# Patient Record
Sex: Male | Born: 1970 | Race: White | Hispanic: No | Smoking: Current every day smoker
Health system: Southern US, Community
[De-identification: ages and names within clinical notes are randomized; demographics above are authoritative.]

## PROBLEM LIST (undated history)

## (undated) DIAGNOSIS — I10 Essential (primary) hypertension: Secondary | ICD-10-CM

## (undated) DIAGNOSIS — I639 Cerebral infarction, unspecified: Secondary | ICD-10-CM

## (undated) HISTORY — PX: TONSILLECTOMY: SUR1361

## (undated) HISTORY — PX: HAND SURGERY: SHX662

---

## 2021-01-01 ENCOUNTER — Other Ambulatory Visit: Payer: Self-pay

## 2021-01-01 ENCOUNTER — Inpatient Hospital Stay (HOSPITAL_COMMUNITY)
Admission: EM | Admit: 2021-01-01 | Discharge: 2021-01-05 | DRG: 305 | Disposition: A | Discharge status: Home / Self Care | Payer: No Typology Code available for payment source | Attending: Internal Medicine | Admitting: Internal Medicine

## 2021-01-01 ENCOUNTER — Emergency Department (HOSPITAL_COMMUNITY): Payer: No Typology Code available for payment source

## 2021-01-01 ENCOUNTER — Encounter (HOSPITAL_COMMUNITY): Payer: Self-pay | Admitting: Internal Medicine

## 2021-01-01 DIAGNOSIS — Z79899 Other long term (current) drug therapy: Secondary | ICD-10-CM | POA: Diagnosis not present

## 2021-01-01 DIAGNOSIS — Z7289 Other problems related to lifestyle: Secondary | ICD-10-CM | POA: Diagnosis not present

## 2021-01-01 DIAGNOSIS — Z720 Tobacco use: Secondary | ICD-10-CM

## 2021-01-01 DIAGNOSIS — I169 Hypertensive crisis, unspecified: Secondary | ICD-10-CM | POA: Diagnosis not present

## 2021-01-01 DIAGNOSIS — F1990 Other psychoactive substance use, unspecified, uncomplicated: Secondary | ICD-10-CM

## 2021-01-01 DIAGNOSIS — T50916A Underdosing of multiple unspecified drugs, medicaments and biological substances, initial encounter: Secondary | ICD-10-CM | POA: Diagnosis present

## 2021-01-01 DIAGNOSIS — I159 Secondary hypertension, unspecified: Secondary | ICD-10-CM

## 2021-01-01 DIAGNOSIS — I4581 Long QT syndrome: Secondary | ICD-10-CM | POA: Diagnosis present

## 2021-01-01 DIAGNOSIS — F1090 Alcohol use, unspecified, uncomplicated: Secondary | ICD-10-CM

## 2021-01-01 DIAGNOSIS — I160 Hypertensive urgency: Secondary | ICD-10-CM | POA: Diagnosis not present

## 2021-01-01 DIAGNOSIS — I1A0 Resistant hypertension: Secondary | ICD-10-CM | POA: Insufficient documentation

## 2021-01-01 DIAGNOSIS — Z87448 Personal history of other diseases of urinary system: Secondary | ICD-10-CM

## 2021-01-01 DIAGNOSIS — F17210 Nicotine dependence, cigarettes, uncomplicated: Secondary | ICD-10-CM | POA: Diagnosis present

## 2021-01-01 DIAGNOSIS — Z833 Family history of diabetes mellitus: Secondary | ICD-10-CM

## 2021-01-01 DIAGNOSIS — Z20822 Contact with and (suspected) exposure to covid-19: Secondary | ICD-10-CM | POA: Diagnosis present

## 2021-01-01 DIAGNOSIS — I10 Essential (primary) hypertension: Secondary | ICD-10-CM | POA: Diagnosis present

## 2021-01-01 DIAGNOSIS — H532 Diplopia: Secondary | ICD-10-CM | POA: Diagnosis present

## 2021-01-01 DIAGNOSIS — I161 Hypertensive emergency: Principal | ICD-10-CM | POA: Diagnosis present

## 2021-01-01 DIAGNOSIS — R42 Dizziness and giddiness: Secondary | ICD-10-CM | POA: Diagnosis present

## 2021-01-01 DIAGNOSIS — Z91138 Patient's unintentional underdosing of medication regimen for other reason: Secondary | ICD-10-CM

## 2021-01-01 DIAGNOSIS — J449 Chronic obstructive pulmonary disease, unspecified: Secondary | ICD-10-CM | POA: Diagnosis present

## 2021-01-01 DIAGNOSIS — Z789 Other specified health status: Secondary | ICD-10-CM

## 2021-01-01 DIAGNOSIS — R202 Paresthesia of skin: Secondary | ICD-10-CM | POA: Diagnosis present

## 2021-01-01 DIAGNOSIS — Z8249 Family history of ischemic heart disease and other diseases of the circulatory system: Secondary | ICD-10-CM

## 2021-01-01 DIAGNOSIS — R112 Nausea with vomiting, unspecified: Secondary | ICD-10-CM | POA: Diagnosis present

## 2021-01-01 HISTORY — DX: Essential (primary) hypertension: I10

## 2021-01-01 LAB — COMPREHENSIVE METABOLIC PANEL
ALT: 34 U/L (ref 0–44)
AST: 27 U/L (ref 15–41)
Albumin: 4.2 g/dL (ref 3.5–5.0)
Alkaline Phosphatase: 80 U/L (ref 38–126)
Anion gap: 9 (ref 5–15)
BUN: 9 mg/dL (ref 6–20)
CO2: 24 mmol/L (ref 22–32)
Calcium: 9.4 mg/dL (ref 8.9–10.3)
Chloride: 103 mmol/L (ref 98–111)
Creatinine, Ser: 0.8 mg/dL (ref 0.61–1.24)
GFR, Estimated: >60 mL/min (ref >60)
Glucose, Bld: 117 mg/dL — ABNORMAL HIGH (ref 70–99)
Potassium: 4.2 mmol/L (ref 3.5–5.1)
Sodium: 136 mmol/L (ref 135–145)
Total Bilirubin: 0.7 mg/dL (ref 0.3–1.2)
Total Protein: 8.2 g/dL — ABNORMAL HIGH (ref 6.5–8.1)

## 2021-01-01 LAB — TSH: TSH: 0.825 u[IU]/mL (ref 0.350–4.500)

## 2021-01-01 LAB — RESP PANEL BY RT-PCR (FLU A&B, COVID) ARPGX2
Influenza A by PCR: NEGATIVE
Influenza B by PCR: NEGATIVE
SARS Coronavirus 2 by RT PCR: NEGATIVE

## 2021-01-01 LAB — CBC WITH DIFFERENTIAL/PLATELET
Abs Immature Granulocytes: 0.05 10*3/uL (ref 0.00–0.07)
Basophils Absolute: 0 10*3/uL (ref 0.0–0.1)
Basophils Relative: 0 %
Eosinophils Absolute: 0 10*3/uL (ref 0.0–0.5)
Eosinophils Relative: 0 %
HCT: 47.5 % (ref 39.0–52.0)
Hemoglobin: 16 g/dL (ref 13.0–17.0)
Immature Granulocytes: 1 %
Lymphocytes Relative: 17 %
Lymphs Abs: 1.7 10*3/uL (ref 0.7–4.0)
MCH: 30 pg (ref 26.0–34.0)
MCHC: 33.7 g/dL (ref 30.0–36.0)
MCV: 89 fL (ref 80.0–100.0)
Monocytes Absolute: 0.7 10*3/uL (ref 0.1–1.0)
Monocytes Relative: 7 %
Neutro Abs: 7.6 10*3/uL (ref 1.7–7.7)
Neutrophils Relative %: 75 %
Platelets: 279 10*3/uL (ref 150–400)
RBC: 5.34 MIL/uL (ref 4.22–5.81)
RDW: 13.6 % (ref 11.5–15.5)
WBC: 10.1 10*3/uL (ref 4.0–10.5)
nRBC: 0 % (ref 0.0–0.2)

## 2021-01-01 LAB — LIPID PANEL
Cholesterol: 197 mg/dL (ref 0–200)
HDL: 45 mg/dL (ref >40)
LDL Cholesterol: 113 mg/dL — ABNORMAL HIGH (ref 0–99)
Total CHOL/HDL Ratio: 4.4 RATIO
Triglycerides: 197 mg/dL — ABNORMAL HIGH (ref <150)
VLDL: 39 mg/dL (ref 0–40)

## 2021-01-01 LAB — ETHANOL: Alcohol, Ethyl (B): <10 mg/dL (ref <10)

## 2021-01-01 LAB — FOLATE: Folate: 11.6 ng/mL (ref >5.9)

## 2021-01-01 LAB — VITAMIN B12: Vitamin B-12: 335 pg/mL (ref 180–914)

## 2021-01-01 LAB — TROPONIN I (HIGH SENSITIVITY)
Troponin I (High Sensitivity): 11 ng/L (ref <18)
Troponin I (High Sensitivity): 10 ng/L (ref <18)

## 2021-01-01 LAB — HIV ANTIBODY (ROUTINE TESTING W REFLEX): HIV Screen 4th Generation wRfx: NONREACTIVE

## 2021-01-01 MED ORDER — LISINOPRIL 20 MG PO TABS
20.0000 mg | ORAL_TABLET | Freq: Every day | ORAL | Status: DC
  Administered 2021-01-02: 20 mg via ORAL
  Filled 2021-01-01: qty 1

## 2021-01-01 MED ORDER — HYDRALAZINE HCL 25 MG PO TABS: 25.0000 mg | ORAL_TABLET | ORAL | Status: DC | PRN

## 2021-01-01 MED ORDER — THIAMINE HCL 100 MG PO TABS
100.0000 mg | ORAL_TABLET | Freq: Every day | ORAL | Status: DC
  Administered 2021-01-01 – 2021-01-05 (×5): 100 mg via ORAL
  Filled 2021-01-01 (×5): qty 1

## 2021-01-01 MED ORDER — LABETALOL HCL 5 MG/ML IV SOLN: 10.0000 mg | INTRAVENOUS | Status: DC | PRN

## 2021-01-01 MED ORDER — CLONIDINE HCL 0.1 MG PO TABS
0.1000 mg | ORAL_TABLET | Freq: Once | ORAL | Status: AC
  Administered 2021-01-01: 0.1 mg via ORAL
  Filled 2021-01-01: qty 1

## 2021-01-01 MED ORDER — DIAZEPAM 5 MG/ML IJ SOLN
2.5000 mg | Freq: Once | INTRAMUSCULAR | Status: AC
  Administered 2021-01-01: 2.5 mg via INTRAVENOUS
  Filled 2021-01-01: qty 2

## 2021-01-01 MED ORDER — NICOTINE 21 MG/24HR TD PT24
21.0000 mg | MEDICATED_PATCH | Freq: Every day | TRANSDERMAL | Status: DC
  Administered 2021-01-01 – 2021-01-04 (×4): 21 mg via TRANSDERMAL
  Filled 2021-01-01 (×5): qty 1

## 2021-01-01 MED ORDER — ENOXAPARIN SODIUM 40 MG/0.4ML IJ SOSY
40.0000 mg | PREFILLED_SYRINGE | Freq: Every day | INTRAMUSCULAR | Status: DC
  Administered 2021-01-02 – 2021-01-05 (×4): 40 mg via SUBCUTANEOUS
  Filled 2021-01-01 (×4): qty 0.4

## 2021-01-01 MED ORDER — LORAZEPAM 1 MG PO TABS: 1.0000 mg | ORAL_TABLET | ORAL | Status: AC | PRN

## 2021-01-01 MED ORDER — HYDRALAZINE HCL 25 MG PO TABS
25.0000 mg | ORAL_TABLET | ORAL | Status: DC | PRN
  Administered 2021-01-02: 25 mg via ORAL

## 2021-01-01 MED ORDER — LABETALOL HCL 5 MG/ML IV SOLN
10.0000 mg | INTRAVENOUS | Status: DC | PRN
  Administered 2021-01-02: 10 mg via INTRAVENOUS
  Filled 2021-01-01: qty 4

## 2021-01-01 MED ORDER — LORAZEPAM 2 MG/ML IJ SOLN
1.0000 mg | INTRAMUSCULAR | Status: AC | PRN
Start: 2021-01-01 — End: 2021-01-04

## 2021-01-01 MED ORDER — THIAMINE HCL 100 MG/ML IJ SOLN: 100.0000 mg | Freq: Every day | INTRAMUSCULAR | Status: DC

## 2021-01-01 MED ORDER — ADULT MULTIVITAMIN W/MINERALS CH
1.0000 | ORAL_TABLET | Freq: Every day | ORAL | Status: DC
  Administered 2021-01-01 – 2021-01-05 (×5): 1 via ORAL
  Filled 2021-01-01 (×5): qty 1

## 2021-01-01 MED ORDER — ONDANSETRON HCL 4 MG/2ML IJ SOLN
4.0000 mg | Freq: Once | INTRAMUSCULAR | Status: AC
  Administered 2021-01-01: 4 mg via INTRAVENOUS
  Filled 2021-01-01: qty 2

## 2021-01-01 MED ORDER — FOLIC ACID 1 MG PO TABS
1.0000 mg | ORAL_TABLET | Freq: Every day | ORAL | Status: DC
  Administered 2021-01-01 – 2021-01-05 (×5): 1 mg via ORAL
  Filled 2021-01-01 (×5): qty 1

## 2021-01-01 MED ORDER — MECLIZINE HCL 25 MG PO TABS
25.0000 mg | ORAL_TABLET | Freq: Once | ORAL | Status: AC
  Administered 2021-01-01: 25 mg via ORAL
  Filled 2021-01-01: qty 1

## 2021-01-01 MED ORDER — LORAZEPAM 2 MG/ML IJ SOLN
1.0000 mg | Freq: Once | INTRAMUSCULAR | Status: AC
  Administered 2021-01-01: 1 mg via INTRAVENOUS
  Filled 2021-01-01: qty 1

## 2021-01-01 MED ORDER — SODIUM CHLORIDE 0.9% FLUSH
3.0000 mL | Freq: Two times a day (BID) | INTRAVENOUS | Status: DC
  Administered 2021-01-01 – 2021-01-05 (×8): 3 mL via INTRAVENOUS

## 2021-01-01 NOTE — Assessment & Plan Note (Addendum)
-   stroke ruled out; [facility] and MRI brain reassuring; symptoms considered 2/2 uncontrolled hypertension at this time.  Also does not appear to be intoxicated nor withdrawing - BPPV ruled out as well now; seen by vestibular PT on 5/2 - still describing vertigo on 5/2 and therefore case discussed with neurology; workup was considered thorough, they recommended outpatient neuro eval if symptoms still ongoing - B12 deficiency related? - on 5/3, he stated that his symptoms were 80% improved which was very reassuring; plan was to tentatively treat for possible vestibular neuritis with steroid course but since he's improving finally on his own, will hold off on steroid for now and monitor further (he declined a benzo b/c he didn't want to be "fuzzy headed" or slow)

## 2021-01-01 NOTE — ED Triage Notes (Addendum)
Pt came from home via EMS. N/V since last night, continued this AM. New severe dizziness today. Pt states "I was supposed to go to work today so I only drank a 6 pack last night"  254/124 systolic; pt states PMH of HTN but hasnt taken medicine for it in years CBG: 124 95% on RA; PMH of COPD and smoking 20 in Right hand  Per EMS; stroke screen is negative

## 2021-01-01 NOTE — Hospital Course (Signed)
[name] is a 50 year old male with PMH uncontrolled hypertension, COPD, ongoing tobacco use, ongoing alcohol use, and occasional illicit drug use.  He does not routinely follow with a provider outpatient and takes no medications at home. He presented to the ER with complaints of the room spinning that has not improved over the past 2 days.  He states that last night his symptoms became constant and would not go away and he has been staggering since. He states that he went to [employer] 2 days ago and felt dizzy initially at that time then went home and drank a sixpack because he felt wobbly.  Symptoms did not improve. He also has been complaining of double vision and pressure behind his right eye.  This morning he had nausea and multiple episodes of vomiting/dry heaving. He denies any associated chest pain, shortness of breath, headaches.  He endorses tingling sensations in his hands and fingers intermittently which have been going on a few months.  Denies any sensory changes in his legs nor any focal weakness anywhere.  He endorses smoking approximately 1.5 PPD the past 2 years but prior to that has smoked approximately 1 PPD for 40 years. Alcohol intake is on average of 6-pack Budweiser nightly but up to 12-pack at times. He occasionally endorses marijuana and meth use; last use was meth ~ 1 month ago.   On arrival in the ER his blood pressure was 231/145 and remained consistently elevated in this range. Due to his complaints of difficulty with balance he underwent stroke work-up as well. CT head was unremarkable and showed no acute abnormalities nor evidence of bleeding. MRI brain also unremarkable.  No cerebellar findings nor acute abnormalities to suggest stroke elsewhere.  No mention of cerebral atrophy either.  Does mention mild chronic small vessel ischemic changes. CXR was obtained and also unremarkable.  Notable labs include: Trop 11>>10 Ethanol <10 Na 136, K 4.2, Cl 103, CO2 24, Glucose  117, Creat 0.8, BUN 9 Liver enzymes normal. CBC also normal and unremarkable.  He was treated with clonidine, Valium, Ativan, meclizine, Zofran while in the ER.  Follow-up blood pressure after treatment was 178/111.  He is admitted for further blood pressure control and secondary work-up.

## 2021-01-01 NOTE — Progress Notes (Signed)
Report received from [facility] RN/ED. Pt arrived to room 1418 via stretcher and transferred seft to bed. Tele connected and box and pt info called to monitors. Call bell education initiated for falls/safety. Pt handbook given to pt.

## 2021-01-01 NOTE — ED Provider Notes (Signed)
[facility]-EMERGENCY DEPT Provider Note   CSN: [id number] Arrival date & time: 01/01/21  1046     History Chief Complaint  Patient presents with  . Emesis  . Hypertension    Alex CorneaWilliam Dewolfe is [name] 50 y.o. male with remote history of hypertension, daily EtOH use who presents for evaluation of dizziness and emesis.  Patient states 3 days ago he felt like he was staggering and had some dizziness.  He thought this was due to [facility]  Patient states he continues to have intermittent dizziness.  Post to work today so he only drink [name] sixpack last night which he states he normally drinks.  He denies any prior history of DTs or withdrawal seizures.  States he has remote history of hypertension however states his blood pressure was "normal" the last time he went and do not need any medication and has not been seen by PCP since.  He states his dizziness is currently severe.  Improves when he closes his eyes.  He has had 4-5 episodes of NBNB emesis today.  Currently smokes 1 pack of cigarettes daily.  History of COPD.  States he feels generally weak however no unilateral weakness.  Denies any blurred vision, paresthesias.  No chest pain, shortness of breath.  No fever, chills, abdominal pain, diarrhea or dysuria.  Denies additional aggravating or alleviating factors.  History obtained from patient and past medical records.  No interpreter used  HPI     No past medical history on file.  There are no problems to display for this patient.   History reviewed    No family history on file.     Home Medications Prior to Admission medications   Medication Sig Start Date End Date Taking? Authorizing Provider  acetaminophen (TYLENOL) 500 MG tablet Take 1,000 mg by mouth every 6 (six) hours as needed for mild pain or headache.   Yes [provider]    Allergies    Patient has no known allergies.  Review of Systems   Review of Systems   Constitutional: Negative.   HENT: Negative.   Respiratory: Negative.   Gastrointestinal: Positive for nausea and vomiting. Negative for abdominal distention, abdominal pain, anal bleeding, blood in stool, constipation, diarrhea and rectal pain.  Genitourinary: Negative.   Musculoskeletal: Negative.   Skin: Negative.   Neurological: Positive for dizziness, weakness, light-headedness and headaches. Negative for tremors, seizures, syncope, facial asymmetry, speech difficulty and numbness.  All other systems reviewed and are negative.   Physical Exam Updated Vital Signs BP (!) 189/113   Pulse 81   Temp (!) 97.4 F (36.3 C) (Oral)   Resp 19   Ht 5\' 11"  (1.803 m)   Wt 96.6 kg   SpO2 94%   BMI 29.71 kg/m   Physical Exam Physical Exam  Constitutional: Pt is oriented to person, place, and time. Pt appears well-developed and well-nourished. No distress.  HENT:  Head: Normocephalic and atraumatic.  Mouth/Throat: Oropharynx is clear and moist.  Eyes: Conjunctivae and EOM are normal. Pupils are equal, round, and reactive to light. No scleral icterus.  No horizontal, vertical or rotational nystagmus  Neck: Normal range of motion. Neck supple.  Full active and passive ROM without pain No midline or paraspinal tenderness No nuchal rigidity or meningeal signs  Cardiovascular: Normal rate, regular rhythm and intact distal pulses.   Pulmonary/Chest: Effort normal and breath sounds normal. No respiratory distress. Pt has no wheezes. No rales.  Abdominal: Soft.  Bowel sounds are normal. There is no tenderness. There is no rebound and no guarding.  Musculoskeletal: Normal range of motion.  Lymphadenopathy:    No cervical adenopathy.  Neurological: Pt. is alert and oriented to person, place, and time. He has normal reflexes. No cranial nerve deficit.  Exhibits normal muscle tone. Coordination normal.  Mental Status:  Alert, oriented, thought content appropriate. Speech fluent without evidence  of aphasia. Able to follow 2 step commands without difficulty.  Cranial Nerves:  II:  Peripheral visual fields grossly normal, pupils equal, round, reactive to light III,IV, VI: ptosis not present, extra-ocular motions intact bilaterally  V,VII: smile symmetric, facial light touch sensation equal VIII: hearing grossly normal bilaterally  IX,X: midline uvula rise  XI: bilateral shoulder shrug equal and strong XII: midline tongue extension  Motor:  5/5 in upper and lower extremities bilaterally including strong and equal grip strength and dorsiflexion/plantar flexion Sensory: Pinprick and light touch normal in all extremities.  Deep Tendon Reflexes: 2+ and symmetric  Cerebellar: normal finger-to-nose with bilateral upper extremities Gait: normal gait and balance CV: distal pulses palpable throughout   Skin: Skin is warm and dry. No rash noted. Pt is not diaphoretic.  Psychiatric: Pt has [name] normal mood and affect. Behavior is normal. Judgment and thought content normal.  Nursing note and vitals reviewed. ED Results / Procedures / Treatments   Labs (all labs ordered are listed, but only abnormal results are displayed) Labs Reviewed  COMPREHENSIVE METABOLIC PANEL - Abnormal; Notable for the following components:      Result Value   Glucose, Bld 117 (*)    Total Protein 8.2 (*)    All other components within normal limits  RESP PANEL BY RT-PCR (FLU [name]&B, COVID) ARPGX2  CBC WITH DIFFERENTIAL/PLATELET  ETHANOL  TROPONIN I (HIGH SENSITIVITY)  TROPONIN I (HIGH SENSITIVITY)    EKG EKG Interpretation  Date/Time:  Saturday January 01 2021 11:16:09 EDT Ventricular Rate:  72 PR Interval:  138 QRS Duration: 104 QT Interval:  480 QTC Calculation: 525 R Axis:   53 Text Interpretation: Normal sinus rhythm Septal infarct , age undetermined T wave abnormality, consider lateral ischemia Prolonged QT Abnormal ECG Confirmed by Kristine Royal [phone]) on 01/01/2021 11:44:23 AM   Radiology CT Head  Wo Contrast  Result Date: 01/01/2021 CLINICAL DATA:  Dizziness, nausea, vomiting since last night EXAM: CT HEAD WITHOUT CONTRAST TECHNIQUE: Contiguous axial images were obtained from the base of the skull through the vertex without intravenous contrast. COMPARISON:  None. FINDINGS: Brain: No evidence of acute infarction, hemorrhage, hydrocephalus, extra-axial collection or mass lesion/mass effect. Vascular: No hyperdense vessel or unexpected calcification. Skull: No osseous abnormality. Sinuses/Orbits: Small right maxillary sinus mucous retention cyst. Visualized mastoid sinuses are clear. Visualized orbits demonstrate no focal abnormality. Other: None IMPRESSION: No acute intracranial pathology. Electronically Signed   By: Elige Ko   On: 01/01/2021 12:28   MR BRAIN WO CONTRAST  Result Date: 01/01/2021 CLINICAL DATA:  Nonspecific dizziness. EXAM: MRI HEAD WITHOUT CONTRAST TECHNIQUE: Multiplanar, multiecho pulse sequences of the brain and surrounding structures were obtained without intravenous contrast. COMPARISON:  Head CT same day FINDINGS: Brain: Diffusion imaging does not show any acute or subacute infarction. No focal abnormality affects the brainstem. No focal cerebellar finding. Cerebral hemispheres show scattered punctate foci of T2 and FLAIR signal within the white matter consistent with minimal small vessel change. No cortical or large vessel territory infarction. No intra-axial mass lesion, hemorrhage, hydrocephalus or extra-axial collection. Vascular: Normal Skull and upper  cervical spine: Normal appearance of the bones. 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 Sinuses/Orbits: Clear/normal Other: No fluid in the middle ears or mastoids. IMPRESSION: No abnormality seen to explain dizziness. No acute finding. Mild chronic small-vessel ischemic change of the hemispheric white matter. Electronically Signed   By: Paulina Fusi M.D.   On: 01/01/2021 16:27    DG Chest Portable 1 View  Result Date: 01/01/2021 CLINICAL DATA:  Shortness of breath.  Nausea vomiting. EXAM: PORTABLE CHEST 1 VIEW COMPARISON:  None. FINDINGS: The heart size and mediastinal contours are within normal limits. Both lungs are clear. The visualized skeletal structures are unremarkable. IMPRESSION: No active disease. Electronically Signed   By: Gerome Sam III M.D   On: 01/01/2021 14:14    Procedures .Critical Care Performed by: Linwood Dibbles, PA-C Authorized by: Linwood Dibbles, PA-C   Critical care provider statement:    Critical care time (minutes):  45   Critical care was necessary to treat or prevent imminent or life-threatening deterioration of the following conditions:  Circulatory failure and CNS failure or compromise   Critical care was time spent personally by me on the following activities:  Discussions with consultants, evaluation of patient's response to treatment, examination of patient, ordering and performing treatments and interventions, ordering and review of laboratory studies, ordering and review of radiographic studies, pulse oximetry, re-evaluation of patient's condition, obtaining history from patient or surrogate and review of old charts     Medications Ordered in ED Medications  diazepam (VALIUM) injection 2.5 mg (has no administration in time range)  cloNIDine (CATAPRES) tablet 0.1 mg (has no administration in time range)  ondansetron (ZOFRAN) injection 4 mg (4 mg Intravenous Given 01/01/21 1127)  LORazepam (ATIVAN) injection 1 mg (1 mg Intravenous Given 01/01/21 1247)  meclizine (ANTIVERT) tablet 25 mg (25 mg Oral Given 01/01/21 1545)   ED Course  I have reviewed the triage vital signs and the nursing notes.  Pertinent labs & imaging results that were available during my care of the patient were reviewed by me and considered in my medical decision making (see chart for details).  50 year old presents for evaluation of dizziness and  emesis.  He is significantly hypertensive on arrival.  Does have chronic EtOH use, drinks [name] sixpack daily.  Last drink yesterday.  He does not appear to be in active withdrawal at this time.  His heart and lungs are clear.  His abdomen soft, nontender.  He does admit to "abnormal walking" over the last few days.  He has [name] nonfocal neuro exam here.  Patient is not [name] code stroke, no evidence of LVO.  We will plan on labs, imaging and reassess, hold on antihypertensive until imaging returns  Labs and imaging personally reviewed and interpreted:  CBC without leukocytosis Metabolic panel glucose 117, no additional electrolyte, renal or liver abnormality Ethanol less than 10 Troponin 11 CT head without any significant findings DG chest without acute findings MR brain wo acute findings EKG without significant finding  Patient reassessed.  Nausea improved however still feels significantly dizzy. BP improved with Ativan. Does have prolonged QT interval. Will need to hold on additional Zofran, Phenergan.  MR pending.  Had some nausea without any emesis.  Was given meclizine which did help with his dizziness.   Reassessed. I attempted to ambulate patient and patient unable to ambulate, barely able to stand on side of bed without grabbing onto things due to dizziness, began dry  heaving.  Blood pressure started trending back up again.  We will treat his dizziness, blood pressure.  Suspect patient's dizziness from his hypertension.  He does not appear to be withdrawing from alcohol at this time. MR reassuring  CONSULT with Dr. Frederick Peers with [facility] who agrees to evaluate patient for admission.  The patient appears reasonably stabilized for admission considering the current resources, flow, and capabilities available in the ED at this time, and I doubt any other [employer] requiring further screening and/or treatment in the ED prior to admission.  Patient discussed with attending, Dr. Rodena Medin who agrees above treatment,  plan disposition      MDM Rules/Calculators/[name]&P                           Final Clinical Impression(s) / ED Diagnoses Final diagnoses:  Dizziness  Alcohol use  Hypertensive crisis    Rx / DC Orders ED Discharge Orders    None       Alex Kedzierski A, PA-C 01/01/21 1808    Wynetta Fines, MD 01/03/21 [phone]

## 2021-01-01 NOTE — Assessment & Plan Note (Signed)
-    Average 1.5 PPD (aprox 40 pack year history) - nicotine patch ordered

## 2021-01-01 NOTE — Assessment & Plan Note (Addendum)
-   Last drink 8 PM 12/31/2020, [employer]. -Average 6-pack beer nightly but up to 12 pack at times -Monitor on CIWA - Check folate (11), B12 (335) levels  - Continue thiamine, folate, multivitamin -B12 shot x1 today again then oral daily replacement

## 2021-01-01 NOTE — Plan of Care (Signed)
POC initiated 

## 2021-01-01 NOTE — H&P (Signed)
History and Physical    Alex [name]  KGY:[id number]  DOB: 1971-03-29  DOA: 01/01/2021  PCP: Pcp, No Patient coming from: home  Chief Complaint: home  HPI:  Alex [name] is a 50 year old male with PMH uncontrolled hypertension, COPD, ongoing tobacco use, ongoing alcohol use, and occasional illicit drug use.  He does not routinely follow with a provider outpatient and takes no medications at home. He presented to the ER with complaints of the room spinning that has not improved over the past 2 days.  He states that last night his symptoms became constant and would not go away and he has been staggering since. He states that he went to [employer] 2 days ago and felt dizzy initially at that time then went home and drank a sixpack because he felt wobbly.  Symptoms did not improve. He also has been complaining of double vision and pressure behind his right eye.  This morning he had nausea and multiple episodes of vomiting/dry heaving. He denies any associated chest pain, shortness of breath, headaches.  He endorses tingling sensations in his hands and fingers intermittently which have been going on a few months.  Denies any sensory changes in his legs nor any focal weakness anywhere.  He endorses smoking approximately 1.5 PPD the past 2 years but prior to that has smoked approximately 1 PPD for 40 years. Alcohol intake is on average of 6-pack Budweiser nightly but up to 12-pack at times. He occasionally endorses marijuana and meth use; last use was meth ~ 1 month ago.   On arrival in the ER his blood pressure was 231/145 and remained consistently elevated in this range. Due to his complaints of difficulty with balance he underwent stroke work-up as well. CT head was unremarkable and showed no acute abnormalities nor evidence of bleeding. MRI brain also unremarkable.  No cerebellar findings nor acute abnormalities to suggest stroke elsewhere.  No mention of cerebral atrophy either.  Does mention mild  chronic small vessel ischemic changes. CXR was obtained and also unremarkable.  Notable labs include: Trop 11>>10 Ethanol <10 Na 136, K 4.2, Cl 103, CO2 24, Glucose 117, Creat 0.8, BUN 9 Liver enzymes normal. CBC also normal and unremarkable.  He was treated with clonidine, Valium, Ativan, meclizine, Zofran while in the ER.  Follow-up blood pressure after treatment was 178/111.  He is admitted for further blood pressure control and secondary work-up.   I have personally briefly reviewed patient's old medical records in [facility] and discussed patient with the ER provider when appropriate/indicated.  Assessment/Plan: * Hypertensive urgency - BP on presentation 231/145 which was repeated and remained within that range.  Goal BP lowering approximately 25-30% by tonight (target SBP ~160-170) then will further lower tomorrow - use labetalol or hydralazine (parameters ordered). If further agents needed, would then initiate clonidine.  If refractory may require nicardipine drip; appears stable for now so will admit to progressive but if BP uncontrollable to withdrawal difficult to treat then low threshold to transfer to SDU - see HTN for further workup - check TSH  HTN (hypertension) - Patient has known history but states he has not been on medications for quite some time - EKG shows borderline LVH.  Will obtain echo to further evaluate given longstanding uncontrolled hypertension -Regimen pending his blood pressure response.  Will likely start with lisinopril - check lipid, TSH, A1c  Vertigo - stroke ruled out; [facility] and MRI brain reassuring; symptoms considered 2/2 uncontrolled hypertension at this time.  Also  does not appear to be intoxicated nor withdrawing  Substance use - Patient endorses occasional marijuana and meth use.  Last use was meth approximately 1 month ago - Patient has been counseled about cessation bedside  Alcohol use - Last drink 8 PM 12/31/2020,  Budweiser. -Average 6-pack beer nightly but up to 12 pack at times -Monitor on CIWA - Check folate, B12 levels  - Continue thiamine, folate, multivitamin  Tobacco use -  Average 1.5 PPD (aprox 40 pack year history) - nicotine patch ordered     Code Status:    Code Status: Full Code  DVT Prophylaxis:   enoxaparin (LOVENOX) injection 40 mg Start: 01/02/21 1000   Anticipated disposition is to: Home  History: Past Medical History:  Diagnosis Date  . HTN (hypertension)     History reviewed. No pertinent surgical history.   reports that he has been smoking cigarettes. He has a 40.00 pack-year smoking history. He does not have any smokeless tobacco history on file. He reports current alcohol use of about 6.0 - 12.0 standard drinks of alcohol per week. He reports current drug use. Drugs: Methamphetamines and Marijuana.  No Known Allergies  Family History  Problem Relation Age of Onset  . Diabetes Mother   . Heart disease Mother   . Heart disease Father   . Hypertension Father    Home Medications: Prior to Admission medications   Medication Sig Start Date End Date Taking? Authorizing Provider  acetaminophen (TYLENOL) 500 MG tablet Take 1,000 mg by mouth every 6 (six) hours as needed for mild pain or headache.   Yes [provider]    Review of Systems:  Pertinent items noted in HPI and remainder of comprehensive ROS otherwise negative.  Physical Exam: Vitals:   01/01/21 1643 01/01/21 1645 01/01/21 1701 01/01/21 1707  BP: (!) 199/110 (!) 189/113 (!) 178/111 (!) 178/111  Pulse:  81  77  Resp:  19    Temp:      TempSrc:      SpO2:  94%    Weight:      Height:       General appearance: alert, cooperative and no distress Head: Normocephalic, without obvious abnormality, atraumatic Eyes: EOMI, PERRL Lungs: clear to auscultation bilaterally Heart: regular rate and rhythm and S1, S2 normal Abdomen: normal findings: bowel sounds normal and soft,  non-tender Extremities: no edema Skin: mobility and turgor normal Neurologic: No focal weakness.  Some paresthesia appreciated in right hand (radial distribution) and patient also complained of paresthesia in left leg.  No dysmetria or dysdiadochokinesia.  Gait deferred.  Labs on Admission:  I have personally reviewed following labs and imaging studies Results for orders placed or performed during the hospital encounter of 01/01/21 (from the past 24 hour(s))  CBC with Differential     Status: None   Collection Time: 01/01/21 11:31 AM  Result Value Ref Range   WBC 10.1 4.0 - 10.5 K/uL   RBC 5.34 4.22 - 5.81 MIL/uL   Hemoglobin 16.0 13.0 - 17.0 g/dL   HCT 97.6 73.4 - 19.3 %   MCV 89.0 80.0 - 100.0 fL   MCH 30.0 26.0 - 34.0 pg   MCHC 33.7 30.0 - 36.0 g/dL   RDW 79.0 24.0 - 97.3 %   Platelets 279 150 - 400 K/uL   nRBC 0.0 0.0 - 0.2 %   Neutrophils Relative % 75 %   Neutro Abs 7.6 1.7 - 7.7 K/uL   Lymphocytes Relative 17 %   Lymphs  Abs 1.7 0.7 - 4.0 K/uL   Monocytes Relative 7 %   Monocytes Absolute 0.7 0.1 - 1.0 K/uL   Eosinophils Relative 0 %   Eosinophils Absolute 0.0 0.0 - 0.5 K/uL   Basophils Relative 0 %   Basophils Absolute 0.0 0.0 - 0.1 K/uL   Immature Granulocytes 1 %   Abs Immature Granulocytes 0.05 0.00 - 0.07 K/uL  Comprehensive metabolic panel     Status: Abnormal   Collection Time: 01/01/21 11:31 AM  Result Value Ref Range   Sodium 136 135 - 145 mmol/L   Potassium 4.2 3.5 - 5.1 mmol/L   Chloride 103 98 - 111 mmol/L   CO2 24 22 - 32 mmol/L   Glucose, Bld 117 (H) 70 - 99 mg/dL   BUN 9 6 - 20 mg/dL   Creatinine, Ser 0.86 0.61 - 1.24 mg/dL   Calcium 9.4 8.9 - 57.8 mg/dL   Total Protein 8.2 (H) 6.5 - 8.1 g/dL   Albumin 4.2 3.5 - 5.0 g/dL   AST 27 15 - 41 U/L   ALT 34 0 - 44 U/L   Alkaline Phosphatase 80 38 - 126 U/L   Total Bilirubin 0.7 0.3 - 1.2 mg/dL   GFR, Estimated >46 >96 mL/min   Anion gap 9 5 - 15  Troponin I (High Sensitivity)     Status: None    Collection Time: 01/01/21 11:31 AM  Result Value Ref Range   Troponin I (High Sensitivity) 11 <18 ng/L  Ethanol     Status: None   Collection Time: 01/01/21 11:31 AM  Result Value Ref Range   Alcohol, Ethyl (B) <10 <10 mg/dL  Troponin I (High Sensitivity)     Status: None   Collection Time: 01/01/21 12:54 PM  Result Value Ref Range   Troponin I (High Sensitivity) 10 <18 ng/L  Resp Panel by RT-PCR (Flu A&B, Covid) Nasopharyngeal Swab     Status: None   Collection Time: 01/01/21  5:06 PM   Specimen: Nasopharyngeal Swab; Nasopharyngeal(NP) swabs in vial transport medium  Result Value Ref Range   SARS Coronavirus 2 by RT PCR NEGATIVE NEGATIVE   Influenza A by PCR NEGATIVE NEGATIVE   Influenza B by PCR NEGATIVE NEGATIVE     Radiological Exams on Admission: CT Head Wo Contrast  Result Date: 01/01/2021 CLINICAL DATA:  Dizziness, nausea, vomiting since last night EXAM: CT HEAD WITHOUT CONTRAST TECHNIQUE: Contiguous axial images were obtained from the base of the skull through the vertex without intravenous contrast. COMPARISON:  None. FINDINGS: Brain: No evidence of acute infarction, hemorrhage, hydrocephalus, extra-axial collection or mass lesion/mass effect. Vascular: No hyperdense vessel or unexpected calcification. Skull: No osseous abnormality. Sinuses/Orbits: Small right maxillary sinus mucous retention cyst. Visualized mastoid sinuses are clear. Visualized orbits demonstrate no focal abnormality. Other: None IMPRESSION: No acute intracranial pathology. Electronically Signed   By: Elige Ko   On: 01/01/2021 12:28   MR BRAIN WO CONTRAST  Result Date: 01/01/2021 CLINICAL DATA:  Nonspecific dizziness. EXAM: MRI HEAD WITHOUT CONTRAST TECHNIQUE: Multiplanar, multiecho pulse sequences of the brain and surrounding structures were obtained without intravenous contrast. COMPARISON:  Head CT same day FINDINGS: Brain: Diffusion imaging does not show any acute or subacute infarction. No focal  abnormality affects the brainstem. No focal cerebellar finding. Cerebral hemispheres show scattered punctate foci of T2 and FLAIR signal within the white matter consistent with minimal small vessel change. No cortical or large vessel territory infarction. No intra-axial mass lesion, hemorrhage, hydrocephalus or extra-axial collection.  Vascular: Normal Skull and upper cervical spine: Normal appearance of the bones. 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 Sinuses/Orbits: Clear/normal Other: No fluid in the middle ears or mastoids. IMPRESSION: No abnormality seen to explain dizziness. No acute finding. Mild chronic small-vessel ischemic change of the hemispheric white matter. Electronically Signed   By: Paulina FusiMark  Shogry M.D.   On: 01/01/2021 16:27   DG Chest Portable 1 View  Result Date: 01/01/2021 CLINICAL DATA:  Shortness of breath.  Nausea vomiting. EXAM: PORTABLE CHEST 1 VIEW COMPARISON:  None. FINDINGS: The heart size and mediastinal contours are within normal limits. Both lungs are clear. The visualized skeletal structures are unremarkable. IMPRESSION: No active disease. Electronically Signed   By: Gerome Samavid  Williams III M.D   On: 01/01/2021 14:14   MR BRAIN WO CONTRAST  Final Result    DG Chest Portable 1 View  Final Result    CT Head Wo Contrast  Final Result      Consults called:  n/a   EKG: Independently reviewed. NSR, QTc 525. Essentially LVH by Sokolow-Lyon criteria. T wave inversion aVL, V3   Lewie Chamberavid Blakley Michna, MD [facility] 01/01/2021, 6:06 PM

## 2021-01-01 NOTE — ED Notes (Signed)
Pt to MRI

## 2021-01-01 NOTE — Assessment & Plan Note (Addendum)
-   BP on presentation 231/145 which was repeated and remained within that range.  - BP has lowered at good rate - will target further deescalation to normal parameters - BP remains refractory some but trend is coming down - see HTN - check TSH (normal)

## 2021-01-01 NOTE — Assessment & Plan Note (Addendum)
-   Patient has known history but states he has not been on medications for quite some time - EKG shows borderline LVH.  Echo shows mild LVH. EF 60-65%, Gr II DD - continuing to adjust his BP regimen; still not controlled - continue lisinopril 40 mg, amlodipine 10 mg, hctz to 50 mg today - still not at goal; starting hydralazine 25 mg TID on 5/3; monitor further overnight; goal is discharge home Wednesday - would encourage outpt sleep study thru the [place] also

## 2021-01-01 NOTE — Assessment & Plan Note (Signed)
-   Patient endorses occasional marijuana and meth use.  Last use was meth approximately 1 month ago - Patient has been counseled about cessation bedside

## 2021-01-02 ENCOUNTER — Inpatient Hospital Stay (HOSPITAL_COMMUNITY): Payer: No Typology Code available for payment source

## 2021-01-02 DIAGNOSIS — I10 Essential (primary) hypertension: Secondary | ICD-10-CM

## 2021-01-02 LAB — ECHOCARDIOGRAM COMPLETE
Area-P 1/2: 2.73 cm2
Height: 71 in
S' Lateral: 3.6 cm
Weight: 3408 oz

## 2021-01-02 LAB — CBC WITH DIFFERENTIAL/PLATELET
Abs Immature Granulocytes: 0.03 10*3/uL (ref 0.00–0.07)
Basophils Absolute: 0.1 10*3/uL (ref 0.0–0.1)
Basophils Relative: 1 %
Eosinophils Absolute: 0.1 10*3/uL (ref 0.0–0.5)
Eosinophils Relative: 1 %
HCT: 43.3 % (ref 39.0–52.0)
Hemoglobin: 14.7 g/dL (ref 13.0–17.0)
Immature Granulocytes: 0 %
Lymphocytes Relative: 34 %
Lymphs Abs: 3.1 10*3/uL (ref 0.7–4.0)
MCH: 30.2 pg (ref 26.0–34.0)
MCHC: 33.9 g/dL (ref 30.0–36.0)
MCV: 89.1 fL (ref 80.0–100.0)
Monocytes Absolute: 0.6 10*3/uL (ref 0.1–1.0)
Monocytes Relative: 6 %
Neutro Abs: 5.2 10*3/uL (ref 1.7–7.7)
Neutrophils Relative %: 58 %
Platelets: 260 10*3/uL (ref 150–400)
RBC: 4.86 MIL/uL (ref 4.22–5.81)
RDW: 13.7 % (ref 11.5–15.5)
WBC: 9.1 10*3/uL (ref 4.0–10.5)
nRBC: 0 % (ref 0.0–0.2)

## 2021-01-02 LAB — BASIC METABOLIC PANEL
Anion gap: 8 (ref 5–15)
BUN: 13 mg/dL (ref 6–20)
CO2: 25 mmol/L (ref 22–32)
Calcium: 9.3 mg/dL (ref 8.9–10.3)
Chloride: 103 mmol/L (ref 98–111)
Creatinine, Ser: 0.89 mg/dL (ref 0.61–1.24)
GFR, Estimated: >60 mL/min (ref >60)
Glucose, Bld: 116 mg/dL — ABNORMAL HIGH (ref 70–99)
Potassium: 3.9 mmol/L (ref 3.5–5.1)
Sodium: 136 mmol/L (ref 135–145)

## 2021-01-02 LAB — MAGNESIUM: Magnesium: 2.3 mg/dL (ref 1.7–2.4)

## 2021-01-02 MED ORDER — HYDRALAZINE HCL 25 MG PO TABS
25.0000 mg | ORAL_TABLET | ORAL | Status: DC | PRN
  Administered 2021-01-02 – 2021-01-03 (×5): 25 mg via ORAL
  Filled 2021-01-02 (×6): qty 1

## 2021-01-02 MED ORDER — VITAMIN B-12 1000 MCG PO TABS
1000.0000 ug | ORAL_TABLET | Freq: Every day | ORAL | Status: DC
  Administered 2021-01-03: 1000 ug via ORAL
  Filled 2021-01-02: qty 1

## 2021-01-02 MED ORDER — CYANOCOBALAMIN 1000 MCG/ML IJ SOLN
1000.0000 ug | Freq: Once | INTRAMUSCULAR | Status: AC
  Administered 2021-01-02: 1000 ug via INTRAMUSCULAR
  Filled 2021-01-02: qty 1

## 2021-01-02 MED ORDER — AMLODIPINE BESYLATE 10 MG PO TABS
10.0000 mg | ORAL_TABLET | Freq: Once | ORAL | Status: AC
  Administered 2021-01-02: 10 mg via ORAL
  Filled 2021-01-02: qty 1

## 2021-01-02 MED ORDER — LISINOPRIL 20 MG PO TABS
20.0000 mg | ORAL_TABLET | Freq: Once | ORAL | Status: AC
  Administered 2021-01-02: 20 mg via ORAL
  Filled 2021-01-02: qty 1

## 2021-01-02 MED ORDER — LABETALOL HCL 5 MG/ML IV SOLN
10.0000 mg | INTRAVENOUS | Status: DC | PRN
  Administered 2021-01-02 – 2021-01-04 (×7): 10 mg via INTRAVENOUS
  Filled 2021-01-02 (×7): qty 4

## 2021-01-02 MED ORDER — HYDROCHLOROTHIAZIDE 25 MG PO TABS
25.0000 mg | ORAL_TABLET | Freq: Every day | ORAL | Status: DC
  Administered 2021-01-02: 25 mg via ORAL
  Filled 2021-01-02: qty 1

## 2021-01-02 MED ORDER — LISINOPRIL 20 MG PO TABS
40.0000 mg | ORAL_TABLET | Freq: Every day | ORAL | Status: DC
  Administered 2021-01-03 – 2021-01-05 (×3): 40 mg via ORAL
  Filled 2021-01-02 (×3): qty 2

## 2021-01-02 NOTE — Progress Notes (Signed)
Dr Toniann Fail notified of Pt's BP, medication given and follow up BP. Clarified giving apresoline 25mg  PO in addition.

## 2021-01-02 NOTE — Progress Notes (Signed)
  Echocardiogram 2D Echocardiogram has been performed.  Alex [name] 01/02/2021, 10:37 AM

## 2021-01-02 NOTE — Progress Notes (Signed)
Progress Note    Alex [name]   CWC:[id number]  DOB: Apr 29, 1971  DOA: 01/01/2021     1  PCP: Pcp, No  CC: dizzy  Hospital Course: Alex [name] is a 50 year old male with PMH uncontrolled hypertension, COPD, ongoing tobacco use, ongoing alcohol use, and occasional illicit drug use.  He does not routinely follow with a provider outpatient and takes no medications at home. He presented to the ER with complaints of the room spinning that has not improved over the past 2 days.  He states that last night his symptoms became constant and would not go away and he has been staggering since. He states that he went to [employer] 2 days ago and felt dizzy initially at that time then went home and drank a sixpack because he felt wobbly.  Symptoms did not improve. He also has been complaining of double vision and pressure behind his right eye.  This morning he had nausea and multiple episodes of vomiting/dry heaving. He denies any associated chest pain, shortness of breath, headaches.  He endorses tingling sensations in his hands and fingers intermittently which have been going on a few months.  Denies any sensory changes in his legs nor any focal weakness anywhere.  He endorses smoking approximately 1.5 PPD the past 2 years but prior to that has smoked approximately 1 PPD for 40 years. Alcohol intake is on average of 6-pack Budweiser nightly but up to 12-pack at times. He occasionally endorses marijuana and meth use; last use was meth ~ 1 month ago.   On arrival in the ER his blood pressure was 231/145 and remained consistently elevated in this range. Due to his complaints of difficulty with balance he underwent stroke work-up as well. CT head was unremarkable and showed no acute abnormalities nor evidence of bleeding. MRI brain also unremarkable.  No cerebellar findings nor acute abnormalities to suggest stroke elsewhere.  No mention of cerebral atrophy either.  Does mention mild chronic small vessel  ischemic changes. CXR was obtained and also unremarkable.  Notable labs include: Trop 11>>10 Ethanol <10 Na 136, K 4.2, Cl 103, CO2 24, Glucose 117, Creat 0.8, BUN 9 Liver enzymes normal. CBC also normal and unremarkable.  He was treated with clonidine, Valium, Ativan, meclizine, Zofran while in the ER.  Follow-up blood pressure after treatment was 178/111.  He is admitted for further blood pressure control and secondary work-up.   Interval History:  No events overnight.  Still dizzy this morning while laying in bed.  No other changes in symptoms or no new symptoms.  ROS: Constitutional: negative for chills and fevers, Respiratory: negative for cough, Cardiovascular: negative for chest pain and Gastrointestinal: negative for abdominal pain  Assessment & Plan: * Hypertensive urgency - BP on presentation 231/145 which was repeated and remained within that range.  - BP has lowered at good rate - will target further deescalation today to near normal range - continue lisinopril - hydralazine and labetalol PRN (parameters adjusted) - see HTN for further workup - check TSH (normal)  HTN (hypertension) - Patient has known history but states he has not been on medications for quite some time - EKG shows borderline LVH.  Will obtain echo to further evaluate given longstanding uncontrolled hypertension -Regimen pending his blood pressure response.  Will likely start with lisinopril  Vertigo - stroke ruled out; Alex [name] and MRI brain reassuring; symptoms considered 2/2 uncontrolled hypertension at this time.  Also does not appear to be intoxicated nor withdrawing -Other considered etiology  may be BPPV or similar; will consult PT as well  Substance use - Patient endorses occasional marijuana and meth use.  Last use was meth approximately 1 month ago - Patient has been counseled about cessation bedside  Alcohol use - Last drink 8 PM 12/31/2020, Budweiser. -Average 6-pack beer nightly but up  to 12 pack at times -Monitor on CIWA - Check folate (11), B12 (335) levels  - Continue thiamine, folate, multivitamin -B12 shot x1 today then oral daily replacement  Tobacco use -  Average 1.5 PPD (aprox 40 pack year history) - nicotine patch ordered    Old records reviewed in assessment of this patient  Antimicrobials: n/a  DVT prophylaxis: enoxaparin (LOVENOX) injection 40 mg Start: 01/02/21 1000   Code Status:   Code Status: Full Code Family Communication:   Disposition Plan: Status is: Inpatient  Remains inpatient appropriate because:IV treatments appropriate due to intensity of illness or inability to take PO and Inpatient level of care appropriate due to severity of illness   Dispo: The patient is from: Home              Anticipated d/c is to: Home              Patient currently is not medically stable to d/c.   Difficult to place patient No  Risk of unplanned readmission score: Unplanned Admission- Pilot do not use: 10.6   Objective: Blood pressure (!) 172/105, pulse 84, temperature 98.6 F (37 C), temperature source Oral, resp. rate 18, height 5\' 11"  (1.803 m), weight 96.6 kg, SpO2 96 %.  Examination: General appearance: alert, cooperative and no distress Head: Normocephalic, without obvious abnormality, atraumatic Eyes: EOMI, PERRL Lungs: clear to auscultation bilaterally Heart: regular rate and rhythm and S1, S2 normal Abdomen: normal findings: bowel sounds normal and soft, non-tender Extremities: no edema Skin: mobility and turgor normal Neurologic: No focal weakness.  Some paresthesia appreciated in right hand (radial distribution) and patient also complained of paresthesia in left leg.  No dysmetria or dysdiadochokinesia.  Gait deferred.  Consultants:   PT vestibular eval  Procedures:     Data Reviewed: I have personally reviewed following labs and imaging studies Results for orders placed or performed during the hospital encounter of 01/01/21  (from the past 24 hour(s))  CBC with Differential     Status: None   Collection Time: 01/01/21 11:31 AM  Result Value Ref Range   WBC 10.1 4.0 - 10.5 K/uL   RBC 5.34 4.22 - 5.81 MIL/uL   Hemoglobin 16.0 13.0 - 17.0 g/dL   HCT 01/03/21 56.9 - 79.4 %   MCV 89.0 80.0 - 100.0 fL   MCH 30.0 26.0 - 34.0 pg   MCHC 33.7 30.0 - 36.0 g/dL   RDW 80.1 65.5 - 37.4 %   Platelets 279 150 - 400 K/uL   nRBC 0.0 0.0 - 0.2 %   Neutrophils Relative % 75 %   Neutro Abs 7.6 1.7 - 7.7 K/uL   Lymphocytes Relative 17 %   Lymphs Abs 1.7 0.7 - 4.0 K/uL   Monocytes Relative 7 %   Monocytes Absolute 0.7 0.1 - 1.0 K/uL   Eosinophils Relative 0 %   Eosinophils Absolute 0.0 0.0 - 0.5 K/uL   Basophils Relative 0 %   Basophils Absolute 0.0 0.0 - 0.1 K/uL   Immature Granulocytes 1 %   Abs Immature Granulocytes 0.05 0.00 - 0.07 K/uL  Comprehensive metabolic panel     Status: Abnormal   Collection Time:  01/01/21 11:31 AM  Result Value Ref Range   Sodium 136 135 - 145 mmol/L   Potassium 4.2 3.5 - 5.1 mmol/L   Chloride 103 98 - 111 mmol/L   CO2 24 22 - 32 mmol/L   Glucose, Bld 117 (H) 70 - 99 mg/dL   BUN 9 6 - 20 mg/dL   Creatinine, Ser 9.50 0.61 - 1.24 mg/dL   Calcium 9.4 8.9 - 93.2 mg/dL   Total Protein 8.2 (H) 6.5 - 8.1 g/dL   Albumin 4.2 3.5 - 5.0 g/dL   AST 27 15 - 41 U/L   ALT 34 0 - 44 U/L   Alkaline Phosphatase 80 38 - 126 U/L   Total Bilirubin 0.7 0.3 - 1.2 mg/dL   GFR, Estimated >67 >12 mL/min   Anion gap 9 5 - 15  Troponin I (High Sensitivity)     Status: None   Collection Time: 01/01/21 11:31 AM  Result Value Ref Range   Troponin I (High Sensitivity) 11 <18 ng/L  Ethanol     Status: None   Collection Time: 01/01/21 11:31 AM  Result Value Ref Range   Alcohol, Ethyl (B) <10 <10 mg/dL  Troponin I (High Sensitivity)     Status: None   Collection Time: 01/01/21 12:54 PM  Result Value Ref Range   Troponin I (High Sensitivity) 10 <18 ng/L  Resp Panel by RT-PCR (Flu A&B, Covid) Nasopharyngeal Swab      Status: None   Collection Time: 01/01/21  5:06 PM   Specimen: Nasopharyngeal Swab; Nasopharyngeal(NP) swabs in vial transport medium  Result Value Ref Range   SARS Coronavirus 2 by RT PCR NEGATIVE NEGATIVE   Influenza A by PCR NEGATIVE NEGATIVE   Influenza B by PCR NEGATIVE NEGATIVE  HIV Antibody (routine testing w rflx)     Status: None   Collection Time: 01/01/21  5:47 PM  Result Value Ref Range   HIV Screen 4th Generation wRfx Non Reactive Non Reactive  Vitamin B12     Status: None   Collection Time: 01/01/21  5:47 PM  Result Value Ref Range   Vitamin B-12 335 180 - 914 pg/mL  Folate     Status: None   Collection Time: 01/01/21  5:47 PM  Result Value Ref Range   Folate 11.6 >5.9 ng/mL  TSH     Status: None   Collection Time: 01/01/21  5:47 PM  Result Value Ref Range   TSH 0.825 0.350 - 4.500 uIU/mL  Lipid panel     Status: Abnormal   Collection Time: 01/01/21  6:25 PM  Result Value Ref Range   Cholesterol 197 0 - 200 mg/dL   Triglycerides 458 (H) <150 mg/dL   HDL 45 >09 mg/dL   Total CHOL/HDL Ratio 4.4 RATIO   VLDL 39 0 - 40 mg/dL   LDL Cholesterol 983 (H) 0 - 99 mg/dL  Basic metabolic panel     Status: Abnormal   Collection Time: 01/02/21  5:36 AM  Result Value Ref Range   Sodium 136 135 - 145 mmol/L   Potassium 3.9 3.5 - 5.1 mmol/L   Chloride 103 98 - 111 mmol/L   CO2 25 22 - 32 mmol/L   Glucose, Bld 116 (H) 70 - 99 mg/dL   BUN 13 6 - 20 mg/dL   Creatinine, Ser 3.82 0.61 - 1.24 mg/dL   Calcium 9.3 8.9 - 50.5 mg/dL   GFR, Estimated >39 >76 mL/min   Anion gap 8 5 - 15  CBC  with Differential/Platelet     Status: None   Collection Time: 01/02/21  5:36 AM  Result Value Ref Range   WBC 9.1 4.0 - 10.5 K/uL   RBC 4.86 4.22 - 5.81 MIL/uL   Hemoglobin 14.7 13.0 - 17.0 g/dL   HCT 16.1 09.6 - 04.5 %   MCV 89.1 80.0 - 100.0 fL   MCH 30.2 26.0 - 34.0 pg   MCHC 33.9 30.0 - 36.0 g/dL   RDW 40.9 81.1 - 91.4 %   Platelets 260 150 - 400 K/uL   nRBC 0.0 0.0 - 0.2 %    Neutrophils Relative % 58 %   Neutro Abs 5.2 1.7 - 7.7 K/uL   Lymphocytes Relative 34 %   Lymphs Abs 3.1 0.7 - 4.0 K/uL   Monocytes Relative 6 %   Monocytes Absolute 0.6 0.1 - 1.0 K/uL   Eosinophils Relative 1 %   Eosinophils Absolute 0.1 0.0 - 0.5 K/uL   Basophils Relative 1 %   Basophils Absolute 0.1 0.0 - 0.1 K/uL   Immature Granulocytes 0 %   Abs Immature Granulocytes 0.03 0.00 - 0.07 K/uL  Magnesium     Status: None   Collection Time: 01/02/21  5:36 AM  Result Value Ref Range   Magnesium 2.3 1.7 - 2.4 mg/dL    Recent Results (from the past 240 hour(s))  Resp Panel by RT-PCR (Flu A&B, Covid) Nasopharyngeal Swab     Status: None   Collection Time: 01/01/21  5:06 PM   Specimen: Nasopharyngeal Swab; Nasopharyngeal(NP) swabs in vial transport medium  Result Value Ref Range Status   SARS Coronavirus 2 by RT PCR NEGATIVE NEGATIVE Final    Comment: (NOTE) SARS-CoV-2 target nucleic acids are NOT DETECTED.  The SARS-CoV-2 RNA is generally detectable in upper respiratory specimens during the acute phase of infection. The lowest concentration of SARS-CoV-2 viral copies this assay can detect is 138 copies/mL. A negative result does not preclude SARS-Cov-2 infection and should not be used as the sole basis for treatment or other patient management decisions. A negative result may occur with  improper specimen collection/handling, submission of specimen other than nasopharyngeal swab, presence of viral mutation(s) within the areas targeted by this assay, and inadequate number of viral copies(<138 copies/mL). A negative result must be combined with clinical observations, patient history, and epidemiological information. The expected result is Negative.  Fact Sheet for Patients:  BloggerCourse.com  Fact Sheet for Healthcare Providers:  SeriousBroker.it  This test is no t yet approved or cleared by the [place] FDA and  has been  authorized for detection and/or diagnosis of SARS-CoV-2 by FDA under an Emergency Use Authorization (EUA). This EUA will remain  in effect (meaning this test can be used) for the duration of the COVID-19 declaration under Section 564(b)(1) of the Act, 21 U.S.C.section 360bbb-3(b)(1), unless the authorization is terminated  or revoked sooner.       Influenza A by PCR NEGATIVE NEGATIVE Final   Influenza B by PCR NEGATIVE NEGATIVE Final    Comment: (NOTE) The Xpert Xpress SARS-CoV-2/FLU/RSV plus assay is intended as an aid in the diagnosis of influenza from Nasopharyngeal swab specimens and should not be used as a sole basis for treatment. Nasal washings and aspirates are unacceptable for Xpert Xpress SARS-CoV-2/FLU/RSV testing.  Fact Sheet for Patients: BloggerCourse.com  Fact Sheet for Healthcare Providers: SeriousBroker.it  This test is not yet approved or cleared by the [place] FDA and has been authorized for detection and/or diagnosis of SARS-CoV-2  by FDA under an Emergency Use Authorization (EUA). This EUA will remain in effect (meaning this test can be used) for the duration of the COVID-19 declaration under Section 564(b)(1) of the Act, 21 U.S.C. section 360bbb-3(b)(1), unless the authorization is terminated or revoked.  Performed at [facility], [address] [place], [place] [address]      Radiology Studies: CT Head Wo Contrast  Result Date: 01/01/2021 CLINICAL DATA:  Dizziness, nausea, vomiting since last night EXAM: CT HEAD WITHOUT CONTRAST TECHNIQUE: Contiguous axial images were obtained from the base of the skull through the vertex without intravenous contrast. COMPARISON:  None. FINDINGS: Brain: No evidence of acute infarction, hemorrhage, hydrocephalus, extra-axial collection or mass lesion/mass effect. Vascular: No hyperdense vessel or unexpected calcification. Skull: No osseous  abnormality. Sinuses/Orbits: Small right maxillary sinus mucous retention cyst. Visualized mastoid sinuses are clear. Visualized orbits demonstrate no focal abnormality. Other: None IMPRESSION: No acute intracranial pathology. Electronically Signed   By: Elige KoHetal  Patel   On: 01/01/2021 12:28   MR BRAIN WO CONTRAST  Result Date: 01/01/2021 CLINICAL DATA:  Nonspecific dizziness. EXAM: MRI HEAD WITHOUT CONTRAST TECHNIQUE: Multiplanar, multiecho pulse sequences of the brain and surrounding structures were obtained without intravenous contrast. COMPARISON:  Head CT same day FINDINGS: Brain: Diffusion imaging does not show any acute or subacute infarction. No focal abnormality affects the brainstem. No focal cerebellar finding. Cerebral hemispheres show scattered punctate foci of T2 and FLAIR signal within the white matter consistent with minimal small vessel change. No cortical or large vessel territory infarction. No intra-axial mass lesion, hemorrhage, hydrocephalus or extra-axial collection. Vascular: Normal Skull and upper cervical spine: Normal appearance of the bones. 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 Sinuses/Orbits: Clear/normal Other: No fluid in the middle ears or mastoids. IMPRESSION: No abnormality seen to explain dizziness. No acute finding. Mild chronic small-vessel ischemic change of the hemispheric white matter. Electronically Signed   By: Paulina FusiMark  Shogry M.D.   On: 01/01/2021 16:27   DG Chest Portable 1 View  Result Date: 01/01/2021 CLINICAL DATA:  Shortness of breath.  Nausea vomiting. EXAM: PORTABLE CHEST 1 VIEW COMPARISON:  None. FINDINGS: The heart size and mediastinal contours are within normal limits. Both lungs are clear. The visualized skeletal structures are unremarkable. IMPRESSION: No active disease. Electronically Signed   By: Gerome Samavid  Williams III M.D   On: 01/01/2021 14:14   MR BRAIN WO CONTRAST  Final Result    DG Chest Portable 1 View   Final Result    CT Head Wo Contrast  Final Result      Scheduled Meds: . enoxaparin (LOVENOX) injection  40 mg Subcutaneous Daily  . folic acid  1 mg Oral Daily  . lisinopril  20 mg Oral Once  . [START ON 01/03/2021] lisinopril  40 mg Oral Daily  . multivitamin with minerals  1 tablet Oral Daily  . nicotine  21 mg Transdermal Daily  . sodium chloride flush  3 mL Intravenous Q12H  . thiamine  100 mg Oral Daily   Or  . thiamine  100 mg Intravenous Daily  . [START ON 01/03/2021] vitamin B-12  1,000 mcg Oral Daily   PRN Meds: hydrALAZINE, labetalol, LORazepam **OR** LORazepam Continuous Infusions:   LOS: 1 day  Time spent: Greater than 50% of the 35 minute visit was spent in counseling/coordination of care for the patient as laid out in the A&P.   Lewie Chamberavid Ivey Nembhard, MD [facility] 01/02/2021, 10:12 AM

## 2021-01-03 LAB — HEMOGLOBIN A1C
Hgb A1c MFr Bld: 5.8 % — ABNORMAL HIGH (ref 4.8–5.6)
Mean Plasma Glucose: 119.76 mg/dL

## 2021-01-03 LAB — CBC WITH DIFFERENTIAL/PLATELET
Abs Immature Granulocytes: 0.03 10*3/uL (ref 0.00–0.07)
Basophils Absolute: 0.1 10*3/uL (ref 0.0–0.1)
Basophils Relative: 1 %
Eosinophils Absolute: 0.1 10*3/uL (ref 0.0–0.5)
Eosinophils Relative: 1 %
HCT: 46.6 % (ref 39.0–52.0)
Hemoglobin: 15.6 g/dL (ref 13.0–17.0)
Immature Granulocytes: 0 %
Lymphocytes Relative: 33 %
Lymphs Abs: 3 10*3/uL (ref 0.7–4.0)
MCH: 30.6 pg (ref 26.0–34.0)
MCHC: 33.5 g/dL (ref 30.0–36.0)
MCV: 91.4 fL (ref 80.0–100.0)
Monocytes Absolute: 0.7 10*3/uL (ref 0.1–1.0)
Monocytes Relative: 8 %
Neutro Abs: 5.3 10*3/uL (ref 1.7–7.7)
Neutrophils Relative %: 57 %
Platelets: 251 10*3/uL (ref 150–400)
RBC: 5.1 MIL/uL (ref 4.22–5.81)
RDW: 13.8 % (ref 11.5–15.5)
WBC: 9.3 10*3/uL (ref 4.0–10.5)
nRBC: 0 % (ref 0.0–0.2)

## 2021-01-03 LAB — BASIC METABOLIC PANEL
Anion gap: 10 (ref 5–15)
BUN: 13 mg/dL (ref 6–20)
CO2: 28 mmol/L (ref 22–32)
Calcium: 9.4 mg/dL (ref 8.9–10.3)
Chloride: 104 mmol/L (ref 98–111)
Creatinine, Ser: 0.86 mg/dL (ref 0.61–1.24)
GFR, Estimated: >60 mL/min (ref >60)
Glucose, Bld: 103 mg/dL — ABNORMAL HIGH (ref 70–99)
Potassium: 4 mmol/L (ref 3.5–5.1)
Sodium: 142 mmol/L (ref 135–145)

## 2021-01-03 LAB — MAGNESIUM: Magnesium: 2.2 mg/dL (ref 1.7–2.4)

## 2021-01-03 MED ORDER — AMLODIPINE BESYLATE 10 MG PO TABS
10.0000 mg | ORAL_TABLET | Freq: Every day | ORAL | Status: DC
  Administered 2021-01-03 – 2021-01-05 (×3): 10 mg via ORAL
  Filled 2021-01-03 (×3): qty 1

## 2021-01-03 MED ORDER — HYDROCHLOROTHIAZIDE 25 MG PO TABS
50.0000 mg | ORAL_TABLET | Freq: Every day | ORAL | Status: DC
  Administered 2021-01-03 – 2021-01-05 (×3): 50 mg via ORAL
  Filled 2021-01-03 (×3): qty 2

## 2021-01-03 NOTE — Progress Notes (Signed)
Progress Note    Alex [name]   ZPH:[id number]  DOB: [birth year]  DOA: 01/01/2021     2  PCP: Pcp, No  CC: dizzy  Hospital Course: Alex [name] is a 50 year old male with PMH uncontrolled hypertension, COPD, ongoing tobacco use, ongoing alcohol use, and occasional illicit drug use.  He does not routinely follow with a provider outpatient and takes no medications at home. He presented to the ER with complaints of the room spinning that has not improved over the past 2 days.  He states that last night his symptoms became constant and would not go away and he has been staggering since. He states that he went to [employer] 2 days ago and felt dizzy initially at that time then went home and drank a sixpack because he felt wobbly.  Symptoms did not improve. He also has been complaining of double vision and pressure behind his right eye.  This morning he had nausea and multiple episodes of vomiting/dry heaving. He denies any associated chest pain, shortness of breath, headaches.  He endorses tingling sensations in his hands and fingers intermittently which have been going on a few months.  Denies any sensory changes in his legs nor any focal weakness anywhere.  He endorses smoking approximately 1.5 PPD the past 2 years but prior to that has smoked approximately 1 PPD for 40 years. Alcohol intake is on average of 6-pack Budweiser nightly but up to 12-pack at times. He occasionally endorses marijuana and meth use; last use was meth ~ 1 month ago.   On arrival in the ER his blood pressure was 231/145 and remained consistently elevated in this range. Due to his complaints of difficulty with balance he underwent stroke work-up as well. CT head was unremarkable and showed no acute abnormalities nor evidence of bleeding. MRI brain also unremarkable.  No cerebellar findings nor acute abnormalities to suggest stroke elsewhere.  No mention of cerebral atrophy either.  Does mention mild chronic small vessel  ischemic changes. CXR was obtained and also unremarkable.  Notable labs include: Trop 11>>10 Ethanol <10 Na 136, K 4.2, Cl 103, CO2 24, Glucose 117, Creat 0.8, BUN 9 Liver enzymes normal. CBC also normal and unremarkable.  He was treated with clonidine, Valium, Ativan, meclizine, Zofran while in the ER.  Follow-up blood pressure after treatment was 178/111.  He is admitted for further blood pressure control and secondary work-up.   Interval History:  No events overnight.  Still dizzy/room spinning. BP coming down but still well above goal. He otherwise really doesn't describe much else in terms of symptoms of any sort.  Mood Pleasant and cooperative.  No withdrawal signs either.  ROS: Constitutional: negative for chills and fevers, Respiratory: negative for cough, Cardiovascular: negative for chest pain and Gastrointestinal: negative for abdominal pain  Assessment & Plan: * Hypertensive urgency - BP on presentation 231/145 which was repeated and remained within that range.  - BP has lowered at good rate - will target further deescalation to normal parameters - BP remains refractory some but trend is coming down - see HTN - check TSH (normal)  HTN (hypertension) - Patient has known history but states he has not been on medications for quite some time - EKG shows borderline LVH.  Echo shows mild LVH. EF 60-65%, Gr II DD - continuing to adjust his BP regimen; still not controlled - continue lisinopril 40 mg, amlodipine 10 mg, and increase hctz to 50 mg today; next options would be hydralazine vs coreg (he  prefers not a BB if going to make him feel rundown)  Vertigo - stroke ruled out; [facility] and MRI brain reassuring; symptoms considered 2/2 uncontrolled hypertension at this time.  Also does not appear to be intoxicated nor withdrawing - BPPV ruled out as well now; seen by vestibular PT on 5/2 - still describing vertigo; not improving despite BP coming down - B12 deficiency related? -  will ask for neurology input due to persistent symptom  Substance use - Patient endorses occasional marijuana and meth use.  Last use was meth approximately 1 month ago - Patient has been counseled about cessation bedside  Alcohol use - Last drink 8 PM 12/31/2020, Budweiser. -Average 6-pack beer nightly but up to 12 pack at times -Monitor on CIWA - Check folate (11), B12 (335) levels  - Continue thiamine, folate, multivitamin -B12 shot x1 today then oral daily replacement  Tobacco use -  Average 1.5 PPD (aprox 40 pack year history) - nicotine patch ordered    Old records reviewed in assessment of this patient  Antimicrobials: n/a  DVT prophylaxis: enoxaparin (LOVENOX) injection 40 mg Start: 01/02/21 1000   Code Status:   Code Status: Full Code Family Communication:   Disposition Plan: Status is: Inpatient  Remains inpatient appropriate because:IV treatments appropriate due to intensity of illness or inability to take PO and Inpatient level of care appropriate due to severity of illness   Dispo: The patient is from: Home              Anticipated d/c is to: Home              Patient currently is not medically stable to d/c.   Difficult to place patient No  Risk of unplanned readmission score: Unplanned Admission- Pilot do not use: 11.26   Objective: Blood pressure (!) 151/93, pulse 73, temperature 97.7 F (36.5 C), temperature source Oral, resp. rate 18, height  (1.803 m), weight 96.6 kg, SpO2 93 %.  Examination: General appearance: alert, cooperative and no distress Head: Normocephalic, without obvious abnormality, atraumatic Eyes: EOMI, PERRL Lungs: clear to auscultation bilaterally Heart: regular rate and rhythm and S1, S2 normal Abdomen: normal findings: bowel sounds normal and soft, non-tender Extremities: no edema Skin: mobility and turgor normal Neurologic: No focal weakness.  Some paresthesia appreciated in right hand (radial distribution) and patient  also complained of paresthesia in left leg.  No dysmetria or dysdiadochokinesia.  Gait deferred.  Consultants:   PT vestibular eval  Procedures:     Data Reviewed: I have personally reviewed following labs and imaging studies Results for orders placed or performed during the hospital encounter of 01/01/21 (from the past 24 hour(s))  Hemoglobin A1c     Status: Abnormal   Collection Time: 01/03/21  5:06 AM  Result Value Ref Range   Hgb A1c MFr Bld 5.8 (H) 4.8 - 5.6 %   Mean Plasma Glucose 119.76 mg/dL  Basic metabolic panel     Status: Abnormal   Collection Time: 01/03/21  5:06 AM  Result Value Ref Range   Sodium 142 135 - 145 mmol/L   Potassium 4.0 3.5 - 5.1 mmol/L   Chloride 104 98 - 111 mmol/L   CO2 28 22 - 32 mmol/L   Glucose, Bld 103 (H) 70 - 99 mg/dL   BUN 13 6 - 20 mg/dL   Creatinine, Ser 7.82 0.61 - 1.24 mg/dL   Calcium 9.4 8.9 - 95.6 mg/dL   GFR, Estimated >21 >30 mL/min   Anion  gap 10 5 - 15  CBC with Differential/Platelet     Status: None   Collection Time: 01/03/21  5:06 AM  Result Value Ref Range   WBC 9.3 4.0 - 10.5 K/uL   RBC 5.10 4.22 - 5.81 MIL/uL   Hemoglobin 15.6 13.0 - 17.0 g/dL   HCT 16.146.6 09.639.0 - 04.552.0 %   MCV 91.4 80.0 - 100.0 fL   MCH 30.6 26.0 - 34.0 pg   MCHC 33.5 30.0 - 36.0 g/dL   RDW 40.913.8 81.111.5 - 91.415.5 %   Platelets 251 150 - 400 K/uL   nRBC 0.0 0.0 - 0.2 %   Neutrophils Relative % 57 %   Neutro Abs 5.3 1.7 - 7.7 K/uL   Lymphocytes Relative 33 %   Lymphs Abs 3.0 0.7 - 4.0 K/uL   Monocytes Relative 8 %   Monocytes Absolute 0.7 0.1 - 1.0 K/uL   Eosinophils Relative 1 %   Eosinophils Absolute 0.1 0.0 - 0.5 K/uL   Basophils Relative 1 %   Basophils Absolute 0.1 0.0 - 0.1 K/uL   Immature Granulocytes 0 %   Abs Immature Granulocytes 0.03 0.00 - 0.07 K/uL   Reactive, Benign Lymphocytes PRESENT   Magnesium     Status: None   Collection Time: 01/03/21  5:06 AM  Result Value Ref Range   Magnesium 2.2 1.7 - 2.4 mg/dL    Recent Results (from the  past 240 hour(s))  Resp Panel by RT-PCR (Flu A&B, Covid) Nasopharyngeal Swab     Status: None   Collection Time: 01/01/21  5:06 PM   Specimen: Nasopharyngeal Swab; Nasopharyngeal(NP) swabs in vial transport medium  Result Value Ref Range Status   SARS Coronavirus 2 by RT PCR NEGATIVE NEGATIVE Final    Comment: (NOTE) SARS-CoV-2 target nucleic acids are NOT DETECTED.  The SARS-CoV-2 RNA is generally detectable in upper respiratory specimens during the acute phase of infection. The lowest concentration of SARS-CoV-2 viral copies this assay can detect is 138 copies/mL. A negative result does not preclude SARS-Cov-2 infection and should not be used as the sole basis for treatment or other patient management decisions. A negative result may occur with  improper specimen collection/handling, submission of specimen other than nasopharyngeal swab, presence of viral mutation(s) within the areas targeted by this assay, and inadequate number of viral copies(<138 copies/mL). A negative result must be combined with clinical observations, patient history, and epidemiological information. The expected result is Negative.  Fact Sheet for Patients:  BloggerCourse.comhttps://www.fda.gov/media/152166/download  Fact Sheet for Healthcare Providers:  SeriousBroker.ithttps://www.fda.gov/media/152162/download  This test is no t yet approved or cleared by the [place] FDA and  has been authorized for detection and/or diagnosis of SARS-CoV-2 by FDA under an Emergency Use Authorization (EUA). This EUA will remain  in effect (meaning this test can be used) for the duration of the COVID-19 declaration under Section 564(b)(1) of the Act, 21 U.S.C.section 360bbb-3(b)(1), unless the authorization is terminated  or revoked sooner.       Influenza A by PCR NEGATIVE NEGATIVE Final   Influenza B by PCR NEGATIVE NEGATIVE Final    Comment: (NOTE) The Xpert Xpress SARS-CoV-2/FLU/RSV plus assay is intended as an aid in the diagnosis of  influenza from Nasopharyngeal swab specimens and should not be used as a sole basis for treatment. Nasal washings and aspirates are unacceptable for Xpert Xpress SARS-CoV-2/FLU/RSV testing.  Fact Sheet for Patients: BloggerCourse.comhttps://www.fda.gov/media/152166/download  Fact Sheet for Healthcare Providers: SeriousBroker.ithttps://www.fda.gov/media/152162/download  This test is not yet approved or cleared by  the [employer] and has been authorized for detection and/or diagnosis of SARS-CoV-2 by FDA under an Emergency Use Authorization (EUA). This EUA will remain in effect (meaning this test can be used) for the duration of the COVID-19 declaration under Section 564(b)(1) of the Act, 21 U.S.C. section 360bbb-3(b)(1), unless the authorization is terminated or revoked.  Performed at [facility], [address] [place], [place] [address]      Radiology Studies: MR BRAIN WO CONTRAST  Result Date: 01/01/2021 CLINICAL DATA:  Nonspecific dizziness. EXAM: MRI HEAD WITHOUT CONTRAST TECHNIQUE: Multiplanar, multiecho pulse sequences of the brain and surrounding structures were obtained without intravenous contrast. COMPARISON:  Head CT same day FINDINGS: Brain: Diffusion imaging does not show any acute or subacute infarction. No focal abnormality affects the brainstem. No focal cerebellar finding. Cerebral hemispheres show scattered punctate foci of T2 and FLAIR signal within the white matter consistent with minimal small vessel change. No cortical or large vessel territory infarction. No intra-axial mass lesion, hemorrhage, hydrocephalus or extra-axial collection. Vascular: Normal Skull and upper cervical spine: Normal appearance of the bones. 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 Sinuses/Orbits: Clear/normal Other: No fluid in the middle ears or mastoids. IMPRESSION: No abnormality seen to explain dizziness. No acute finding. Mild chronic small-vessel  ischemic change of the hemispheric white matter. Electronically Signed   By: Paulina Fusi M.D.   On: 01/01/2021 16:27   ECHOCARDIOGRAM COMPLETE  Result Date: 01/02/2021    ECHOCARDIOGRAM REPORT   Patient Name:   Alex [name] Date of Exam: 01/02/2021 Medical Rec #:  [id number]      Height:       71.0 in Accession #:    [id number]     Weight:       213.0 lb Date of Birth:  [birth year]      BSA:          2.166 m Patient Age:    50 years       BP:           138/85 mmHg Patient Gender: M              HR:           76 bpm. Exam Location:  Inpatient Procedure: 2D Echo, Cardiac Doppler and Color Doppler Indications:    I10 Hypertension  History:        Patient has prior history of Echocardiogram examinations, most                 recent 10/14/2019. CHF and Cardiomyopathy, Previous Myocardial                 Infarction, COPD, Signs/Symptoms:Dyspnea; Risk Factors:Former                 Smoker, Dyslipidemia and Hypertension. CKD. GERD. Abdominal                 Aortic Aneurysm.  Sonographer:    Elmarie Shiley Dance Referring Phys: [id number] IMPRESSIONS  1. Left ventricular ejection fraction, by estimation, is 60 to 65%. The left ventricle has normal function. The left ventricle has no regional wall motion abnormalities. There is mild concentric left ventricular hypertrophy. Left ventricular diastolic parameters are consistent with Grade II diastolic dysfunction (pseudonormalization).  2. Right ventricular systolic function is normal. The right ventricular size is normal.  3. Left atrial size was mildly dilated.  4. A small pericardial effusion is present. The  pericardial effusion is circumferential.  5. The mitral valve is normal in structure. No evidence of mitral valve regurgitation. No evidence of mitral stenosis.  6. The aortic valve is normal in structure. Aortic valve regurgitation is not visualized. No aortic stenosis is present.  7. The inferior vena cava is normal in size with greater than 50% respiratory  variability, suggesting right atrial pressure of 3 mmHg. FINDINGS  Left Ventricle: Left ventricular ejection fraction, by estimation, is 60 to 65%. The left ventricle has normal function. The left ventricle has no regional wall motion abnormalities. The left ventricular internal cavity size was normal in size. There is  mild concentric left ventricular hypertrophy. Left ventricular diastolic parameters are consistent with Grade II diastolic dysfunction (pseudonormalization). Normal left ventricular filling pressure. Right Ventricle: The right ventricular size is normal. No increase in right ventricular wall thickness. Right ventricular systolic function is normal. Left Atrium: Left atrial size was mildly dilated. Right Atrium: Right atrial size was normal in size. Pericardium: A small pericardial effusion is present. The pericardial effusion is circumferential. Mitral Valve: The mitral valve is normal in structure. Mild mitral annular calcification. No evidence of mitral valve regurgitation. No evidence of mitral valve stenosis. Tricuspid Valve: The tricuspid valve is normal in structure. Tricuspid valve regurgitation is not demonstrated. No evidence of tricuspid stenosis. Aortic Valve: The aortic valve is normal in structure. Aortic valve regurgitation is not visualized. No aortic stenosis is present. Pulmonic Valve: The pulmonic valve was normal in structure. Pulmonic valve regurgitation is not visualized. No evidence of pulmonic stenosis. Aorta: The aortic root is normal in size and structure. Venous: The inferior vena cava is normal in size with greater than 50% respiratory variability, suggesting right atrial pressure of 3 mmHg. IAS/Shunts: No atrial level shunt detected by color flow Doppler.  LEFT VENTRICLE PLAX 2D LVIDd:         4.60 cm  Diastology LVIDs:         3.60 cm  LV e' medial:    6.20 cm/s LV PW:         1.20 cm  LV E/e' medial:  13.6 LV IVS:        1.20 cm  LV e' lateral:   8.16 cm/s LVOT diam:      2.10 cm  LV E/e' lateral: 10.3 LV SV:         91 LV SV Index:   42 LVOT Area:     3.46 cm  RIGHT VENTRICLE             IVC RV Basal diam:  2.90 cm     IVC diam: 1.90 cm RV S prime:     13.30 cm/s TAPSE (M-mode): 2.3 cm LEFT ATRIUM              Index       RIGHT ATRIUM           Index LA diam:        3.80 cm  1.75 cm/m  RA Area:     19.00 cm LA Vol (A2C):   106.0 ml 48.94 ml/m RA Volume:   52.40 ml  24.19 ml/m LA Vol (A4C):   54.0 ml  24.93 ml/m LA Biplane Vol: 79.8 ml  36.85 ml/m  AORTIC VALVE LVOT Vmax:   134.00 cm/s LVOT Vmean:  86.900 cm/s LVOT VTI:    0.262 m  AORTA Ao Root diam: 3.50 cm Ao Asc diam:  3.60 cm MITRAL VALVE MV Area (PHT): 2.73  cm    SHUNTS MV Decel Time: 278 msec    Systemic VTI:  0.26 m MV E velocity: 84.40 cm/s  Systemic Diam: 2.10 cm MV A velocity: 84.40 cm/s MV E/A ratio:  1.00 Armanda Magic MD Electronically signed by Armanda Magic MD Signature Date/Time: 01/02/2021/11:25:47 AM    Final    MR BRAIN WO CONTRAST  Final Result    DG Chest Portable 1 View  Final Result    CT Head Wo Contrast  Final Result      Scheduled Meds: . amLODipine  10 mg Oral Daily  . enoxaparin (LOVENOX) injection  40 mg Subcutaneous Daily  . folic acid  1 mg Oral Daily  . hydrochlorothiazide  50 mg Oral Daily  . lisinopril  40 mg Oral Daily  . multivitamin with minerals  1 tablet Oral Daily  . nicotine  21 mg Transdermal Daily  . sodium chloride flush  3 mL Intravenous Q12H  . thiamine  100 mg Oral Daily   Or  . thiamine  100 mg Intravenous Daily  . vitamin B-12  1,000 mcg Oral Daily   PRN Meds: hydrALAZINE, labetalol, LORazepam **OR** LORazepam Continuous Infusions:   LOS: 2 days  Time spent: Greater than 50% of the 35 minute visit was spent in counseling/coordination of care for the patient as laid out in the A&P.   Lewie Chamber, MD [facility] 01/03/2021, 3:16 PM

## 2021-01-03 NOTE — Evaluation (Signed)
Physical Therapy Evaluation Patient Details Name: Alex [name] MRN: [id number] DOB: 04-Dec-1970 Today's Date: 01/03/2021   History of Present Illness  50 year old male admitted for hypertensive urgency.  PMH uncontrolled hypertension, COPD, ongoing tobacco use, ongoing alcohol use, and occasional illicit drug use.  Clinical Impression  Pt admitted with above diagnosis. Pt currently with functional limitations due to the deficits listed below (see PT Problem List). Pt will benefit from skilled PT to increase their independence and safety with mobility to allow discharge to the venue listed below.   Pt reports mostly constant "dizziness" however typically describes as blurred vision or diplopia with testing and mobility.  Pt ambulated 40 feet with RW and requiring min assist at this time.  BP: 189/122 (138) upon return to room after ambulating.  Anticipate symptoms to improve with medical treatment.  Will continue to assist with safely mobilizing in acute care.    01/03/21 1236  Vestibular Assessment  General Observation Pt reports dizziness started on Thursday after drinking at [facility].  Pt went home and drank more alcohol.  Pt reports dizziness became constant on Saturday.  Symptom Behavior  Subjective history of current problem pt reports room moving, double vision, blurred vision, always constant however improved with staying still  Type of Dizziness  Blurred vision;Diplopia;"Funny feeling in head";"World moves"  Frequency of Dizziness started with only movement, now mostly constant  Symptom Nature Motion provoked;Constant  Aggravating Factors Activity in general;Moving eyes  Relieving Factors Closing eyes;Rest  Progression of Symptoms Better  Oculomotor Exam  Oculomotor Alignment Normal  Spontaneous Absent  Gaze-induced  Right beating nystagmus with R gaze  Head shaking Horizontal  (unable to focus, no nystagmus observed)  Smooth Pursuits Intact  Saccades Intact (although reports  nausea with right)  Vestibulo-Ocular Reflex  VOR Cancellation Unable to maintain gaze  Positional Testing  Dix-Hallpike Dix-Hallpike Right;Dix-Hallpike Left  Horizontal Canal Testing Horizontal Canal Right;Horizontal Canal Left  Dix-Hallpike Right  Dix-Hallpike Right Symptoms No nystagmus  Dix-Hallpike Left  Dix-Hallpike Left Symptoms No nystagmus  Horizontal Canal Right  Horizontal Canal Right Symptoms Normal  Horizontal Canal Left  Horizontal Canal Left Symptoms  (brief questionable nystagmus, reports blurred vision, feels worse then Rt side)  Cognition  Cognition Orientation Level Oriented x 4         Follow Up Recommendations No PT follow up    Equipment Recommendations  Rolling walker with 5" wheels    Recommendations for Other Services       Precautions / Restrictions Precautions Precautions: Fall      Mobility  Bed Mobility Overal bed mobility: Modified Independent                  Transfers Overall transfer level: Needs assistance Equipment used: Rolling walker (2 wheeled) Transfers: Sit to/from Stand Sit to Stand: Min assist         General transfer comment: slight assist to rise and steady  Ambulation/Gait Ambulation/Gait assistance: Min assist Gait Distance (Feet): 40 Feet Assistive device: Rolling walker (2 wheeled) Gait Pattern/deviations: Step-through pattern;Decreased stride length Gait velocity: decreased   General Gait Details: short step length, feels he drifts to left, assist to stabilize, cues for use of RW, mostly reports blurred vision  Stairs            Wheelchair Mobility    Modified Rankin (Stroke Patients Only)       Balance Overall balance assessment: Needs assistance         Standing balance support: Bilateral upper extremity supported  Standing balance-Leahy Scale: Poor Standing balance comment: reliant on UE support due to dizziness                             Pertinent Vitals/Pain  Pain Assessment: No/denies pain    Home Living Family/patient expects to be discharged to:: Private residence Living Arrangements: Non-relatives/Friends ("roommate")   Type of Home: Apartment Home Access: Level entry     Home Layout: One level Home Equipment: None      Prior Function Level of Independence: Independent               Hand Dominance        Extremity/Trunk Assessment   Upper Extremity Assessment Upper Extremity Assessment: Overall WFL for tasks assessed    Lower Extremity Assessment Lower Extremity Assessment: Overall WFL for tasks assessed    Cervical / Trunk Assessment Cervical / Trunk Assessment: Normal  Communication   Communication: No difficulties  Cognition Arousal/Alertness: Awake/alert Behavior During Therapy: WFL for tasks assessed/performed Overall Cognitive Status: Within Functional Limits for tasks assessed                                 General Comments: very pleasant and cooperative      General Comments      Exercises     Assessment/Plan    PT Assessment Patient needs continued PT services  PT Problem List Decreased mobility;Decreased activity tolerance;Decreased balance;Decreased knowledge of use of DME       PT Treatment Interventions Gait training;DME instruction;Therapeutic exercise;Balance training;Functional mobility training;Therapeutic activities;Patient/family education;Neuromuscular re-education    PT Goals (Current goals can be found in the Care Plan section)  Acute Rehab PT Goals PT Goal Formulation: With patient Time For Goal Achievement: 01/17/21 Potential to Achieve Goals: Good    Frequency Min 3X/week   Barriers to discharge        Co-evaluation               AM-PAC PT "6 Clicks" Mobility  Outcome Measure Help needed turning from your back to your side while in a flat bed without using bedrails?: A Little Help needed moving from lying on your back to sitting on the side  of a flat bed without using bedrails?: A Little Help needed moving to and from a bed to a chair (including a wheelchair)?: A Little Help needed standing up from a chair using your arms (e.g., wheelchair or bedside chair)?: A Little Help needed to walk in hospital room?: A Little Help needed climbing 3-5 steps with a railing? : A Lot 6 Click Score: 17    End of Session Equipment Utilized During Treatment: Gait belt Activity Tolerance: Patient tolerated treatment well Patient left: in chair;with call bell/phone within reach (agreeable to use call bell for assist, no falls) Nurse Communication: Mobility status PT Visit Diagnosis: Other abnormalities of gait and mobility (R26.89)    Time: 4163-8453 PT Time Calculation (min) (ACUTE ONLY): 29 min   Charges:   PT Evaluation $PT Eval Moderate Complexity: 1 Mod PT Treatments $Gait Training: 8-22 mins       Thomasene Mohair PT, DPT Acute Rehabilitation Services Pager: [phone] Office: [phone]  Maida Sale E 01/03/2021, 12:43 PM

## 2021-01-04 DIAGNOSIS — I10 Essential (primary) hypertension: Secondary | ICD-10-CM

## 2021-01-04 LAB — CBC WITH DIFFERENTIAL/PLATELET
Abs Immature Granulocytes: 0.05 10*3/uL (ref 0.00–0.07)
Basophils Absolute: 0.1 10*3/uL (ref 0.0–0.1)
Basophils Relative: 1 %
Eosinophils Absolute: 0.1 10*3/uL (ref 0.0–0.5)
Eosinophils Relative: 1 %
HCT: 47 % (ref 39.0–52.0)
Hemoglobin: 15.6 g/dL (ref 13.0–17.0)
Immature Granulocytes: 1 %
Lymphocytes Relative: 30 %
Lymphs Abs: 3.1 10*3/uL (ref 0.7–4.0)
MCH: 30.2 pg (ref 26.0–34.0)
MCHC: 33.2 g/dL (ref 30.0–36.0)
MCV: 90.9 fL (ref 80.0–100.0)
Monocytes Absolute: 1 10*3/uL (ref 0.1–1.0)
Monocytes Relative: 10 %
Neutro Abs: 5.9 10*3/uL (ref 1.7–7.7)
Neutrophils Relative %: 57 %
Platelets: 252 10*3/uL (ref 150–400)
RBC: 5.17 MIL/uL (ref 4.22–5.81)
RDW: 13.6 % (ref 11.5–15.5)
WBC: 10.2 10*3/uL (ref 4.0–10.5)
nRBC: 0 % (ref 0.0–0.2)

## 2021-01-04 LAB — BASIC METABOLIC PANEL
Anion gap: 10 (ref 5–15)
BUN: 17 mg/dL (ref 6–20)
CO2: 28 mmol/L (ref 22–32)
Calcium: 9.7 mg/dL (ref 8.9–10.3)
Chloride: 99 mmol/L (ref 98–111)
Creatinine, Ser: 1.09 mg/dL (ref 0.61–1.24)
GFR, Estimated: >60 mL/min (ref >60)
Glucose, Bld: 105 mg/dL — ABNORMAL HIGH (ref 70–99)
Potassium: 4.2 mmol/L (ref 3.5–5.1)
Sodium: 137 mmol/L (ref 135–145)

## 2021-01-04 LAB — MAGNESIUM: Magnesium: 2 mg/dL (ref 1.7–2.4)

## 2021-01-04 MED ORDER — VITAMIN B-12 1000 MCG PO TABS
1000.0000 ug | ORAL_TABLET | Freq: Every day | ORAL | Status: DC
  Administered 2021-01-05: 1000 ug via ORAL
  Filled 2021-01-04: qty 1

## 2021-01-04 MED ORDER — CYANOCOBALAMIN 1000 MCG/ML IJ SOLN
1000.0000 ug | Freq: Once | INTRAMUSCULAR | Status: AC
  Administered 2021-01-04: 1000 ug via INTRAMUSCULAR
  Filled 2021-01-04: qty 1

## 2021-01-04 MED ORDER — HYDRALAZINE HCL 25 MG PO TABS
25.0000 mg | ORAL_TABLET | Freq: Three times a day (TID) | ORAL | Status: DC
  Administered 2021-01-04 – 2021-01-05 (×4): 25 mg via ORAL
  Filled 2021-01-04 (×4): qty 1

## 2021-01-04 NOTE — Progress Notes (Signed)
Physical Therapy Treatment Patient Details Name: Alex [name] MRN: [id number] DOB: 09-01-1971 Today's Date: 01/04/2021    History of Present Illness 50 year old male admitted for hypertensive urgency.  PMH uncontrolled hypertension, COPD, ongoing tobacco use, ongoing alcohol use, and occasional illicit drug use.    PT Comments    Pt ambulated in hallway without assistive device and reports about 2/10 dizziness/blurred vision today and much improved since yesterday.  Pt very unsteady with occasional LOB however no physical assist required.  Pt declines use of RW however would be agreeable to use cane upon d/c for improving stability.   BP after ambulating: 174/105 (123) BP again after a couple minutes: 164/96 (112) and then 156/102 (120) RN aware of BPs after ambulating. Pt encouraged to ambulate again later this evening with nursing (for safety) as pt anticipates d/c home tomorrow.    Follow Up Recommendations  No PT follow up     Equipment Recommendations  Cane    Recommendations for Other Services       Precautions / Restrictions Precautions Precautions: Fall    Mobility  Bed Mobility Overal bed mobility: Modified Independent                  Transfers Overall transfer level: Modified independent                  Ambulation/Gait Ambulation/Gait assistance: Supervision Gait Distance (Feet): 400 Feet Assistive device: None Gait Pattern/deviations: Step-through pattern;Decreased stride length     General Gait Details: uncoordinated gait at times, mildly unsteady however no physical assist required, turning head or body increases unsteadiness, pt declines use of RW however would be agreeable to cane for a little more support upon d/c   Stairs             Wheelchair Mobility    Modified Rankin (Stroke Patients Only)       Balance Overall balance assessment: Needs assistance         Standing balance support: No upper extremity  supported Standing balance-Leahy Scale: Fair Standing balance comment: static fair                            Cognition Arousal/Alertness: Awake/alert Behavior During Therapy: WFL for tasks assessed/performed Overall Cognitive Status: Within Functional Limits for tasks assessed                                 General Comments: very pleasant and cooperative      Exercises      General Comments        Pertinent Vitals/Pain Pain Assessment: No/denies pain    Home Living                      Prior Function            PT Goals (current goals can now be found in the care plan section) Progress towards PT goals: Progressing toward goals    Frequency    Min 3X/week      PT Plan Current plan remains appropriate    Co-evaluation              AM-PAC PT "6 Clicks" Mobility   Outcome Measure  Help needed turning from your back to your side while in a flat bed without using bedrails?: A Little Help needed moving from lying on your back to  sitting on the side of a flat bed without using bedrails?: A Little Help needed moving to and from a bed to a chair (including a wheelchair)?: A Little Help needed standing up from a chair using your arms (e.g., wheelchair or bedside chair)?: A Little Help needed to walk in hospital room?: A Little Help needed climbing 3-5 steps with a railing? : A Little 6 Click Score: 18    End of Session Equipment Utilized During Treatment: Gait belt Activity Tolerance: Patient tolerated treatment well Patient left: in bed;with call bell/phone within reach Nurse Communication: Mobility status PT Visit Diagnosis: Other abnormalities of gait and mobility (R26.89)     Time: 4628-6381 PT Time Calculation (min) (ACUTE ONLY): 14 min  Charges:  $Gait Training: 8-22 mins                    [name], DPT Acute Rehabilitation Services Pager: [phone] Office: [phone]  Maida Sale E 01/04/2021,  3:34 PM

## 2021-01-04 NOTE — Progress Notes (Signed)
Progress Note    Alex CorneaWilliam [name]   ZOX:[id number]  DOB: Apr 23, 1971  DOA: 01/01/2021     3  PCP: Pcp, No  CC: dizzy  Hospital Course: [name] is a 50 year old male with PMH uncontrolled hypertension, COPD, ongoing tobacco use, ongoing alcohol use, and occasional illicit drug use.  He does not routinely follow with a provider outpatient and takes no medications at home. He presented to the ER with complaints of the room spinning that has not improved over the past 2 days.  He states that last night his symptoms became constant and would not go away and he has been staggering since. He states that he went to [employer] 2 days ago and felt dizzy initially at that time then went home and drank a sixpack because he felt wobbly.  Symptoms did not improve. He also has been complaining of double vision and pressure behind his right eye.  This morning he had nausea and multiple episodes of vomiting/dry heaving. He denies any associated chest pain, shortness of breath, headaches.  He endorses tingling sensations in his hands and fingers intermittently which have been going on a few months.  Denies any sensory changes in his legs nor any focal weakness anywhere.  He endorses smoking approximately 1.5 PPD the past 2 years but prior to that has smoked approximately 1 PPD for 40 years. Alcohol intake is on average of 6-pack Budweiser nightly but up to 12-pack at times. He occasionally endorses marijuana and meth use; last use was meth ~ 1 month ago.   On arrival in the ER his blood pressure was 231/145 and remained consistently elevated in this range. Due to his complaints of difficulty with balance he underwent stroke work-up as well. CT head was unremarkable and showed no acute abnormalities nor evidence of bleeding. MRI brain also unremarkable.  No cerebellar findings nor acute abnormalities to suggest stroke elsewhere.  No mention of cerebral atrophy either.  Does mention mild chronic small vessel  ischemic changes. CXR was obtained and also unremarkable.  Notable labs include: Trop 11>>10 Ethanol <10 Na 136, K 4.2, Cl 103, CO2 24, Glucose 117, Creat 0.8, BUN 9 Liver enzymes normal. CBC also normal and unremarkable.  He was treated with clonidine, Valium, Ativan, meclizine, Zofran while in the ER.  Follow-up blood pressure after treatment was 178/111.  He is admitted for further blood pressure control and secondary work-up.   Interval History:  No events overnight.  He does say that his vertigo is much improved today, up to 80% improved. We discussed possible treatment with steroid and/or benzo but he was okay just monitoring further for now (see discussion further below). BP still not at goal so going to add hydralazine today which he is okay with also.   ROS: Constitutional: negative for chills and fevers, Respiratory: negative for cough, Cardiovascular: negative for chest pain and Gastrointestinal: negative for abdominal pain  Assessment & Plan: * Hypertensive urgency - BP on presentation 231/145 which was repeated and remained within that range.  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  Echo shows mild LVH. EF 60-65%, Gr II DD - continuing to adjust his BP regimen; still not controlled - continue lisinopril 40 mg,  amlodipine 10 mg, hctz to 50 mg today - still not at goal; starting hydralazine 25 mg TID on 5/3; monitor further overnight; goal is discharge home Wednesday - would encourage outpt sleep study thru the [place] also   Vertigo - stroke ruled out; [facility] and MRI brain reassuring; symptoms considered 2/2 uncontrolled hypertension at this time.  Also does not appear to be intoxicated nor withdrawing -  BPPV ruled out as well now; seen by vestibular PT on 5/2 - still describing vertigo on 5/2 and therefore case discussed with neurology; workup was considered thorough, they recommended outpatient neuro eval if symptoms still ongoing - B12 deficiency related? - on 5/3, he stated that his symptoms were 80% improved which was very reassuring; plan was to tentatively treat for possible vestibular neuritis with steroid course but since he's improving finally on his own, will hold off on steroid for now and monitor further (he declined a benzo b/c he didn't want to be "fuzzy headed" or slow)  Substance use - Patient endorses occasional marijuana and meth use.  Last use was meth approximately 1 month ago - Patient has been counseled about cessation bedside  Alcohol use - Last drink 8 PM 12/31/2020, Budweiser. -Average 6-pack beer nightly but up to 12 pack at times -Monitor on CIWA - Check folate (11), B12 (335) levels  - Continue thiamine, folate, multivitamin -B12 shot x1 today again then oral daily replacement  Tobacco use -  Average 1.5 PPD (aprox 40 pack year history) - nicotine patch ordered    Old records reviewed in assessment of this patient  Antimicrobials: n/a  DVT prophylaxis: enoxaparin (LOVENOX) injection 40 mg Start: 01/02/21 1000   Code Status:   Code Status: Full Code Family Communication:   Disposition Plan: Status is: Inpatient  Remains inpatient appropriate because:IV treatments appropriate due to intensity of illness or inability to take PO and Inpatient level of care appropriate due to severity of illness   Dispo: The patient is from: Home              Anticipated d/c is to: Home              Patient currently is not medically stable to d/c.   Difficult to place patient No  Risk of unplanned readmission score: Unplanned Admission- Pilot do not use: 11.44   Objective: Blood pressure (!) 132/97, pulse 90, temperature 98.4 F (36.9 C), temperature source  Oral, resp. rate 20, height 5\' 11"  (1.803 m), weight 96.6 kg, SpO2 96 %.  Examination: General appearance: alert, cooperative and no distress Head: Normocephalic, without obvious abnormality, atraumatic Eyes: EOMI, PERRL Lungs: clear to auscultation bilaterally Heart: regular rate and rhythm and S1, S2 normal Abdomen: normal findings: bowel sounds normal and soft, non-tender Extremities: no edema Skin: mobility and turgor normal Neurologic: No focal weakness.  No dysmetria. No resting or elicited nystagmus.   Consultants:   PT vestibular eval, 5/2  Procedures:     Data Reviewed: I have personally reviewed following labs and imaging studies Results for orders placed or performed during the hospital encounter of 01/01/21 (from the past 24 hour(s))  Basic metabolic panel     Status: Abnormal   Collection Time: 01/04/21  4:35 AM  Result Value Ref Range   Sodium 137 135 - 145 mmol/L   Potassium 4.2 3.5 - 5.1 mmol/L   Chloride 99 98 - 111 mmol/L   CO2 28 22 - 32 mmol/L   Glucose, Bld 105 (H) 70 - 99  mg/dL   BUN 17 6 - 20 mg/dL   Creatinine, Ser 7.49 0.61 - 1.24 mg/dL   Calcium 9.7 8.9 - 44.9 mg/dL   GFR, Estimated >67 >59 mL/min   Anion gap 10 5 - 15  CBC with Differential/Platelet     Status: None   Collection Time: 01/04/21  4:35 AM  Result Value Ref Range   WBC 10.2 4.0 - 10.5 K/uL   RBC 5.17 4.22 - 5.81 MIL/uL   Hemoglobin 15.6 13.0 - 17.0 g/dL   HCT 16.3 84.6 - 65.9 %   MCV 90.9 80.0 - 100.0 fL   MCH 30.2 26.0 - 34.0 pg   MCHC 33.2 30.0 - 36.0 g/dL   RDW 93.5 70.1 - 77.9 %   Platelets 252 150 - 400 K/uL   nRBC 0.0 0.0 - 0.2 %   Neutrophils Relative % 57 %   Neutro Abs 5.9 1.7 - 7.7 K/uL   Lymphocytes Relative 30 %   Lymphs Abs 3.1 0.7 - 4.0 K/uL   Monocytes Relative 10 %   Monocytes Absolute 1.0 0.1 - 1.0 K/uL   Eosinophils Relative 1 %   Eosinophils Absolute 0.1 0.0 - 0.5 K/uL   Basophils Relative 1 %   Basophils Absolute 0.1 0.0 - 0.1 K/uL   Immature  Granulocytes 1 %   Abs Immature Granulocytes 0.05 0.00 - 0.07 K/uL  Magnesium     Status: None   Collection Time: 01/04/21  4:35 AM  Result Value Ref Range   Magnesium 2.0 1.7 - 2.4 mg/dL    Recent Results (from the past 240 hour(s))  Resp Panel by RT-PCR (Flu A&B, Covid) Nasopharyngeal Swab     Status: None   Collection Time: 01/01/21  5:06 PM   Specimen: Nasopharyngeal Swab; Nasopharyngeal(NP) swabs in vial transport medium  Result Value Ref Range Status   SARS Coronavirus 2 by RT PCR NEGATIVE NEGATIVE Final    Comment: (NOTE) SARS-CoV-2 target nucleic acids are NOT DETECTED.  The SARS-CoV-2 RNA is generally detectable in upper respiratory specimens during the acute phase of infection. The lowest concentration of SARS-CoV-2 viral copies this assay can detect is 138 copies/mL. A negative result does not preclude SARS-Cov-2 infection and should not be used as the sole basis for treatment or other patient management decisions. A negative result may occur with  improper specimen collection/handling, submission of specimen other than nasopharyngeal swab, presence of viral mutation(s) within the areas targeted by this assay, and inadequate number of viral copies(<138 copies/mL). A negative result must be combined with clinical observations, patient history, and epidemiological information. The expected result is Negative.  Fact Sheet for Patients:  BloggerCourse.com  Fact Sheet for Healthcare Providers:  SeriousBroker.it  This test is no t yet approved or cleared by the [place] FDA and  has been authorized for detection and/or diagnosis of SARS-CoV-2 by FDA under an Emergency Use Authorization (EUA). This EUA will remain  in effect (meaning this test can be used) for the duration of the COVID-19 declaration under Section 564(b)(1) of the Act, 21 U.S.C.section 360bbb-3(b)(1), unless the authorization is terminated  or revoked  sooner.       Influenza A by PCR NEGATIVE NEGATIVE Final   Influenza B by PCR NEGATIVE NEGATIVE Final    Comment: (NOTE) The Xpert Xpress SARS-CoV-2/FLU/RSV plus assay is intended as an aid in the diagnosis of influenza from Nasopharyngeal swab specimens and should not be used as a sole basis for treatment. Nasal washings and aspirates are  unacceptable for Xpert Xpress SARS-CoV-2/FLU/RSV testing.  Fact Sheet for Patients: BloggerCourse.com  Fact Sheet for Healthcare Providers: SeriousBroker.it  This test is not yet approved or cleared by the [place] FDA and has been authorized for detection and/or diagnosis of SARS-CoV-2 by FDA under an Emergency Use Authorization (EUA). This EUA will remain in effect (meaning this test can be used) for the duration of the COVID-19 declaration under Section 564(b)(1) of the Act, 21 U.S.C. section 360bbb-3(b)(1), unless the authorization is terminated or revoked.  Performed at [facility], [address], [place], [place] [address]      Radiology Studies: No results found. MR BRAIN WO CONTRAST  Final Result    DG Chest Portable 1 View  Final Result    CT Head Wo Contrast  Final Result      Scheduled Meds: . amLODipine  10 mg Oral Daily  . enoxaparin (LOVENOX) injection  40 mg Subcutaneous Daily  . folic acid  1 mg Oral Daily  . hydrALAZINE  25 mg Oral TID  . hydrochlorothiazide  50 mg Oral Daily  . lisinopril  40 mg Oral Daily  . multivitamin with minerals  1 tablet Oral Daily  . nicotine  21 mg Transdermal Daily  . sodium chloride flush  3 mL Intravenous Q12H  . thiamine  100 mg Oral Daily   Or  . thiamine  100 mg Intravenous Daily  . [START ON 01/05/2021] vitamin B-12  1,000 mcg Oral Daily   PRN Meds: hydrALAZINE, labetalol, LORazepam **OR** LORazepam Continuous Infusions:   LOS: 3 days  Time spent: Greater than 50% of the 35 minute visit was  spent in counseling/coordination of care for the patient as laid out in the A&P.   Lewie Chamber, MD [facility] 01/04/2021, 1:46 PM

## 2021-01-05 LAB — CBC WITH DIFFERENTIAL/PLATELET
Abs Immature Granulocytes: 0.06 10*3/uL (ref 0.00–0.07)
Basophils Absolute: 0.1 10*3/uL (ref 0.0–0.1)
Basophils Relative: 1 %
Eosinophils Absolute: 0.2 10*3/uL (ref 0.0–0.5)
Eosinophils Relative: 2 %
HCT: 49.2 % (ref 39.0–52.0)
Hemoglobin: 16.5 g/dL (ref 13.0–17.0)
Immature Granulocytes: 1 %
Lymphocytes Relative: 34 %
Lymphs Abs: 3 10*3/uL (ref 0.7–4.0)
MCH: 30.4 pg (ref 26.0–34.0)
MCHC: 33.5 g/dL (ref 30.0–36.0)
MCV: 90.6 fL (ref 80.0–100.0)
Monocytes Absolute: 1 10*3/uL (ref 0.1–1.0)
Monocytes Relative: 11 %
Neutro Abs: 4.6 10*3/uL (ref 1.7–7.7)
Neutrophils Relative %: 51 %
Platelets: 229 10*3/uL (ref 150–400)
RBC: 5.43 MIL/uL (ref 4.22–5.81)
RDW: 13.6 % (ref 11.5–15.5)
WBC: 8.9 10*3/uL (ref 4.0–10.5)
nRBC: 0 % (ref 0.0–0.2)

## 2021-01-05 LAB — BASIC METABOLIC PANEL
Anion gap: 9 (ref 5–15)
BUN: 21 mg/dL — ABNORMAL HIGH (ref 6–20)
CO2: 26 mmol/L (ref 22–32)
Calcium: 9.6 mg/dL (ref 8.9–10.3)
Chloride: 100 mmol/L (ref 98–111)
Creatinine, Ser: 1.09 mg/dL (ref 0.61–1.24)
GFR, Estimated: >60 mL/min (ref >60)
Glucose, Bld: 99 mg/dL (ref 70–99)
Potassium: 4.4 mmol/L (ref 3.5–5.1)
Sodium: 135 mmol/L (ref 135–145)

## 2021-01-05 LAB — MAGNESIUM: Magnesium: 2.1 mg/dL (ref 1.7–2.4)

## 2021-01-05 MED ORDER — LISINOPRIL 20 MG PO TABS: 40.0000 mg | ORAL_TABLET | Freq: Every day | ORAL | 0 refills | Status: DC

## 2021-01-05 MED ORDER — HYDRALAZINE HCL 25 MG PO TABS: 25.0000 mg | ORAL_TABLET | Freq: Three times a day (TID) | ORAL | 0 refills | Status: DC

## 2021-01-05 MED ORDER — ADULT MULTIVITAMIN W/MINERALS CH: 1.0000 | ORAL_TABLET | Freq: Every day | ORAL | 0 refills | Status: AC

## 2021-01-05 MED ORDER — HYDROCHLOROTHIAZIDE 25 MG PO TABS
50.0000 mg | ORAL_TABLET | Freq: Every day | ORAL | 0 refills | Status: DC
Start: 2021-01-05 — End: 2021-11-22

## 2021-01-05 MED ORDER — HYDROCHLOROTHIAZIDE 25 MG PO TABS: 50.0000 mg | ORAL_TABLET | Freq: Every day | ORAL | 0 refills | Status: DC

## 2021-01-05 MED ORDER — NICOTINE 21 MG/24HR TD PT24: 21.0000 mg | MEDICATED_PATCH | Freq: Every day | TRANSDERMAL | 0 refills | Status: DC

## 2021-01-05 MED ORDER — ADULT MULTIVITAMIN W/MINERALS CH: 1.0000 | ORAL_TABLET | Freq: Every day | ORAL | 0 refills | Status: DC

## 2021-01-05 MED ORDER — AMLODIPINE BESYLATE 10 MG PO TABS: 10.0000 mg | ORAL_TABLET | Freq: Every day | ORAL | 0 refills | Status: DC

## 2021-01-05 NOTE — Discharge Summary (Signed)
Physician Discharge Summary  Osbaldo Mark GQQ:[id number] DOB: 07-05-1971 DOA: 01/01/2021  PCP: Pcp, No  Admit date: 01/01/2021 Discharge date: 01/05/2021  Admitted From: Home Disposition: Home   Recommendations for Outpatient Follow-up:  1. Follow up with PCP in 1-2 weeks 2. Please obtain BMP/CBC in one week 3. Please keep track of home blood pressure readings to report back to your primary physician  Home Health: None Equipment/Devices: None  Discharge Condition: Stable CODE STATUS: Full Diet recommendation: Low-salt low-fat diet  Brief/Interim Summary: Mr. Muilenburg is a 50 year old male with PMH uncontrolled hypertension, COPD, ongoing tobacco use, ongoing alcohol use, and occasional illicit drug use.  He does not routinely follow with a provider outpatient and takes no medications at home. He presented to the ER with complaints of the room spinning that has not improved over the past 2 days.  He states that last night his symptoms became constant and would not go away and he has been staggering since. He states that he went to [employer] 2 days ago and felt dizzy initially at that time then went home and drank a sixpack because he felt wobbly.  Symptoms did not improve. He also has been complaining of double vision and pressure behind his right eye.  This morning he had nausea and multiple episodes of vomiting/dry heaving. He denies any associated chest pain, shortness of breath, headaches.  He endorses tingling sensations in his hands and fingers intermittently which have been going on a few months.  Denies any sensory changes in his legs nor any focal weakness anywhere.  Patient admitted as above with acute hypertensive urgency likely in the setting of medication noncompliance versus poor follow-up.  He states he had not been taking his medications as he ran out of refills, no longer has a primary care physician at the [facility] for unclear reasons.  Regardless patient was admitted with complaints of  vertigo likely in the setting of hypertensive emergency/urgency and blood pressure was well controlled after reinitiation of previous home medications as well as new additional medications.  As below patient's blood pressure is now well controlled.  In light of patient's vertigo he was evaluated for possible stroke given CT and MRI of the head and brain respectively were unremarkable and resolved with blood pressure control no further work-up was indicated.  Patient counseled at length about need for illicit substance, alcohol and tobacco cessation given his known history and poorly controlled blood pressure.  Patient otherwise stable and agreeable for discharge home with close follow-up with his new PCP at the [place].   Discharge Diagnoses:  Principal Problem:   Hypertensive urgency Active Problems:   Tobacco use   Alcohol use   Substance use   Vertigo   Resistant hypertension    Discharge Instructions  Discharge Instructions    Call MD for:  persistant dizziness or light-headedness   Complete by: As directed    Diet - low sodium heart healthy   Complete by: As directed    Increase activity slowly   Complete by: As directed      Allergies as of 01/05/2021   No Known Allergies     Medication List    STOP taking these medications   acetaminophen 500 MG tablet Commonly known as: TYLENOL     TAKE these medications   amLODipine 10 MG tablet Commonly known as: NORVASC Take 1 tablet (10 mg total) by mouth daily.   hydrALAZINE 25 MG tablet Commonly known as: APRESOLINE Take 1 tablet (25 mg total)  by mouth 3 (three) times daily.   hydrochlorothiazide 25 MG tablet Commonly known as: HYDRODIURIL Take 2 tablets (50 mg total) by mouth daily.   lisinopril 20 MG tablet Commonly known as: ZESTRIL Take 2 tablets (40 mg total) by mouth daily.   multivitamin with minerals Tabs tablet Take 1 tablet by mouth daily.   nicotine 21 mg/24hr patch Commonly known as: NICODERM CQ - dosed in  mg/24 hours Place 1 patch (21 mg total) onto the skin daily.       No Known Allergies  Consultations: None  Procedures/Studies: CT Head Wo Contrast  Result Date: 01/01/2021 CLINICAL DATA:  Dizziness, nausea, vomiting since last night EXAM: CT HEAD WITHOUT CONTRAST TECHNIQUE: Contiguous axial images were obtained from the base of the skull through the vertex without intravenous contrast. COMPARISON:  None. FINDINGS: Brain: No evidence of acute infarction, hemorrhage, hydrocephalus, extra-axial collection or mass lesion/mass effect. Vascular: No hyperdense vessel or unexpected calcification. Skull: No osseous abnormality. Sinuses/Orbits: Small right maxillary sinus mucous retention cyst. Visualized mastoid sinuses are clear. Visualized orbits demonstrate no focal abnormality. Other: None IMPRESSION: No acute intracranial pathology. Electronically Signed   By: Elige KoHetal  Patel   On: 01/01/2021 12:28   MR BRAIN WO CONTRAST  Result Date: 01/01/2021 CLINICAL DATA:  Nonspecific dizziness. EXAM: MRI HEAD WITHOUT CONTRAST TECHNIQUE: Multiplanar, multiecho pulse sequences of the brain and surrounding structures were obtained without intravenous contrast. COMPARISON:  Head CT same day FINDINGS: Brain: Diffusion imaging does not show any acute or subacute infarction. No focal abnormality affects the brainstem. No focal cerebellar finding. Cerebral hemispheres show scattered punctate foci of T2 and FLAIR signal within the white matter consistent with minimal small vessel change. No cortical or large vessel territory infarction. No intra-axial mass lesion, hemorrhage, hydrocephalus or extra-axial collection. Vascular: Normal Skull and upper cervical spine: Normal appearance of the bones. 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 Sinuses/Orbits: Clear/normal Other: No fluid in the middle ears or mastoids. IMPRESSION: No abnormality seen to explain dizziness. No acute finding.  Mild chronic small-vessel ischemic change of the hemispheric white matter. Electronically Signed   By: Paulina FusiMark  Shogry M.D.   On: 01/01/2021 16:27   DG Chest Portable 1 View  Result Date: 01/01/2021 CLINICAL DATA:  Shortness of breath.  Nausea vomiting. EXAM: PORTABLE CHEST 1 VIEW COMPARISON:  None. FINDINGS: The heart size and mediastinal contours are within normal limits. Both lungs are clear. The visualized skeletal structures are unremarkable. IMPRESSION: No active disease. Electronically Signed   By: Gerome Samavid  Williams III M.D   On: 01/01/2021 14:14   ECHOCARDIOGRAM COMPLETE  Result Date: 01/02/2021    ECHOCARDIOGRAM REPORT   Patient Name:   Garfield CorneaWILLIAM Janusz Date of Exam: 01/02/2021 Medical Rec #:  [id number]      Height:       71.0 in Accession #:    [id number]     Weight:       213.0 lb Date of Birth:  08-14-1971      BSA:          2.166 m Patient Age:    50 years       BP:           138/85 mmHg Patient Gender: M              HR:           76 bpm. Exam Location:  Inpatient Procedure: 2D Echo, Cardiac Doppler and Color Doppler Indications:  I10 Hypertension  History:        Patient has prior history of Echocardiogram examinations, most                 recent 10/14/2019. CHF and Cardiomyopathy, Previous Myocardial                 Infarction, COPD, Signs/Symptoms:Dyspnea; Risk Factors:Former                 Smoker, Dyslipidemia and Hypertension. CKD. GERD. Abdominal                 Aortic Aneurysm.  Sonographer:    Elmarie Shiley Dance Referring Phys: [id number] DAVID GIRGUIS IMPRESSIONS  1. Left ventricular ejection fraction, by estimation, is 60 to 65%. The left ventricle has normal function. The left ventricle has no regional wall motion abnormalities. There is mild concentric left ventricular hypertrophy. Left ventricular diastolic parameters are consistent with Grade II diastolic dysfunction (pseudonormalization).  2. Right ventricular systolic function is normal. The right ventricular size is normal.  3. Left atrial size  was mildly dilated.  4. A small pericardial effusion is present. The pericardial effusion is circumferential.  5. The mitral valve is normal in structure. No evidence of mitral valve regurgitation. No evidence of mitral stenosis.  6. The aortic valve is normal in structure. Aortic valve regurgitation is not visualized. No aortic stenosis is present.  7. The inferior vena cava is normal in size with greater than 50% respiratory variability, suggesting right atrial pressure of 3 mmHg. FINDINGS  Left Ventricle: Left ventricular ejection fraction, by estimation, is 60 to 65%. The left ventricle has normal function. The left ventricle has no regional wall motion abnormalities. The left ventricular internal cavity size was normal in size. There is  mild concentric left ventricular hypertrophy. Left ventricular diastolic parameters are consistent with Grade II diastolic dysfunction (pseudonormalization). Normal left ventricular filling pressure. Right Ventricle: The right ventricular size is normal. No increase in right ventricular wall thickness. Right ventricular systolic function is normal. Left Atrium: Left atrial size was mildly dilated. Right Atrium: Right atrial size was normal in size. Pericardium: A small pericardial effusion is present. The pericardial effusion is circumferential. Mitral Valve: The mitral valve is normal in structure. Mild mitral annular calcification. No evidence of mitral valve regurgitation. No evidence of mitral valve stenosis. Tricuspid Valve: The tricuspid valve is normal in structure. Tricuspid valve regurgitation is not demonstrated. No evidence of tricuspid stenosis. Aortic Valve: The aortic valve is normal in structure. Aortic valve regurgitation is not visualized. No aortic stenosis is present. Pulmonic Valve: The pulmonic valve was normal in structure. Pulmonic valve regurgitation is not visualized. No evidence of pulmonic stenosis. Aorta: The aortic root is normal in size and  structure. Venous: The inferior vena cava is normal in size with greater than 50% respiratory variability, suggesting right atrial pressure of 3 mmHg. IAS/Shunts: No atrial level shunt detected by color flow Doppler.  LEFT VENTRICLE PLAX 2D LVIDd:         4.60 cm  Diastology LVIDs:         3.60 cm  LV e' medial:    6.20 cm/s LV PW:         1.20 cm  LV E/e' medial:  13.6 LV IVS:        1.20 cm  LV e' lateral:   8.16 cm/s LVOT diam:     2.10 cm  LV E/e' lateral: 10.3 LV SV:  91 LV SV Index:   42 LVOT Area:     3.46 cm  RIGHT VENTRICLE             IVC RV Basal diam:  2.90 cm     IVC diam: 1.90 cm RV S prime:     13.30 cm/s TAPSE (M-mode): 2.3 cm LEFT ATRIUM              Index       RIGHT ATRIUM           Index LA diam:        3.80 cm  1.75 cm/m  RA Area:     19.00 cm LA Vol (A2C):   106.0 ml 48.94 ml/m RA Volume:   52.40 ml  24.19 ml/m LA Vol (A4C):   54.0 ml  24.93 ml/m LA Biplane Vol: 79.8 ml  36.85 ml/m  AORTIC VALVE LVOT Vmax:   134.00 cm/s LVOT Vmean:  86.900 cm/s LVOT VTI:    0.262 m  AORTA Ao Root diam: 3.50 cm Ao Asc diam:  3.60 cm MITRAL VALVE MV Area (PHT): 2.73 cm    SHUNTS MV Decel Time: 278 msec    Systemic VTI:  0.26 m MV E velocity: 84.40 cm/s  Systemic Diam: 2.10 cm MV A velocity: 84.40 cm/s MV E/A ratio:  1.00 Armanda Magic MD Electronically signed by Armanda Magic MD Signature Date/Time: 01/02/2021/11:25:47 AM    Final      Subjective: No acute issues or events overnight   Discharge Exam: Vitals:   01/05/21 0500 01/05/21 0907  BP: (!) 147/94 (!) 157/99  Pulse: 71 92  Resp: 18 20  Temp: (!) 97.5 F (36.4 C) 97.6 F (36.4 C)  SpO2: 94% 98%   Vitals:   01/04/21 2013 01/04/21 2202 01/05/21 0500 01/05/21 0907  BP: 140/89 133/87 (!) 147/94 (!) 157/99  Pulse: 87 83 71 92  Resp: Temp: 97.6 F (36.4 C)  (!) 97.5 F (36.4 C) 97.6 F (36.4 C)  TempSrc: Oral  Oral Oral  SpO2: 93% 95% 94% 98%  Weight:      Height:        General: Pt is alert, awake, not in  acute distress Cardiovascular: RRR, S1/S2 +, no rubs, no gallops Respiratory: CTA bilaterally, no wheezing, no rhonchi Abdominal: Soft, NT, ND, bowel sounds + Extremities: no edema, no cyanosis    The results of significant diagnostics from this hospitalization (including imaging, microbiology, ancillary and laboratory) are listed below for reference.     Microbiology: Recent Results (from the past 240 hour(s))  Resp Panel by RT-PCR (Flu A&B, Covid) Nasopharyngeal Swab     Status: None   Collection Time: 01/01/21  5:06 PM   Specimen: Nasopharyngeal Swab; Nasopharyngeal(NP) swabs in vial transport medium  Result Value Ref Range Status   SARS Coronavirus 2 by RT PCR NEGATIVE NEGATIVE Final    Comment: (NOTE) SARS-CoV-2 target nucleic acids are NOT DETECTED.  The SARS-CoV-2 RNA is generally detectable in upper respiratory specimens during the acute phase of infection. The lowest concentration of SARS-CoV-2 viral copies this assay can detect is 138 copies/mL. A negative result does not preclude SARS-Cov-2 infection and should not be used as the sole basis for treatment or other patient management decisions. A negative result may occur with  improper specimen collection/handling, submission of specimen other than nasopharyngeal swab, presence of viral mutation(s) within the areas targeted by this assay, and inadequate number of viral copies(<138 copies/mL).  A negative result must be combined with clinical observations, patient history, and epidemiological information. The expected result is Negative.  Fact Sheet for Patients:  BloggerCourse.com  Fact Sheet for Healthcare Providers:  SeriousBroker.it  This test is no t yet approved or cleared by the [place] FDA and  has been authorized for detection and/or diagnosis of SARS-CoV-2 by FDA under an Emergency Use Authorization (EUA). This EUA will remain  in effect (meaning this  test can be used) for the duration of the COVID-19 declaration under Section 564(b)(1) of the Act, 21 U.S.C.section 360bbb-3(b)(1), unless the authorization is terminated  or revoked sooner.       Influenza A by PCR NEGATIVE NEGATIVE Final   Influenza B by PCR NEGATIVE NEGATIVE Final    Comment: (NOTE) The Xpert Xpress SARS-CoV-2/FLU/RSV plus assay is intended as an aid in the diagnosis of influenza from Nasopharyngeal swab specimens and should not be used as a sole basis for treatment. Nasal washings and aspirates are unacceptable for Xpert Xpress SARS-CoV-2/FLU/RSV testing.  Fact Sheet for Patients: BloggerCourse.com  Fact Sheet for Healthcare Providers: SeriousBroker.it  This test is not yet approved or cleared by the [place] FDA and has been authorized for detection and/or diagnosis of SARS-CoV-2 by FDA under an Emergency Use Authorization (EUA). This EUA will remain in effect (meaning this test can be used) for the duration of the COVID-19 declaration under Section 564(b)(1) of the Act, 21 U.S.C. section 360bbb-3(b)(1), unless the authorization is terminated or revoked.  Performed at [facility], [address] [place], [place] [address]      Labs: BNP (last 3 results) No results for input(s): BNP in the last 8760 hours. Basic Metabolic Panel: Recent Labs  Lab 01/01/21 1131 01/02/21 0536 01/03/21 0506 01/04/21 0435 01/05/21 0420  NA 136 136 142 137 135  K 4.2 3.9 4.0 4.2 4.4  CL 103 103 104 99 100  CO2 24 25 28 28 26   GLUCOSE 117* 116* 103* 105* 99  BUN 9 13 13 17  21*  CREATININE 0.80 0.89 0.86 1.09 1.09  CALCIUM 9.4 9.3 9.4 9.7 9.6  MG  --  2.3 2.2 2.0 2.1   Liver Function Tests: Recent Labs  Lab 01/01/21 1131  AST 27  ALT 34  ALKPHOS 80  BILITOT 0.7  PROT 8.2*  ALBUMIN 4.2   No results for input(s): LIPASE, AMYLASE in the last 168 hours. No results for input(s):  AMMONIA in the last 168 hours. CBC: Recent Labs  Lab 01/01/21 1131 01/02/21 0536 01/03/21 0506 01/04/21 0435 01/05/21 0420  WBC 10.1 9.1 9.3 10.2 8.9  NEUTROABS 7.6 5.2 5.3 5.9 4.6  HGB 16.0 14.7 15.6 15.6 16.5  HCT 47.5 43.3 46.6 47.0 49.2  MCV 89.0 89.1 91.4 90.9 90.6  PLT 279 260 251 252 229   Cardiac Enzymes: No results for input(s): CKTOTAL, CKMB, CKMBINDEX, TROPONINI in the last 168 hours. BNP: Invalid input(s): POCBNP CBG: No results for input(s): GLUCAP in the last 168 hours. D-Dimer No results for input(s): DDIMER in the last 72 hours. Hgb A1c Recent Labs    01/03/21 0506  HGBA1C 5.8*   Lipid Profile No results for input(s): CHOL, HDL, LDLCALC, TRIG, CHOLHDL, LDLDIRECT in the last 72 hours. Thyroid function studies No results for input(s): TSH, T4TOTAL, T3FREE, THYROIDAB in the last 72 hours.  Invalid input(s): FREET3 Anemia work up No results for input(s): VITAMINB12, FOLATE, FERRITIN, TIBC, IRON, RETICCTPCT in the last 72 hours. Urinalysis No results found for:  COLORURINE, APPEARANCEUR, LABSPEC, PHURINE, GLUCOSEU, HGBUR, BILIRUBINUR, KETONESUR, PROTEINUR, UROBILINOGEN, NITRITE, LEUKOCYTESUR Sepsis Labs Invalid input(s): PROCALCITONIN,  WBC,  LACTICIDVEN Microbiology Recent Results (from the past 240 hour(s))  Resp Panel by RT-PCR (Flu A&B, Covid) Nasopharyngeal Swab     Status: None   Collection Time: 01/01/21  5:06 PM   Specimen: Nasopharyngeal Swab; Nasopharyngeal(NP) swabs in vial transport medium  Result Value Ref Range Status   SARS Coronavirus 2 by RT PCR NEGATIVE NEGATIVE Final    Comment: (NOTE) SARS-CoV-2 target nucleic acids are NOT DETECTED.  The SARS-CoV-2 RNA is generally detectable in upper respiratory specimens during the acute phase of infection. The lowest concentration of SARS-CoV-2 viral copies this assay can detect is 138 copies/mL. A negative result does not preclude SARS-Cov-2 infection and should not be used as the sole basis  for treatment or other patient management decisions. A negative result may occur with  improper specimen collection/handling, submission of specimen other than nasopharyngeal swab, presence of viral mutation(s) within the areas targeted by this assay, and inadequate number of viral copies(<138 copies/mL). A negative result must be combined with clinical observations, patient history, and epidemiological information. The expected result is Negative.  Fact Sheet for Patients:  BloggerCourse.com  Fact Sheet for Healthcare Providers:  SeriousBroker.it  This test is no t yet approved or cleared by the [place] FDA and  has been authorized for detection and/or diagnosis of SARS-CoV-2 by FDA under an Emergency Use Authorization (EUA). This EUA will remain  in effect (meaning this test can be used) for the duration of the COVID-19 declaration under Section 564(b)(1) of the Act, 21 U.S.C.section 360bbb-3(b)(1), unless the authorization is terminated  or revoked sooner.       Influenza A by PCR NEGATIVE NEGATIVE Final   Influenza B by PCR NEGATIVE NEGATIVE Final    Comment: (NOTE) The Xpert Xpress SARS-CoV-2/FLU/RSV plus assay is intended as an aid in the diagnosis of influenza from Nasopharyngeal swab specimens and should not be used as a sole basis for treatment. Nasal washings and aspirates are unacceptable for Xpert Xpress SARS-CoV-2/FLU/RSV testing.  Fact Sheet for Patients: BloggerCourse.com  Fact Sheet for Healthcare Providers: SeriousBroker.it  This test is not yet approved or cleared by the [place] FDA and has been authorized for detection and/or diagnosis of SARS-CoV-2 by FDA under an Emergency Use Authorization (EUA). This EUA will remain in effect (meaning this test can be used) for the duration of the COVID-19 declaration under Section 564(b)(1) of the Act, 21  U.S.C. section 360bbb-3(b)(1), unless the authorization is terminated or revoked.  Performed at [facility], [address], [place], [place] [address]      Time coordinating discharge: Over 30 minutes  SIGNED:   Azucena Fallen, DO [facility] 01/05/2021, 6:41 PM Pager   If 7PM-7AM, please contact night-coverage [email or web address]

## 2021-11-20 ENCOUNTER — Inpatient Hospital Stay (HOSPITAL_COMMUNITY): Payer: No Typology Code available for payment source

## 2021-11-20 ENCOUNTER — Emergency Department (HOSPITAL_COMMUNITY): Payer: No Typology Code available for payment source

## 2021-11-20 ENCOUNTER — Other Ambulatory Visit: Payer: Self-pay

## 2021-11-20 ENCOUNTER — Inpatient Hospital Stay (HOSPITAL_COMMUNITY)
Admission: EM | Admit: 2021-11-20 | Discharge: 2021-11-22 | DRG: 065 | Disposition: A | Discharge status: Home / Self Care | Payer: No Typology Code available for payment source | Attending: Family Medicine | Admitting: Family Medicine

## 2021-11-20 ENCOUNTER — Encounter (HOSPITAL_COMMUNITY): Payer: Self-pay | Admitting: Emergency Medicine

## 2021-11-20 DIAGNOSIS — R202 Paresthesia of skin: Secondary | ICD-10-CM | POA: Diagnosis present

## 2021-11-20 DIAGNOSIS — R200 Anesthesia of skin: Secondary | ICD-10-CM | POA: Diagnosis present

## 2021-11-20 DIAGNOSIS — Z720 Tobacco use: Secondary | ICD-10-CM | POA: Diagnosis present

## 2021-11-20 DIAGNOSIS — Z789 Other specified health status: Secondary | ICD-10-CM | POA: Diagnosis present

## 2021-11-20 DIAGNOSIS — F1010 Alcohol abuse, uncomplicated: Secondary | ICD-10-CM | POA: Diagnosis present

## 2021-11-20 DIAGNOSIS — J449 Chronic obstructive pulmonary disease, unspecified: Secondary | ICD-10-CM | POA: Diagnosis present

## 2021-11-20 DIAGNOSIS — Z8249 Family history of ischemic heart disease and other diseases of the circulatory system: Secondary | ICD-10-CM | POA: Diagnosis not present

## 2021-11-20 DIAGNOSIS — I110 Hypertensive heart disease with heart failure: Secondary | ICD-10-CM | POA: Diagnosis present

## 2021-11-20 DIAGNOSIS — I6782 Cerebral ischemia: Secondary | ICD-10-CM | POA: Diagnosis present

## 2021-11-20 DIAGNOSIS — Z20822 Contact with and (suspected) exposure to covid-19: Secondary | ICD-10-CM | POA: Diagnosis present

## 2021-11-20 DIAGNOSIS — I5032 Chronic diastolic (congestive) heart failure: Secondary | ICD-10-CM | POA: Diagnosis present

## 2021-11-20 DIAGNOSIS — R270 Ataxia, unspecified: Secondary | ICD-10-CM | POA: Diagnosis present

## 2021-11-20 DIAGNOSIS — H538 Other visual disturbances: Secondary | ICD-10-CM | POA: Diagnosis present

## 2021-11-20 DIAGNOSIS — F17210 Nicotine dependence, cigarettes, uncomplicated: Secondary | ICD-10-CM | POA: Diagnosis present

## 2021-11-20 DIAGNOSIS — Z91199 Patient's noncompliance with other medical treatment and regimen due to unspecified reason: Secondary | ICD-10-CM | POA: Diagnosis not present

## 2021-11-20 DIAGNOSIS — E669 Obesity, unspecified: Secondary | ICD-10-CM | POA: Diagnosis present

## 2021-11-20 DIAGNOSIS — I639 Cerebral infarction, unspecified: Principal | ICD-10-CM | POA: Diagnosis present

## 2021-11-20 DIAGNOSIS — Z833 Family history of diabetes mellitus: Secondary | ICD-10-CM

## 2021-11-20 DIAGNOSIS — I6302 Cerebral infarction due to thrombosis of basilar artery: Secondary | ICD-10-CM | POA: Diagnosis not present

## 2021-11-20 DIAGNOSIS — F1290 Cannabis use, unspecified, uncomplicated: Secondary | ICD-10-CM | POA: Diagnosis present

## 2021-11-20 DIAGNOSIS — I1A0 Resistant hypertension: Secondary | ICD-10-CM | POA: Diagnosis present

## 2021-11-20 DIAGNOSIS — I6389 Other cerebral infarction: Secondary | ICD-10-CM | POA: Diagnosis not present

## 2021-11-20 DIAGNOSIS — F1090 Alcohol use, unspecified, uncomplicated: Secondary | ICD-10-CM | POA: Diagnosis present

## 2021-11-20 DIAGNOSIS — F1590 Other stimulant use, unspecified, uncomplicated: Secondary | ICD-10-CM | POA: Diagnosis present

## 2021-11-20 DIAGNOSIS — Z66 Do not resuscitate: Secondary | ICD-10-CM | POA: Diagnosis present

## 2021-11-20 DIAGNOSIS — Z79899 Other long term (current) drug therapy: Secondary | ICD-10-CM

## 2021-11-20 DIAGNOSIS — G510 Bell's palsy: Secondary | ICD-10-CM | POA: Diagnosis present

## 2021-11-20 DIAGNOSIS — I6329 Cerebral infarction due to unspecified occlusion or stenosis of other precerebral arteries: Secondary | ICD-10-CM | POA: Diagnosis present

## 2021-11-20 DIAGNOSIS — I10 Essential (primary) hypertension: Secondary | ICD-10-CM

## 2021-11-20 LAB — COMPREHENSIVE METABOLIC PANEL
ALT: 44 U/L (ref 0–44)
AST: 34 U/L (ref 15–41)
Albumin: 3.7 g/dL (ref 3.5–5.0)
Alkaline Phosphatase: 73 U/L (ref 38–126)
Anion gap: 10 (ref 5–15)
BUN: 9 mg/dL (ref 6–20)
CO2: 23 mmol/L (ref 22–32)
Calcium: 8.6 mg/dL — ABNORMAL LOW (ref 8.9–10.3)
Chloride: 102 mmol/L (ref 98–111)
Creatinine, Ser: 0.84 mg/dL (ref 0.61–1.24)
GFR, Estimated: >60 mL/min (ref >60)
Glucose, Bld: 144 mg/dL — ABNORMAL HIGH (ref 70–99)
Potassium: 3.6 mmol/L (ref 3.5–5.1)
Sodium: 135 mmol/L (ref 135–145)
Total Bilirubin: 0.5 mg/dL (ref 0.3–1.2)
Total Protein: 7.3 g/dL (ref 6.5–8.1)

## 2021-11-20 LAB — CBC
HCT: 45 % (ref 39.0–52.0)
Hemoglobin: 15.3 g/dL (ref 13.0–17.0)
MCH: 29.7 pg (ref 26.0–34.0)
MCHC: 34 g/dL (ref 30.0–36.0)
MCV: 87.4 fL (ref 80.0–100.0)
Platelets: 213 10*3/uL (ref 150–400)
RBC: 5.15 MIL/uL (ref 4.22–5.81)
RDW: 13.5 % (ref 11.5–15.5)
WBC: 8.8 10*3/uL (ref 4.0–10.5)
nRBC: 0 % (ref 0.0–0.2)

## 2021-11-20 LAB — DIFFERENTIAL
Abs Immature Granulocytes: 0 10*3/uL (ref 0.00–0.07)
Basophils Absolute: 0 10*3/uL (ref 0.0–0.1)
Basophils Relative: 0 %
Eosinophils Absolute: 0.2 10*3/uL (ref 0.0–0.5)
Eosinophils Relative: 2 %
Lymphocytes Relative: 45 %
Lymphs Abs: 4 10*3/uL (ref 0.7–4.0)
Monocytes Absolute: 0.5 10*3/uL (ref 0.1–1.0)
Monocytes Relative: 6 %
Neutro Abs: 4.1 10*3/uL (ref 1.7–7.7)
Neutrophils Relative %: 47 %
nRBC: 0 /100 WBC

## 2021-11-20 LAB — URINALYSIS, ROUTINE W REFLEX MICROSCOPIC
Bilirubin Urine: NEGATIVE
Glucose, UA: NEGATIVE mg/dL
Hgb urine dipstick: NEGATIVE
Ketones, ur: NEGATIVE mg/dL
Leukocytes,Ua: NEGATIVE
Nitrite: NEGATIVE
Protein, ur: NEGATIVE mg/dL
Specific Gravity, Urine: 1.015 (ref 1.005–1.030)
pH: 6 (ref 5.0–8.0)

## 2021-11-20 LAB — CBC WITH DIFFERENTIAL/PLATELET
Abs Immature Granulocytes: 0.05 10*3/uL (ref 0.00–0.07)
Basophils Absolute: 0.1 10*3/uL (ref 0.0–0.1)
Basophils Relative: 1 %
Eosinophils Absolute: 0.1 10*3/uL (ref 0.0–0.5)
Eosinophils Relative: 1 %
HCT: 44.6 % (ref 39.0–52.0)
Hemoglobin: 15.4 g/dL (ref 13.0–17.0)
Immature Granulocytes: 1 %
Lymphocytes Relative: 29 %
Lymphs Abs: 3 10*3/uL (ref 0.7–4.0)
MCH: 30.1 pg (ref 26.0–34.0)
MCHC: 34.5 g/dL (ref 30.0–36.0)
MCV: 87.3 fL (ref 80.0–100.0)
Monocytes Absolute: 0.8 10*3/uL (ref 0.1–1.0)
Monocytes Relative: 8 %
Neutro Abs: 6.4 10*3/uL (ref 1.7–7.7)
Neutrophils Relative %: 60 %
Platelets: 198 10*3/uL (ref 150–400)
RBC: 5.11 MIL/uL (ref 4.22–5.81)
RDW: 13.3 % (ref 11.5–15.5)
WBC: 10.3 10*3/uL (ref 4.0–10.5)
nRBC: 0 % (ref 0.0–0.2)

## 2021-11-20 LAB — MAGNESIUM: Magnesium: 2.1 mg/dL (ref 1.7–2.4)

## 2021-11-20 LAB — RESP PANEL BY RT-PCR (FLU A&B, COVID) ARPGX2
Influenza A by PCR: NEGATIVE
Influenza B by PCR: NEGATIVE
SARS Coronavirus 2 by RT PCR: NEGATIVE

## 2021-11-20 LAB — I-STAT CHEM 8, ED
BUN: 7 mg/dL (ref 6–20)
Calcium, Ion: 1.17 mmol/L (ref 1.15–1.40)
Chloride: 102 mmol/L (ref 98–111)
Creatinine, Ser: 0.8 mg/dL (ref 0.61–1.24)
Glucose, Bld: 148 mg/dL — ABNORMAL HIGH (ref 70–99)
HCT: 43 % (ref 39.0–52.0)
Hemoglobin: 14.6 g/dL (ref 13.0–17.0)
Potassium: 3.5 mmol/L (ref 3.5–5.1)
Sodium: 138 mmol/L (ref 135–145)
TCO2: 25 mmol/L (ref 22–32)

## 2021-11-20 LAB — HEPATIC FUNCTION PANEL
ALT: 45 U/L — ABNORMAL HIGH (ref 0–44)
AST: 38 U/L (ref 15–41)
Albumin: 3.6 g/dL (ref 3.5–5.0)
Alkaline Phosphatase: 68 U/L (ref 38–126)
Bilirubin, Direct: <0.1 mg/dL (ref 0.0–0.2)
Total Bilirubin: 0.6 mg/dL (ref 0.3–1.2)
Total Protein: 7.1 g/dL (ref 6.5–8.1)

## 2021-11-20 LAB — CK: Total CK: 172 U/L (ref 49–397)

## 2021-11-20 LAB — PROTIME-INR
INR: 0.9 (ref 0.8–1.2)
Prothrombin Time: 12.2 seconds (ref 11.4–15.2)

## 2021-11-20 LAB — TROPONIN I (HIGH SENSITIVITY): Troponin I (High Sensitivity): 18 ng/L — ABNORMAL HIGH (ref <18)

## 2021-11-20 LAB — RAPID URINE DRUG SCREEN, HOSP PERFORMED
Amphetamines: NOT DETECTED
Barbiturates: NOT DETECTED
Benzodiazepines: NOT DETECTED
Cocaine: NOT DETECTED
Opiates: NOT DETECTED
Tetrahydrocannabinol: NOT DETECTED

## 2021-11-20 LAB — TSH: TSH: 2.206 u[IU]/mL (ref 0.350–4.500)

## 2021-11-20 LAB — ETHANOL: Alcohol, Ethyl (B): <10 mg/dL (ref <10)

## 2021-11-20 LAB — APTT: aPTT: 31 seconds (ref 24–36)

## 2021-11-20 LAB — PHOSPHORUS: Phosphorus: 4.6 mg/dL (ref 2.5–4.6)

## 2021-11-20 MED ORDER — THIAMINE HCL 100 MG PO TABS
100.0000 mg | ORAL_TABLET | Freq: Every day | ORAL | Status: DC
  Administered 2021-11-21 – 2021-11-22 (×2): 100 mg via ORAL
  Filled 2021-11-20 (×2): qty 1

## 2021-11-20 MED ORDER — ACETAMINOPHEN 325 MG PO TABS: 650.0000 mg | ORAL_TABLET | ORAL | Status: DC | PRN

## 2021-11-20 MED ORDER — ACETAMINOPHEN 160 MG/5ML PO SOLN: 650.0000 mg | ORAL | Status: DC | PRN

## 2021-11-20 MED ORDER — ADULT MULTIVITAMIN W/MINERALS CH
1.0000 | ORAL_TABLET | Freq: Every day | ORAL | Status: DC
  Administered 2021-11-21 – 2021-11-22 (×2): 1 via ORAL
  Filled 2021-11-20 (×2): qty 1

## 2021-11-20 MED ORDER — THIAMINE HCL 100 MG/ML IJ SOLN: 100.0000 mg | Freq: Every day | INTRAMUSCULAR | Status: DC

## 2021-11-20 MED ORDER — CLOPIDOGREL BISULFATE 300 MG PO TABS
300.0000 mg | ORAL_TABLET | Freq: Once | ORAL | Status: AC
Start: 2021-11-20 — End: 2021-11-20
  Administered 2021-11-20: 300 mg via ORAL
  Filled 2021-11-20: qty 1

## 2021-11-20 MED ORDER — LORAZEPAM 1 MG PO TABS: 1.0000 mg | ORAL_TABLET | ORAL | Status: DC | PRN

## 2021-11-20 MED ORDER — STROKE: EARLY STAGES OF RECOVERY BOOK
Freq: Once | Status: AC
  Filled 2021-11-20: qty 1

## 2021-11-20 MED ORDER — ACETAMINOPHEN 650 MG RE SUPP: 650.0000 mg | RECTAL | Status: DC | PRN

## 2021-11-20 MED ORDER — IOHEXOL 350 MG/ML SOLN
100.0000 mL | Freq: Once | INTRAVENOUS | Status: AC | PRN
Start: 2021-11-20 — End: 2021-11-20
  Administered 2021-11-20: 100 mL via INTRAVENOUS

## 2021-11-20 MED ORDER — NICOTINE 21 MG/24HR TD PT24
21.0000 mg | MEDICATED_PATCH | Freq: Every day | TRANSDERMAL | Status: DC
  Administered 2021-11-20 – 2021-11-22 (×3): 21 mg via TRANSDERMAL
  Filled 2021-11-20 (×3): qty 1

## 2021-11-20 MED ORDER — SODIUM CHLORIDE 0.9 % IV SOLN: INTRAVENOUS | Status: DC

## 2021-11-20 MED ORDER — ASPIRIN EC 81 MG PO TBEC
81.0000 mg | DELAYED_RELEASE_TABLET | Freq: Every day | ORAL | Status: DC
  Administered 2021-11-20 – 2021-11-22 (×3): 81 mg via ORAL
  Filled 2021-11-20 (×3): qty 1

## 2021-11-20 MED ORDER — LORAZEPAM 2 MG/ML IJ SOLN: 1.0000 mg | INTRAMUSCULAR | Status: DC | PRN

## 2021-11-20 MED ORDER — ASPIRIN 325 MG PO TABS: 325.0000 mg | ORAL_TABLET | Freq: Every day | ORAL | Status: DC

## 2021-11-20 MED ORDER — CLOPIDOGREL BISULFATE 75 MG PO TABS
75.0000 mg | ORAL_TABLET | Freq: Every day | ORAL | Status: DC
  Administered 2021-11-21 – 2021-11-22 (×2): 75 mg via ORAL
  Filled 2021-11-20 (×2): qty 1

## 2021-11-20 MED ORDER — PNEUMOCOCCAL 20-VAL CONJ VACC 0.5 ML IM SUSY
0.5000 mL | PREFILLED_SYRINGE | INTRAMUSCULAR | Status: DC
  Filled 2021-11-20: qty 0.5

## 2021-11-20 MED ORDER — FOLIC ACID 1 MG PO TABS
1.0000 mg | ORAL_TABLET | Freq: Every day | ORAL | Status: DC
  Administered 2021-11-21 – 2021-11-22 (×2): 1 mg via ORAL
  Filled 2021-11-20 (×2): qty 1

## 2021-11-20 NOTE — Assessment & Plan Note (Signed)
-   Spoke about importance of quitting spent 5 minutes discussing options for treatment, prior attempts at quitting, and dangers of smoking ? -At this point patient is    interested in quitting ? - order nicotine patch  ? - nursing tobacco cessation protocol ? ?

## 2021-11-20 NOTE — Subjective & Objective (Signed)
Headache , blurred vision, tingling 3 days ?Has been drinking heavily for the past few days ?3 days ago he started to have double vision, and felt like the right side of his body was "numb." ?He slept but when he woke up his symptoms persisted ?No prior CVA ?He drinks on regula basis ? ?

## 2021-11-20 NOTE — Assessment & Plan Note (Signed)
Monitor for ETOH withdrawal ? order CIWA protcol ?

## 2021-11-20 NOTE — Assessment & Plan Note (Signed)
Allow permissive HTN for tonight an ?

## 2021-11-20 NOTE — Consult Note (Addendum)
Neurology Consultation ?Reason for Consult: Stroke ?Referring Physician: Rodena Medin, P ? ?CC: Right-sided numbness ? ?History is obtained from: Patient ? ?HPI: Alex [name] is a 51 y.o. male with symptoms since Thursday.  He states that he felt a "pop" in his head and then noticed that he was numb and lightheaded.  He began experiencing double vision.  The symptoms have persisted therefore he presented to the emergency department today. ? ? ?LKW: Thursday ?tpa given?: no, outside of window ? ? ?ROS: A 14 point ROS was performed and is negative except as noted in the HPI.  ? ?Past Medical History:  ?Diagnosis Date  ? HTN (hypertension)   ? ? ? ?Family History  ?Problem Relation Age of Onset  ? Diabetes Mother   ? Heart disease Mother   ? Heart disease Father   ? Hypertension Father   ? ? ? ?Social History:  reports that he has been smoking cigarettes. He has a 40.00 pack-year smoking history. He has never used smokeless tobacco. He reports current alcohol use of about 6.0 - 12.0 standard drinks per week. He reports current drug use. Drugs: Methamphetamines and Marijuana. ? ? ?Exam: ?Current vital signs: ?BP (!) 195/115 (BP Location: Left Arm)   Pulse 87   Temp 97.8 ?F (36.6 ?C) (Oral)   Resp 18   SpO2 94%  ?Vital signs in last 24 hours: ?Temp:  [97.8 ?F (36.6 ?C)] 97.8 ?F (36.6 ?C) (03/19 1439) ?Pulse Rate:  [87-122] 87 (03/19 1819) ?Resp:  [16-27] 18 (03/19 1819) ?BP: (159-226)/(98-149) 195/115 (03/19 1819) ?SpO2:  [94 %-99 %] 94 % (03/19 1819) ? ? ?Physical Exam  ?Constitutional: Appears well-developed and well-nourished.  ?Psych: Affect appropriate to situation ?Eyes: No scleral injection ?HENT: No OP obstruction ?MSK: no joint deformities.  ?Cardiovascular: Normal rate and regular rhythm.  ?Respiratory: Effort normal, non-labored breathing ?GI: Soft.  No distension. There is no tenderness.  ?Skin: WDI ? ?Neuro: ?Mental Status: ?Patient is awake, alert, oriented to person, place, month, year, and  situation. ?Patient is able to give a clear and coherent history. ?No signs of aphasia or neglect ?Cranial Nerves: ?II: Visual Fields are full. Pupils are equal, round, and reactive to light.   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  ?Motor: ?He has no clear weakness, but significant ataxia in the left ?Sensory: ?Sensation is diminished to temperature throughout the right side ?Cerebellar: ?He is ataxic in the left upper extremity ? ? ? ? ?I have reviewed labs in epic and the results pertinent to this consultation are: ?Creatinine 0.84 ? ?I have reviewed the images obtained: CT head-negative ? 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  This would localize to the pons affecting the pontine gray, intra-axial fibers of the 6th and 7th cranial nerve, and spinothalamic tract.  He will need admission for secondary risk factor modification and therapy. ? ?Recommendations: ?-Admit to [facility] for stroke work-up ?- HgbA1c, fasting lipid panel ?- MRI of the brain without contrast ?- Frequent neuro checks ?- Echocardiogram ?- CTA head and neck ?- Prophylactic therapy-Antiplatelet med: Aspirin - dose 81mg  and plavix 75mg  daily  after 300mg  load  ?- Risk factor modification ?- Telemetry monitoring ?-  PT consult, OT consult, Speech consult ?- Stroke team to follow ? ? ? ?Ritta Slot, MD ?[facility] ?[phone] ? ?If 7pm- 7am, please page neurology on call as listed in AMION. ? ?

## 2021-11-20 NOTE — ED Triage Notes (Addendum)
Patient reports headache, blurred vision, and tingling to right side x3 days. Hypertensive in triage. ?

## 2021-11-20 NOTE — ED Provider Notes (Signed)
?[facility]-EMERGENCY DEPT ?Provider Note ? ? ?CSN: [id number] ?Arrival date & time: 11/20/21  1432 ? ?  ? ?History ? ?Chief Complaint  ?Patient presents with  ? Tingling  ? Blurred Vision  ? ? ?Alex [name] is a 51 y.o. male. ? ?51 year old male with prior medical history as detailed below presents for evaluation.  Patient reports that Alex [name] woke up with symptoms on this past Thursday morning.  For the several days prior to Thursday Alex [name] had been celebrating his birthday and had been drinking heavily every day. ? ?On Thursday morning the patient felt dizzy, experienced double vision, and felt like the right side of his body was "numb." ? ?The patient is double vision, dizziness, and right arm and right leg numbness have persisted since Thursday morning. ? ?Alex [name] denies prior history of stroke.  Alex [name] reports prior history of hypertension.  Alex [name] does not take any medications for same.  Alex [name] reports regular alcohol use.  His last alcohol intake was on Wednesday.  Alex [name] denies any alcohol consumption since. ? ?The history is provided by the patient and medical records.  ?Illness ?Location:  Dizziness, double vision, numbness to the right arm and right leg. ?Severity:  Moderate ?Onset quality:  Unable to specify ?Duration:  4 days ?Timing:  Constant ?Progression:  Unchanged ?Chronicity:  New ? ?  ? ?Home Medications ?Prior to Admission medications   ?Medication Sig Start Date End Date Taking? Authorizing Provider  ?amLODipine (NORVASC) 10 MG tablet Take 1 tablet (10 mg total) by mouth daily. 01/05/21   Azucena Fallen, MD  ?hydrALAZINE (APRESOLINE) 25 MG tablet Take 1 tablet (25 mg total) by mouth 3 (three) times daily. 01/05/21 02/04/21  Azucena Fallen, MD  ?hydrochlorothiazide (HYDRODIURIL) 25 MG tablet Take 2 tablets (50 mg total) by mouth daily. 01/05/21 02/04/21  Azucena Fallen, MD  ?lisinopril (ZESTRIL) 20 MG tablet Take 2 tablets (40 mg total) by mouth daily. 01/05/21 02/04/21  Azucena Fallen, MD   ?nicotine (NICODERM CQ - DOSED IN MG/24 HOURS) 21 mg/24hr patch Place 1 patch (21 mg total) onto the skin daily. 01/05/21   Azucena Fallen, MD  ?   ? ?Allergies    ?Patient has no known allergies.   ? ?Review of Systems   ?Review of Systems  ?All other systems reviewed and are negative. ? ?Physical Exam ?Updated Vital Signs ?BP (!) 222/123   Pulse 97   Temp 97.8 ?F (36.6 ?C) (Oral)   Resp 20   SpO2 96%  ?Physical Exam ?Vitals and nursing note reviewed.  ?Constitutional:   ?   General: Alex [name] is not in acute distress. ?   Appearance: Normal appearance. Alex [name] is well-developed.  ?HENT:  ?   Head: Normocephalic and atraumatic.  ?Eyes:  ?   Conjunctiva/sclera: Conjunctivae normal.  ?Cardiovascular:  ?   Rate and Rhythm: Normal rate and regular rhythm.  ?   Heart sounds: Normal heart sounds.  ?Pulmonary:  ?   Effort: Pulmonary effort is normal. No respiratory distress.  ?   Breath sounds: Normal breath sounds.  ?Abdominal:  ?   General: There is no distension.  ?   Palpations: Abdomen is soft.  ?   Tenderness: There is no abdominal tenderness.  ?Musculoskeletal:     ?   General: No deformity. Normal range of motion.  ?   Cervical back: Normal range of motion and neck supple.  ?Skin: ?   General: Skin is warm and dry.  ?Neurological:  ?  Mental Status: Alex [name] is alert and oriented to person, place, and time.  ?   Comments: Mild left facial droop ? ?Normal speech ? 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 ?LVO screen negative ? ?Reports decreased sensation of pain/temperature to RUE/RLE  ? ? ?ED Results / Procedures / Treatments   ?Labs ?(all labs ordered are listed, but only abnormal results are displayed) ?Labs Reviewed  ?I-STAT CHEM 8, ED - Abnormal; Notable for the following components:  ?    Result Value  ? Glucose, Bld 148 (*)   ? All other components within normal limits  ?RESP PANEL BY RT-PCR (FLU A&B, COVID) ARPGX2  ?CBC WITH DIFFERENTIAL/PLATELET  ?COMPREHENSIVE  METABOLIC PANEL  ?URINALYSIS, ROUTINE W REFLEX MICROSCOPIC  ?RAPID URINE DRUG SCREEN, HOSP PERFORMED  ?ETHANOL  ?PROTIME-INR  ?APTT  ? ? ?EKG ?EKG Interpretation ? ?Date/Time:  Sunday November 20 2021 14:50:49 EDT ?Ventricular Rate:  92 ?PR Interval:  123 ?QRS Duration: 106 ?QT Interval:  360 ?QTC Calculation: 446 ?R Axis:   48 ?Text Interpretation: Sinus rhythm Probable left atrial enlargement Baseline wander in lead(s) V3 Confirmed by Kristine RoyalMessick, Orah Sonnen [phone]([id number]) on 11/20/2021 2:54:37 PM ? ?Radiology ?CT Head Wo Contrast ? ?Result Date: 11/20/2021 ?CLINICAL DATA:  Transient ischemic attack. EXAM: CT HEAD WITHOUT CONTRAST TECHNIQUE: Contiguous axial images were obtained from the base of the skull through the vertex without intravenous contrast. RADIATION DOSE REDUCTION: This exam was performed according to the departmental dose-optimization program which includes automated exposure control, adjustment of the mA and/or kV according to patient size and/or use of iterative reconstruction technique. COMPARISON:  Head CT and brain MRI 01/01/2021 FINDINGS: Brain: No intracranial hemorrhage, mass effect, or midline shift. No hydrocephalus. The basilar cisterns are patent. No evidence of territorial infarct or acute ischemia. No extra-axial or intracranial fluid collection. Vascular: Atherosclerosis of skullbase vasculature without hyperdense vessel or abnormal calcification. Skull: No fracture or focal lesion. Sinuses/Orbits: Mucosal thickening of ethmoid air cells. Mastoid air cells are clear. Unremarkable orbits. Other: Midline frontal scalp lipoma.  No internal complexity. IMPRESSION: No acute intracranial abnormality. Electronically Signed   By: Narda RutherfordMelanie  Sanford M.D.   On: 11/20/2021 15:22   ? ?Procedures ?Procedures  ? ? ?Medications Ordered in ED ?Medications - No data to display ? ?ED Course/ Medical Decision Making/ A&P ?Clinical Course as of 11/20/21 1536  ?Wynelle LinkSun Nov 20, 2021  ?1513 CT Head Wo Contrast [PM]  ?  ?Clinical  Course User Index ?[PM] Wynetta FinesMessick, [place] Searles C, MD  ? ?                        ?Medical Decision Making ?Amount and/or Complexity of Data Reviewed ?Radiology: ordered. Decision-making details documented in ED Course. ? ? ? ?Medical Screen Complete ? ?This patient presented to the ED with complaint of dizziness, weakness, double vision, numbness to the right arm and leg. ? ?This complaint involves an extensive number of treatment options. The initial differential diagnosis includes, but is not limited to, acute stroke, hypertensive urgency, etc. ? ?This presentation is: Acute, Chronic, Self-Limited, Previously Undiagnosed, Uncertain Prognosis, Complicated, Systemic Symptoms, and Threat to Life/Bodily Function ? ?Patient with longstanding history of uncontrolled hypertension and self-reported excessive alcohol consumption. ? ?Patient with new symptoms x 3 days. ? ?Patient is complaining of double vision, dizziness, decreased sensation of pain and temperature in the right arm and leg. ? ?Patient is outside the window for acute neurologic intervention. ? ?  Patient seen in the ED by Dr. Amada Jupiter with Neuro.  Dr. Amada Jupiter request ED evaluation and work-up.  Patient will require admission to [facility] for treatment of presumed sub-acute CVA.  Dr. Amada Jupiter request official neuro consult upon arrival to [facility] ? ?Hospitalist service aware of case and will evaluate for admission. ? ? ?Co morbidities that complicated the patient's evaluation ? ?Uncontrolled Hypertension, Excessive ETOH Consumption ? ? ?Additional history obtained: ? ?External records from outside sources obtained and reviewed including prior ED visits and prior Inpatient records.  ? ? ?Lab Tests: ? ?I ordered and personally interpreted labs.  The pertinent results include: CBC, CMP, coags, EtOH, COVID, flu, urine tox ? ? ?Imaging Studies ordered: ? ?I ordered imaging studies including CT head, MRI Brain  ?I independently visualized and interpreted  obtained imaging which showed Acute stroke in pons ?I agree with the radiologist interpretation. ? ? ?Cardiac Monitoring: ? ?The patient was maintained on a cardiac monitor.  I personally viewed and interpreted the

## 2021-11-20 NOTE — Assessment & Plan Note (Signed)
-   will admit based on  CVA protocol       Monitor on Tele ?       MRI   Resulted - showing acute ischemic CVA  ?      CTA ordered ?      Echo to evaluate for possible embolic source,  ?      obtain cardiac enzymes,  ECG,   Lipid panel, TSH.  ?      Order PT/OT evaluation.  ?       passed swallow eval  ?      Will make sure patient is on antiplatelet ASA 81 Plavix agent and statin if needed ?      Allow permissive Hypertension keep BP <220/120  ?      Neurology consulted Have seen pt in ER  ? ?

## 2021-11-20 NOTE — H&P (Signed)
? ? ?Alex [name] KGM:[id number] DOB: [birth year] DOA: 11/20/2021 ?  ?  ?[facility]: [facility]   ?Outpatient Specialists:  NONE ?  ? ?Patient arrived to ER on 11/20/21 at 1432 ?Referred by Attending Wynetta Fines, MD ?  ? ?Patient coming from:   ? home Lives  With  roomate ?   ?Chief Complaint:   ?Chief Complaint  ?Patient presents with  ? Tingling  ? Blurred Vision  ? ? ?HPI: ?Alex [name] is a 51 y.o. male with medical history significant of HTN, alcohol abuse, tobacco abuse, diastolic CHF ?  ? ?Presented with  right side numbness ?Headache , blurred vision, tingling 3 days ?Has been drinking heavily for the past few days ?3 days ago he started to have double vision, and felt like the right side of his body was "numb." ?He slept but when he woke up his symptoms persisted ?No prior CVA ?He drinks on regula basis ? ? Says he has had the shakes in the past ?He now only drinks weekends ?Can have up to 12 beers a day ? ? Initial COVID TEST  ?NEGATIVE  ? ?Lab Results  ?Component Value Date  ? SARSCOV2NAA NEGATIVE 11/20/2021  ? SARSCOV2NAA NEGATIVE 01/01/2021  ? ?  ?Regarding pertinent Chronic problems:   ?  ? HTN on norvasc ? ? chronic CHF diastolic/ - last echo EF 60-65% ?Grade II diastolic dysfunction  ?  ?  COPD - not  followed by pulmonology   not  on baseline oxygen   ?  ?While in ER: ?Clinical Course as of 11/20/21 1849  ?Wynelle Link Nov 20, 2021  ?1513 CT Head Wo Contrast [PM]  ?  ?Clinical Course User Index ?[PM] Wynetta Fines, MD  ?  ?Ordered ? ?CT HEAD   NON acute ? ?CXR - NON acute ? ?  MRI  - 1. Small acute infarct in the posterior and inferior left pons. ?Associated edema without mass effect. ?2. Mild chronic microvascular disease. ? ?Following Medications were ordered in ER: ?Medications - No data to display  ?_______________________________________________________ ?ER Provider Called:   Neurology   Dr.KIRKPATRICK ?They Recommend admit to medicine   ?Will see in AM   or on arrival to [facility] ?  ?ED Triage  Vitals [11/20/21 1439]  ?Enc Vitals Group  ?   BP (!) 226/126  ?   Pulse Rate (!) 122  ?   Resp 18  ?   Temp 97.8 ?F (36.6 ?C)  ?   Temp Source Oral  ?   SpO2 99 %  ?   Weight   ?   Height   ?   Head Circumference   ?   Peak Flow   ?   Pain Score   ?   Pain Loc   ?   Pain Edu?   ?   Excl. in GC?   ?UYQI(34)@    ? _________________________________________ ?Significant initial  Findings: ?Abnormal Labs Reviewed  ?COMPREHENSIVE METABOLIC PANEL - Abnormal; Notable for the following components:  ?    Result Value  ? Glucose, Bld 144 (*)   ? Calcium 8.6 (*)   ? All other components within normal limits  ?I-STAT CHEM 8, ED - Abnormal; Notable for the following components:  ? Glucose, Bld 148 (*)   ? All other components within normal limits  ?  ?ECG: Ordered ?Personally reviewed by me showing: ?HR : 92 ?Rhythm:  NSR,   ? no evidence of ischemic changes ?[id number] ? ?  ?  The recent clinical data is shown below. ?Vitals:  ? 11/20/21 1530 11/20/21 1630 11/20/21 1645 11/20/21 1819  ?BP: (!) 194/103 (!) 166/98 (!) 159/99 (!) 195/115  ?Pulse: 100 93 92 87  ?Resp: (!) 27  18 18   ?Temp:      ?TempSrc:      ?SpO2: 95% 96% 96% 94%  ? ?  ?WBC ? ?   ?Component Value Date/Time  ? WBC 10.3 11/20/2021 1500  ? LYMPHSABS 3.0 11/20/2021 1500  ? MONOABS 0.8 11/20/2021 1500  ? EOSABS 0.1 11/20/2021 1500  ? BASOSABS 0.1 11/20/2021 1500  ?  ? ? UA   no evidence of UTI   ?  ?Urine analysis: ?   ?Component Value Date/Time  ? COLORURINE YELLOW 11/20/2021 1610  ? APPEARANCEUR CLEAR 11/20/2021 1610  ? LABSPEC 1.015 11/20/2021 1610  ? PHURINE 6.0 11/20/2021 1610  ? GLUCOSEU NEGATIVE 11/20/2021 1610  ? HGBUR NEGATIVE 11/20/2021 1610  ? BILIRUBINUR NEGATIVE 11/20/2021 1610  ? KETONESUR NEGATIVE 11/20/2021 1610  ? PROTEINUR NEGATIVE 11/20/2021 1610  ? NITRITE NEGATIVE 11/20/2021 1610  ? LEUKOCYTESUR NEGATIVE 11/20/2021 1610  ? ? ?Results for orders placed or performed during the hospital encounter of 11/20/21  ?Resp Panel by RT-PCR (Flu A&B, Covid)      Status: None  ? Collection Time: 11/20/21  4:27 PM  ? Specimen: Nasopharyngeal(NP) swabs in vial transport medium  ?Result Value Ref Range Status  ? SARS Coronavirus 2 by RT PCR NEGATIVE NEGATIVE Final  ?      ? Influenza A by PCR NEGATIVE NEGATIVE Final  ? Influenza B by PCR NEGATIVE NEGATIVE Final  ?      ? ?  ?_______________________________________________ ?Hospitalist was called for admission for CVA ? ?The following Work up has been ordered so far: ? ?Orders Placed This Encounter  ?Procedures  ? Resp Panel by RT-PCR (Flu A&B, Covid) Nasopharyngeal Swab  ? CT Head Wo Contrast  ? MR BRAIN WO CONTRAST  ? CBC with Differential  ? Comprehensive metabolic panel  ? Urinalysis, Routine w reflex microscopic  ? Rapid urine drug screen (hospital performed)  ? Ethanol  ? Protime-INR  ? APTT  ? Diet NPO time specified  ? Vital signs  ? Cardiac Monitoring  ? Modified Stroke Scale (mNIHSS) Document mNIHSS assessment every 2 hours for a total of 12 hours  ? Stroke swallow screen  ? Initiate Carrier Fluid Protocol  ? If O2 sat If O2 Sat < 94%, administer O2 at 2 liters/minute via nasal cannula.  ? Consult to hospitalist  Acute cva  ? Pulse oximetry, continuous  ? I-stat chem 8, ED  ? ED EKG  ? Saline lock IV  ?  ? ?OTHER Significant initial  Findings: ? ?labs showing:  ?Recent Labs  ?Lab 11/20/21 ?1500 11/20/21 ?1527  ?NA 135 138  ?K 3.6 3.5  ?CO2 23  --   ?GLUCOSE 144* 148*  ?BUN 9 7  ?CREATININE 0.84 0.80  ?CALCIUM 8.6*  --   ?  ?Cr  stable,    ?Lab Results  ?Component Value Date  ? CREATININE 0.80 11/20/2021  ? CREATININE 0.84 11/20/2021  ? CREATININE 1.09 01/05/2021  ? ? ?Recent Labs  ?Lab 11/20/21 ?1500  ?AST 34  ?ALT 44  ?ALKPHOS 73  ?BILITOT 0.5  ?PROT 7.3  ?ALBUMIN 3.7  ? ?Lab Results  ?Component Value Date  ? CALCIUM 8.6 (L) 11/20/2021  ? ?     ?Plt: ?Lab Results  ?Component Value Date  ?  PLT 198 11/20/2021  ?  ?COVID-19 Labs ? ?No results for input(s): DDIMER, FERRITIN, LDH, CRP in the last 72 hours. ? ?Lab Results   ?Component Value Date  ? SARSCOV2NAA NEGATIVE 11/20/2021  ? SARSCOV2NAA NEGATIVE 01/01/2021  ? ?  ?   ?Recent Labs  ?Lab 11/20/21 ?1500 11/20/21 ?1527  ?WBC 10.3  --   ?NEUTROABS 6.4  --   ?HGB 15.4 14.6  ?HCT 44.6 43.0  ?MCV 87.3  --   ?PLT 198  --   ? ? ?HG/HCT stable,  ?   ?Component Value Date/Time  ? HGB 14.6 11/20/2021 1527  ? HCT 43.0 11/20/2021 1527  ? MCV 87.3 11/20/2021 1500  ?  ? ?Cardiac Panel (last 3 results) ?No results for input(s): CKTOTAL, CKMB, TROPONINI, RELINDX in the last 72 hours. ? .car ?BNP (last 3 results) ?No results for input(s): BNP in the last 8760 hours. ?  ? ?DM  labs:  ?HbA1C: ?Recent Labs  ?  01/03/21 ?[id number]  ?HGBA1C 5.8*  ? ?  ?  ?CBG (last 3)  ?No results for input(s): GLUCAP in the last 72 hours. ?  ? ?    ?Cultures: ?No results found for: SDES, SPECREQUEST, CULT, REPTSTATUS ?  ?Radiological Exams on Admission: ?CT Head Wo Contrast ? ?Result Date: 11/20/2021 ?CLINICAL DATA:  Transient ischemic attack. EXAM: CT HEAD WITHOUT CONTRAST TECHNIQUE: Contiguous axial images were obtained from the base of the skull through the vertex without intravenous contrast. RADIATION DOSE REDUCTION: This exam was performed according to the departmental dose-optimization program which includes automated exposure control, adjustment of the mA and/or kV according to patient size and/or use of iterative reconstruction technique. COMPARISON:  Head CT and brain MRI 01/01/2021 FINDINGS: Brain: No intracranial hemorrhage, mass effect, or midline shift. No hydrocephalus. The basilar cisterns are patent. No evidence of territorial infarct or acute ischemia. No extra-axial or intracranial fluid collection. Vascular: Atherosclerosis of skullbase vasculature without hyperdense vessel or abnormal calcification. Skull: No fracture or focal lesion. Sinuses/Orbits: Mucosal thickening of ethmoid air cells. Mastoid air cells are clear. Unremarkable orbits. Other: Midline frontal scalp lipoma.  No internal complexity.  IMPRESSION: No acute intracranial abnormality. Electronically Signed   By: Narda RutherfordMelanie  Sanford M.D.   On: 11/20/2021 15:22  ? ?MR BRAIN WO CONTRAST ? ?Result Date: 11/20/2021 ?CLINICAL DATA:  Neuro deficit, acut

## 2021-11-21 ENCOUNTER — Inpatient Hospital Stay (HOSPITAL_COMMUNITY): Payer: No Typology Code available for payment source

## 2021-11-21 DIAGNOSIS — I6302 Cerebral infarction due to thrombosis of basilar artery: Secondary | ICD-10-CM | POA: Diagnosis not present

## 2021-11-21 DIAGNOSIS — Z720 Tobacco use: Secondary | ICD-10-CM | POA: Diagnosis not present

## 2021-11-21 DIAGNOSIS — I6389 Other cerebral infarction: Secondary | ICD-10-CM

## 2021-11-21 DIAGNOSIS — Z789 Other specified health status: Secondary | ICD-10-CM | POA: Diagnosis not present

## 2021-11-21 LAB — ECHOCARDIOGRAM COMPLETE
AR max vel: 3.74 cm2
AV Area VTI: 3.68 cm2
AV Area mean vel: 3.2 cm2
AV Mean grad: 4 mmHg
AV Peak grad: 7.2 mmHg
Ao pk vel: 1.34 m/s
Area-P 1/2: 2.96 cm2
S' Lateral: 2.3 cm

## 2021-11-21 LAB — LIPID PANEL
Cholesterol: 164 mg/dL (ref 0–200)
HDL: 47 mg/dL (ref >40)
LDL Cholesterol: 82 mg/dL (ref 0–99)
Total CHOL/HDL Ratio: 3.5 RATIO
Triglycerides: 176 mg/dL — ABNORMAL HIGH (ref <150)
VLDL: 35 mg/dL (ref 0–40)

## 2021-11-21 LAB — HEMOGLOBIN A1C
Hgb A1c MFr Bld: 5.7 % — ABNORMAL HIGH (ref 4.8–5.6)
Mean Plasma Glucose: 116.89 mg/dL

## 2021-11-21 MED ORDER — ROSUVASTATIN CALCIUM 5 MG PO TABS
10.0000 mg | ORAL_TABLET | Freq: Every day | ORAL | Status: DC
Start: 2021-11-21 — End: 2021-11-22
  Administered 2021-11-21 – 2021-11-22 (×2): 10 mg via ORAL
  Filled 2021-11-21 (×2): qty 2

## 2021-11-21 MED ORDER — LABETALOL HCL 5 MG/ML IV SOLN: 5.0000 mg | INTRAVENOUS | Status: DC | PRN

## 2021-11-21 NOTE — Progress Notes (Signed)
?  Transition of Care (TOC) Screening Note ? ? ?Patient Details  ?Name: Alex [name] ?Date of Birth: Feb 21, 1971 ? ? ?Transition of Care (TOC) CM/SW Contact:    ?Cyndi Bender, RN ?Phone Number: ?11/21/2021, 8:57 AM ? ? ? ?Transition of Care Department [facility]) has reviewed patient. We will continue to monitor patient advancement through interdisciplinary progression rounds. Patient needs substance abuse counseling. [facility] notified [facility].  [phone] ?

## 2021-11-21 NOTE — Evaluation (Signed)
Occupational Therapy Evaluation ?Patient Details ?Name: Alex [name] ?MRN: [id number] ?DOB: 11-Oct-1970 ?Today's Date: 11/21/2021 ? ? ?History of Present Illness This 51 yo male presented with double vision, dizziness, and right arm and right leg numbness have persisted since Thursday morning. MRI: Small acute infarct in the posterior and inferior left pons. Associated edema without mass effect.PHMx:ETOH, occassional illicit drug use, tobacco, COPD, HTN.  ? ?Clinical Impression ?  ?This 51 yo male admitted with above presents to acute OT with main deficits double vision and decreased sensation right side of his body. PTA he was totally independent with basic ADLs, IADL, and working. He currently is at an overall S level with his glasses taped to compensate for double vision. He will continue to benefit from acute OT with follow up at OPOT. ?   ? ?Recommendations for follow up therapy are one component of a multi-disciplinary discharge planning process, led by the attending physician.  Recommendations may be updated based on patient status, additional functional criteria and insurance authorization.  ? ?Follow Up Recommendations ? Outpatient OT  ?  ?Assistance Recommended at Discharge PRN  ?Patient can return home with the following Assist for transportation ? ?  ?Functional Status Assessment ? Patient has had a recent decline in their functional status and demonstrates the ability to make significant improvements in function in a reasonable and predictable amount of time.  ?Equipment Recommendations ? None recommended by OT  ?  ?   ?Precautions / Restrictions Precautions ?Precautions: Fall ?Restrictions ?Weight Bearing Restrictions: No  ? ?  ? ?Mobility Bed Mobility ?Overal bed mobility: Independent ?  ?  ?  ?  ?  ?  ?  ?  ? ?Transfers ?Overall transfer level: Needs assistance ?  ?  ?  ?  ?  ?  ?  ?  ?General transfer comment: overall at a S level due to vision ?  ? ?  ?Balance Overall balance assessment: Mild  deficits observed, not formally tested ?  ?  ?  ?  ?  ?  ?  ?  ?  ?  ?  ?  ?  ?  ?  ?  ?  ?  ?   ? ?ADL either performed or assessed with clinical judgement  ? ?ADL Overall ADL's :  (overall at a S level) ?  ?  ?  ?  ?  ?  ?  ?  ?  ?  ?  ?  ?  ?  ?  ?  ?  ?  ?  ?   ? ? ? ?Vision Baseline Vision/History: 1 Wears glasses ?Ability to See in Adequate Light: 0 Adequate ?Patient Visual Report: Diplopia;Blurring of vision ?Additional Comments: Pt reports blurriness to far right and as he moves his eyes more left the images become 2 distinct images further apart. With taping of right side of glasses nasal portion he has a little bit of blurry vision where tape stops/starts but no double vision. Encouraged pt to use his glasses anytime he is up on his feet and any time he is really using his vision (ie: reading or watching tv)  ?   ?   ?   ? ?Pertinent Vitals/Pain Pain Assessment ?Pain Assessment: No/denies pain  ? ? ? ?Hand Dominance Right ?  ?Extremity/Trunk Assessment Upper Extremity Assessment ?Upper Extremity Assessment: RUE deficits/detail ?RUE Sensation: decreased light touch (decreased sensation of temperature) ?  ?  ?  ?  ?  ?Communication Communication ?Communication: No difficulties ?  ?  Cognition Arousal/Alertness: Awake/alert ?Behavior During Therapy: Alex [name] for tasks assessed/performed ?Overall Cognitive Status: Within Functional Limits for tasks assessed ?  ?  ?  ?  ?  ?  ?  ?  ?  ?  ?  ?  ?  ?  ?  ?  ?  ?  ?  ?   ?   ?   ? ? ?Home Living Family/patient expects to be discharged to:: Private residence ?Living Arrangements: Non-relatives/Friends ?Available Help at Discharge:  (roommate--works) ?Type of Home: House ?Home Access: Stairs to enter ?Entrance Stairs-Number of Steps: 4 ?Entrance Stairs-Rails: Right (going up) ?Home Layout: One level ?  ?  ?Bathroom Shower/Tub: Tub/shower unit ?  ?Bathroom Toilet: Standard ?  ?  ?Home Equipment: None ?  ?  ? Lives With: Other (Comment) (roommate) ? ?  ?Prior  Functioning/Environment Prior Level of Function : Independent/Modified Independent ?  ?  ?  ?  ?  ?  ?Mobility Comments: does not drive ?  ?  ? ?  ?  ?OT Problem List: Impaired balance (sitting and/or standing);Impaired vision/perception ?  ?   ?OT Treatment/Interventions: Visual/perceptual remediation/compensation;Patient/family education  ?  ?OT Goals(Current goals can be found in the care plan section) Acute Rehab OT Goals ?Patient Stated Goal: to be able to go back to work ?OT Goal Formulation: With patient ?Time For Goal Achievement: 12/05/21 ?Potential to Achieve Goals: Good  ?OT Frequency: Min 2X/week ?  ? ?   ?AM-PAC OT "6 Clicks" Daily Activity     ?Outcome Measure Help from another person eating meals?: None ?Help from another person taking care of personal grooming?: A Little ?Help from another person toileting, which includes using toliet, bedpan, or urinal?: A Little ?Help from another person bathing (including washing, rinsing, drying)?: A Little ?Help from another person to put on and taking off regular upper body clothing?: A Little ?Help from another person to put on and taking off regular lower body clothing?: A Little ?6 Click Score: 19 ?  ?End of Session Equipment Utilized During Treatment: Gait belt ? ?Activity Tolerance: Patient tolerated treatment well ?Patient left: in chair;with call bell/phone within reach;with chair alarm set ? ?OT Visit Diagnosis: Unsteadiness on feet (R26.81);Low vision, both eyes (H54.2)  ?              ?Time: 1030-1049 ?OT Time Calculation (min): 19 min ?Charges:  OT General Charges ?$OT Visit: 1 Visit ?OT Evaluation ?$OT Eval Moderate Complexity: 1 Mod ? ? ?Alex [name], OTR/L ?Acute Rehab Services ?Pager [phone] ?Office [phone] ? ? ?Alex [name] ?11/21/2021, 12:01 PM ?

## 2021-11-21 NOTE — Evaluation (Signed)
Speech Language Pathology Evaluation ?Patient Details ?Name: Fritz Cauthon ?MRN: [id number] ?DOB: 1971/04/19 ?Today's Date: 11/21/2021 ?Time: 3419-3790 ?SLP Time Calculation (min) (ACUTE ONLY): 21 min ? ?Problem List:  ?Patient Active Problem List  ? Diagnosis Date Noted  ? CVA (cerebral vascular accident) (HCC) 11/20/2021  ? Hypertensive urgency 01/01/2021  ? Tobacco use 01/01/2021  ? Alcohol use 01/01/2021  ? Substance use 01/01/2021  ? Vertigo 01/01/2021  ? 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 ?Past Medical History:  ?Past Medical History:  ?Diagnosis Date  ? HTN (hypertension)   ? ?Past Surgical History: History reviewed. No pertinent surgical history. ?HPI:  ?Ansen Sayegh is a 51 y.o. male who presented with right side numbness, headache, blurred vision, tingling 3 days.  MRI 3/19: "Small acute infarct in the posterior and inferior left pons."  Pt with medical history significant of HTN, alcohol abuse, tobacco abuse, diastolic CHF.  ? ?Assessment / Plan / Recommendation ?Clinical Impression ? Pt presents with functional cognitive linguistic ability.  Pt was assessed using the COGNISTAT (see below for additional information).  Pt performed within the average range on all subtests administered except for memory.  Memory subtest indicated a borderline impairment did not not reach the level of mild deficits.  Pt reports that this performance is consistent with his baseline.  Pt also reports independent use of compensatory strategies (especially writing things down) as this has been a lifelong problem for him.  Pt participated in high level conversation regarding his recovery and challenges of smoking cessation without any difficulty.  Pt's speech is clear and free from dysarthria.   ? ?Pt has no further ST needs; SLP will sign off. ?  ?COGNISTAT: All subtests are within the average range, except where otherwise specified. ? ?Orientation:  12/12 ?Attention: 8/8 ?Comprehension: 6/6 ?Repetition: 12/12 ?Naming:  8/8 ?Construction: not assessed ?Memory: 9/12, borderline impairment ?Calculations: 4/4 ?Similarities: 8/8 ?Judgment: 6/6 ? ?  ?SLP Assessment ? SLP Recommendation/Assessment: Patient does not need any further Speech Lanaguage Pathology Services ?SLP Visit Diagnosis: Cognitive communication deficit (R41.841)  ?  ?Recommendations for follow up therapy are one component of a multi-disciplinary discharge planning process, led by the attending physician.  Recommendations may be updated based on patient status, additional functional criteria and insurance authorization. ?   ?Follow Up Recommendations ? No SLP follow up  ?  ?Assistance Recommended at Discharge ? None  ?Functional Status Assessment Patient has not had a recent decline in their functional status  ?Frequency and Duration   N/A ?  ?  ?   ?SLP Evaluation ?Cognition ? Overall Cognitive Status: Within Functional Limits for tasks assessed ?Orientation Level: Oriented X4 ?Attention: Focused;Sustained ?Focused Attention: Appears intact ?Sustained Attention: Appears intact ?Memory: Appears intact ?Executive Function: Reasoning ?Reasoning: Appears intact ?Safety/Judgment: Appears intact  ?  ?   ?Comprehension ? Auditory Comprehension ?Overall Auditory Comprehension: Appears within functional limits for tasks assessed ?Commands: Within Functional Limits ?Conversation: Complex  ?  ?Expression Expression ?Primary Mode of Expression: Verbal ?Verbal Expression ?Initiation: No impairment ?Repetition: No impairment ?Naming: No impairment ?Pragmatics: No impairment ?Written Expression ?Dominant Hand: Right ?Written Expression: Not tested   ?Oral / Motor ? Motor Speech ?Overall Motor Speech: Appears within functional limits for tasks assessed ?Respiration: Within functional limits ?Resonance: Within functional limits ?Articulation: Within functional limitis ?Intelligibility: Intelligible ?Motor Planning: Witnin functional limits ?Motor Speech Errors: Not applicable    ?        ? ?Jamarious Febo E Jadore Veals , MA, CCC-SLP ?Acute Rehabilitation Services ?Office: [phone] ? ?11/21/2021,  11:06 AM ? ?

## 2021-11-21 NOTE — Progress Notes (Signed)
Echocardiogram ?2D Echocardiogram has been performed. ? ?Devonne Doughty ?11/21/2021, 2:30 PM ?

## 2021-11-21 NOTE — Progress Notes (Signed)
? ?PROGRESS NOTE ? ?Alex CorneaWilliam [name] ZOX:[id number] DOB: Dec 11, 1970 DOA: 11/20/2021 ?[facility] ? ?HPI/Recap of past 24 hours: ?Alex [name] is a 51 y.o. male with medical history significant of HTN, alcohol abuse, tobacco abuse, diastolic CHF who presented with right side numbness, headache, blurred vision, tingling 3 days.  Has been drinking heavily for the past few days.  3 days ago he started to have double vision, and felt like the right side of his body was "numb." ?He slept but when he woke up his symptoms persisted.  No prior CVA.  He drinks on regula basis.  Says he has had the shakes in the past.  He now only drinks weekends.  Can have up to 12 beers a day. ? ?MRI  - 1. Small acute infarct in the posterior and inferior left pons.  Associated edema without mass effect.  2. Mild chronic microvascular disease. ? ?11/21/2021: Patient was seen and examined at his bedside.  Endorses last alcohol intake was about a week ago. ? ?Assessment/Plan: ?Principal Problem: ?  CVA (cerebral vascular accident) (HCC) ?Active Problems: ?  Tobacco use ?  Alcohol use ?  Resistant hypertension ? ?CVA (cerebral vascular accident) (HCC) ?       MRI   Resulted - showing acute ischemic CVA  ?      CTA head and neck, 2D echo, neurochecks, LDL, A1c, PT/OT/speech assessment, continue to monitor on telemetry. ?Dual antiplatelet, statin ?Permissive hypertension treat SBP greater than 220 and DBP greater than 120. ?Appreciate neurology's assistance. ?Continue fall/aspiration precautions ? ?Tobacco use disorder ?Tobacco cessation counseling done at bedside ?Nicotine patch ?  ?Alcohol use disorder ?Alcohol cessation counseled on at bedside. ?No evidence of alcohol withdrawal at time of this visit. ?Patient endorses last alcohol beverage was about a week ago ?Continue to monitor for ETOH withdrawal ?Continue CIWA protcol ?  ?Hypertension ?Permissive hypertension in place. ?Normalize blood pressure gradually in the next 4 to 5  days ?  ? ?DVT prophylaxis:  SCD   ?  ?  ?Code Status:   DNR/DNI   as per patient  ?I had personally discussed CODE STATUS with patient   ?Confirmed code again patient insists he wishes to be DO NOT RESUSCITATE DO NOT INTUBATE ?  ?Family Communication:   None at bedside ?  ?  ?Disposition Plan:    To home once workup is complete and patient is stable  ? ?                   ?Consults called:  neurology  ?  ? ? ?Status is: Inpatient ?Patient requires at least 2 midnights for further evaluation and treatment of present condition. ? ? ? ?Objective: ?Vitals:  ? 11/21/21 0216 11/21/21 0400 11/21/21 0837 11/21/21 1228  ?BP: (!) 178/108 (!) 171/72 (!) 184/113 (!) 205/118  ?Pulse: 87 86 95 87  ?Resp: 19 18 (!) 23 12  ?Temp: (!) 97.5 ?F (36.4 ?C) 98.4 ?F (36.9 ?C) 97.9 ?F (36.6 ?C)   ?TempSrc: Oral Oral Oral   ?SpO2: 94% 95% 92% 93%  ? ? ?Intake/Output Summary (Last 24 hours) at 11/21/2021 1539 ?Last data filed at 11/21/2021 0446 ?Gross per 24 hour  ?Intake 0.45 ml  ?Output --  ?Net 0.45 ml  ? ?There were no vitals filed for this visit. ? ?Exam: ? ?General: 51 y.o. year-old male well developed well nourished in no acute distress.  Alert and oriented x3. ?Cardiovascular: Regular rate and rhythm with no rubs or gallops.  No thyromegaly or JVD noted.   ?Respiratory: Clear to auscultation with no wheezes or rales. Good inspiratory effort. ?Abdomen: Soft nontender nondistended with normal bowel sounds x4 quadrants. ?Musculoskeletal: No lower extremity edema. 2/4 pulses in all 4 extremities. ?Skin: No ulcerative lesions noted or rashes, ?Psychiatry: Mood is appropriate for condition and setting ? ? ?Data Reviewed: ?CBC: ?Recent Labs  ?Lab 11/20/21 ?1500 11/20/21 ?1527 11/20/21 ?2226  ?WBC 10.3  --  8.8  ?NEUTROABS 6.4  --  4.1  ?HGB 15.4 14.6 15.3  ?HCT 44.6 43.0 45.0  ?MCV 87.3  --  87.4  ?PLT 198  --  213  ? ?Basic Metabolic Panel: ?Recent Labs  ?Lab 11/20/21 ?1500 11/20/21 ?1527 11/20/21 ?2226  ?NA 135 138  --   ?K 3.6 3.5  --    ?CL 102 102  --   ?CO2 23  --   --   ?GLUCOSE 144* 148*  --   ?BUN 9 7  --   ?CREATININE 0.84 0.80  --   ?CALCIUM 8.6*  --   --   ?MG  --   --  2.1  ?PHOS  --   --  4.6  ? ?GFR: ?CrCl cannot be calculated (Unknown ideal weight.). ?Liver Function Tests: ?Recent Labs  ?Lab 11/20/21 ?1500 11/20/21 ?2226  ?AST 34 38  ?ALT 44 45*  ?ALKPHOS 73 68  ?BILITOT 0.5 0.6  ?PROT 7.3 7.1  ?ALBUMIN 3.7 3.6  ? ?No results for input(s): LIPASE, AMYLASE in the last 168 hours. ?No results for input(s): AMMONIA in the last 168 hours. ?Coagulation Profile: ?Recent Labs  ?Lab 11/20/21 ?1500  ?INR 0.9  ? ?Cardiac Enzymes: ?Recent Labs  ?Lab 11/20/21 ?2226  ?CKTOTAL 172  ? ?BNP (last 3 results) ?No results for input(s): PROBNP in the last 8760 hours. ?HbA1C: ?Recent Labs  ?  11/21/21 ?0159  ?HGBA1C 5.7*  ? ?CBG: ?No results for input(s): GLUCAP in the last 168 hours. ?Lipid Profile: ?Recent Labs  ?  11/21/21 ?0159  ?CHOL 164  ?HDL 47  ?LDLCALC 82  ?TRIG 176*  ?CHOLHDL 3.5  ? ?Thyroid Function Tests: ?Recent Labs  ?  11/20/21 ?2226  ?TSH 2.206  ? ?Anemia Panel: ?No results for input(s): VITAMINB12, FOLATE, FERRITIN, TIBC, IRON, RETICCTPCT in the last 72 hours. ?Urine analysis: ?   ?Component Value Date/Time  ? COLORURINE YELLOW 11/20/2021 1610  ? APPEARANCEUR CLEAR 11/20/2021 1610  ? LABSPEC 1.015 11/20/2021 1610  ? PHURINE 6.0 11/20/2021 1610  ? GLUCOSEU NEGATIVE 11/20/2021 1610  ? HGBUR NEGATIVE 11/20/2021 1610  ? BILIRUBINUR NEGATIVE 11/20/2021 1610  ? KETONESUR NEGATIVE 11/20/2021 1610  ? PROTEINUR NEGATIVE 11/20/2021 1610  ? NITRITE NEGATIVE 11/20/2021 1610  ? LEUKOCYTESUR NEGATIVE 11/20/2021 1610  ? ?Sepsis Labs: ?@LABRCNTIP (procalcitonin:4,lacticidven:4) ? ?) ?Recent Results (from the past 240 hour(s))  ?Resp Panel by RT-PCR (Flu A&B, Covid)     Status: None  ? Collection Time: 11/20/21  4:27 PM  ? Specimen: Nasopharyngeal(NP) swabs in vial transport medium  ?Result Value Ref Range Status  ? SARS Coronavirus 2 by RT PCR NEGATIVE  NEGATIVE Final  ?  Comment: (NOTE) ?SARS-CoV-2 target nucleic acids are NOT DETECTED. ? ?The SARS-CoV-2 RNA is generally detectable in upper respiratory ?specimens during the acute phase of infection. The lowest ?concentration of SARS-CoV-2 viral copies this assay can detect is ?138 copies/mL. A negative result does not preclude SARS-Cov-2 ?infection and should not be used as the sole basis for treatment or ?other patient management decisions. A negative result may  occur with  ?improper specimen collection/handling, submission of specimen other ?than nasopharyngeal swab, presence of viral mutation(s) within the ?areas targeted by this assay, and inadequate number of viral ?copies(<138 copies/mL). A negative result must be combined with ?clinical observations, patient history, and epidemiological ?information. The expected result is Negative. ? ?Fact Sheet for Patients:  ?BloggerCourse.com ? ?Fact Sheet for Healthcare Providers:  ?SeriousBroker.it ? ?This test is no t yet approved or cleared by the [place] FDA and  ?has been authorized for detection and/or diagnosis of SARS-CoV-2 by ?FDA under an Emergency Use Authorization (EUA). This EUA will remain  ?in effect (meaning this test can be used) for the duration of the ?COVID-19 declaration under Section 564(b)(1) of the Act, 21 ?U.S.C.section 360bbb-3(b)(1), unless the authorization is terminated  ?or revoked sooner.  ? ? ?  ? Influenza A by PCR NEGATIVE NEGATIVE Final  ? Influenza B by PCR NEGATIVE NEGATIVE Final  ?  Comment: (NOTE) ?The Xpert Xpress SARS-CoV-2/FLU/RSV plus assay is intended as an aid ?in the diagnosis of influenza from Nasopharyngeal swab specimens and ?should not be used as a sole basis for treatment. Nasal washings and ?aspirates are unacceptable for Xpert Xpress SARS-CoV-2/FLU/RSV ?testing. ? ?Fact Sheet for Patients: ?BloggerCourse.com ? ?Fact Sheet for Healthcare  Providers: ?SeriousBroker.it ? ?This test is not yet approved or cleared by the [place] FDA and ?has been authorized for detection and/or diagnosis of SARS-CoV-2 by ?FDA under a

## 2021-11-21 NOTE — Evaluation (Addendum)
Physical Therapy Evaluation ?Patient Details ?Name: Alex [name] ?MRN: [id number] ?DOB: 09/15/1970 ?Today's Date: 11/21/2021 ? ?History of Present Illness ? This 51 yo male presented 3/19 with double vision, dizziness, and right arm and right leg numbness.  MRI: Small acute infarct in the posterior and inferior left pons. Associated edema without mass effect.PHMx:ETOH, occassional illicit drug use, tobacco, COPD, HTN.  ?Clinical Impression ? Pt admitted with above diagnosis. Pt was able to ambulate without device with overall steady gait in hallway. Also able to complete ascending and descending steps.  Educated in strategies to use and that he should wear his glasses at all times when mobilizing.  Scored 20/24 on DGI suggesting low risk of falls. Pt should progress well and will benefit from Outpt PT to address balance and endurance.  Pt currently with functional limitations due to the deficits listed below (see PT Problem List). Pt will benefit from skilled PT to increase their independence and safety with mobility to allow discharge to the venue listed below.      ?   ? ?Recommendations for follow up therapy are one component of a multi-disciplinary discharge planning process, led by the attending physician.  Recommendations may be updated based on patient status, additional functional criteria and insurance authorization. ? ?Follow Up Recommendations Outpatient PT ? ?  ?Assistance Recommended at Discharge PRN  ?Patient can return home with the following ?   ? ?  ?Equipment Recommendations None recommended by PT  ?Recommendations for Other Services ?    ?  ?Functional Status Assessment Patient has had a recent decline in their functional status and demonstrates the ability to make significant improvements in function in a reasonable and predictable amount of time.  ? ?  ?Precautions / Restrictions Precautions ?Precautions: Fall ?Restrictions ?Weight Bearing Restrictions: No  ? ?  ? ?Mobility ? Bed Mobility ?Overal  bed mobility: Independent ?  ?  ?  ?  ?  ?  ?  ?  ? ?Transfers ?Overall transfer level: Needs assistance ?Equipment used: None ?Transfers: Sit to/from Stand ?Sit to Stand: Supervision ?  ?  ?  ?  ?  ?General transfer comment: overall at a S level due to vision ?  ? ?Ambulation/Gait ?Ambulation/Gait assistance: Min guard, Min assist ?Gait Distance (Feet): 450 Feet ?Assistive device: None ?Gait Pattern/deviations: Step-through pattern, Decreased stride length, Drifts right/left ?  ?Gait velocity interpretation: 1.31 - 2.62 ft/sec, indicative of limited community ambulator ?  ?General Gait Details: Pt was able to ambulate with overall good balance wearing his glassess that OT taped for his visual deficit. A few small LOB but pt was able to self correct. Can accept challenges to balance as well and pt was given strategies to use when to help with balance.  When not wearing glasses, has difficulty with balance. ? ?Stairs ?Stairs: Yes ?Stairs assistance: Supervision ?Stair Management: One rail Right, Alternating pattern, Forwards ?Number of Stairs: 10 ?General stair comments: No problems with use of rail ? ?Wheelchair Mobility ?  ? ?Modified Rankin (Stroke Patients Only) ?Modified Rankin (Stroke Patients Only) ?Pre-Morbid Rankin Score: No symptoms ?Modified Rankin: Moderately severe disability ? ?  ? ?Balance Overall balance assessment: Needs assistance ?Sitting-balance support: No upper extremity supported, Feet supported ?Sitting balance-Leahy Scale: Fair ?  ?  ?Standing balance support: No upper extremity supported, During functional activity ?Standing balance-Leahy Scale: Good ?  ?  ?  ?  ?  ?  ?  ?  ?  ?Standardized Balance Assessment ?Standardized Balance Assessment : Dynamic  Gait Index ?  ?Dynamic Gait Index ?Level Surface: Normal ?Change in Gait Speed: Normal ?Gait with Horizontal Head Turns: Mild Impairment ?Gait with Vertical Head Turns: Mild Impairment ?Gait and Pivot Turn: Normal ?Step Over Obstacle: Mild  Impairment ?Step Around Obstacles: Normal ?Steps: Mild Impairment ?Total Score: 20 ?   ? ? ? ?Pertinent Vitals/Pain Pain Assessment ?Faces Pain Scale: No hurt  ? ? ?Home Living Family/patient expects to be discharged to:: Private residence ?Living Arrangements: Non-relatives/Friends ?Available Help at Discharge:  (roommate--works) ?Type of Home: House ?Home Access: Stairs to enter ?Entrance Stairs-Rails: Right (going up) ?Entrance Stairs-Number of Steps: 4 ?  ?Home Layout: One level ?Home Equipment: None ?   ?  ?Prior Function Prior Level of Function : Independent/Modified Independent ?  ?  ?  ?  ?  ?  ?Mobility Comments: does not drive ?  ?  ? ? ?Hand Dominance  ? Dominant Hand: Right ? ?  ?Extremity/Trunk Assessment  ? Upper Extremity Assessment ?Upper Extremity Assessment: RUE deficits/detail ?RUE Sensation: decreased light touch (decreased sensation of temperature) ?  ? ?Lower Extremity Assessment ?Lower Extremity Assessment: RLE deficits/detail ?RLE Deficits / Details: grossly 4/5 ?RLE Sensation: decreased light touch ?  ? ?   ?Communication  ? Communication: No difficulties  ?Cognition Arousal/Alertness: Awake/alert ?Behavior During Therapy: [facility] for tasks assessed/performed ?Overall Cognitive Status: Within Functional Limits for tasks assessed ?  ?  ?  ?  ?  ?  ?  ?  ?  ?  ?  ?  ?  ?  ?  ?  ?  ?  ?  ? ?  ?General Comments   ? ?  ?Exercises    ? ?Assessment/Plan  ?  ?PT Assessment Patient needs continued PT services  ?PT Problem List Decreased activity tolerance;Decreased balance;Decreased mobility;Decreased knowledge of use of DME;Decreased safety awareness;Decreased knowledge of precautions ? ?   ?  ?PT Treatment Interventions DME instruction;Gait training;Functional mobility training;Stair training;Therapeutic activities;Therapeutic exercise;Balance training;Patient/family education   ? ?PT Goals (Current goals can be found in the Care Plan section)  ?Acute Rehab PT Goals ?Patient Stated Goal: to go home ?PT  Goal Formulation: With patient ?Time For Goal Achievement: 12/05/21 ?Potential to Achieve Goals: Good ? ?  ?Frequency Min 4X/week ?  ? ? ?Co-evaluation   ?  ?  ?  ?  ? ? ?  ?AM-PAC PT "6 Clicks" Mobility  ?Outcome Measure Help needed turning from your back to your side while in a flat bed without using bedrails?: None ?Help needed moving from lying on your back to sitting on the side of a flat bed without using bedrails?: None ?Help needed moving to and from a bed to a chair (including a wheelchair)?: A Little ?Help needed standing up from a chair using your arms (e.g., wheelchair or bedside chair)?: A Little ?Help needed to walk in hospital room?: A Lot ?Help needed climbing 3-5 steps with a railing? : A Little ?6 Click Score: 19 ? ?  ?End of Session Equipment Utilized During Treatment: Gait belt ?Activity Tolerance: Patient tolerated treatment well ?Patient left: in chair;with call bell/phone within reach;with chair alarm set ?Nurse Communication: Mobility status ?PT Visit Diagnosis: Muscle weakness (generalized) (M62.81) ?  ? ?Time: 6812-7517 ?PT Time Calculation (min) (ACUTE ONLY): 18 min ? ? ?Charges:   PT Evaluation ?$PT Eval Moderate Complexity: 1 Mod ?  ?  ?   ? ? ?Nelli Swalley M,PT ?Acute Rehab Services ?[phone] ?[phone] (pager)  ? ?Bevelyn Buckles ?11/21/2021, 2:01  PM ? ?

## 2021-11-21 NOTE — Progress Notes (Signed)
STROKE TEAM PROGRESS NOTE  ? ?INTERVAL HISTORY ?No family is at the bedside.  Patient lying in bed, still has diplopia on left gaze, left facial droop, right-sided hemiparesthesia.  MRI showed left dorsal pontine infarct.  On DAPT.  Smoking cessation education provided. ? ?OBJECTIVE ?Vitals:  ? 11/20/21 2211 11/21/21 0015 11/21/21 0216 11/21/21 0400  ?BP: (!) 206/128 (!) 179/111 (!) 178/108 (!) 171/72  ?Pulse: 95 86 87 86  ?Resp: 19 20 19 18   ?Temp: 98.7 ?F (37.1 ?C) 98.1 ?F (36.7 ?C) (!) 97.5 ?F (36.4 ?C) 98.4 ?F (36.9 ?C)  ?TempSrc: Oral Oral Oral Oral  ?SpO2: 95% 95% 94% 95%  ? ? ?CBC:  ?Recent Labs  ?Lab 11/20/21 ?1500 11/20/21 ?1527 11/20/21 ?2226  ?WBC 10.3  --  8.8  ?NEUTROABS 6.4  --  4.1  ?HGB 15.4 14.6 15.3  ?HCT 44.6 43.0 45.0  ?MCV 87.3  --  87.4  ?PLT 198  --  213  ? ? ?Basic Metabolic Panel:  ?Recent Labs  ?Lab 11/20/21 ?1500 11/20/21 ?1527 11/20/21 ?2226  ?NA 135 138  --   ?K 3.6 3.5  --   ?CL 102 102  --   ?CO2 23  --   --   ?GLUCOSE 144* 148*  --   ?BUN 9 7  --   ?CREATININE 0.84 0.80  --   ?CALCIUM 8.6*  --   --   ?MG  --   --  2.1  ?PHOS  --   --  4.6  ? ? ?Lipid Panel:  ?   ?Component Value Date/Time  ? CHOL 164 11/21/2021 0159  ? TRIG 176 (H) 11/21/2021 0159  ? HDL 47 11/21/2021 0159  ? CHOLHDL 3.5 11/21/2021 0159  ? VLDL 35 11/21/2021 0159  ? LDLCALC 82 11/21/2021 0159  ? ?HgbA1c:  ?Lab Results  ?Component Value Date  ? HGBA1C 5.7 (H) 11/21/2021  ? ?Urine Drug Screen:  ?   ?Component Value Date/Time  ? LABOPIA NONE DETECTED 11/20/2021 1610  ? COCAINSCRNUR NONE DETECTED 11/20/2021 1610  ? LABBENZ NONE DETECTED 11/20/2021 1610  ? AMPHETMU NONE DETECTED 11/20/2021 1610  ? THCU NONE DETECTED 11/20/2021 1610  ? LABBARB NONE DETECTED 11/20/2021 1610  ?  ?Alcohol Level  ?   ?Component Value Date/Time  ? ETH <10 11/20/2021 1500  ? ? ?IMAGING ? ? ?CT ANGIO HEAD NECK W WO CM ? ?Result Date: 11/20/2021 ?CLINICAL DATA:  Headache and blurry vision EXAM: CT ANGIOGRAPHY HEAD AND NECK TECHNIQUE: Multidetector  CT imaging of the head and neck was performed using the standard protocol during bolus administration of intravenous contrast. Multiplanar CT image reconstructions and MIPs were obtained to evaluate the vascular anatomy. Carotid stenosis measurements (when applicable) are obtained utilizing NASCET criteria, using the distal internal carotid diameter as the denominator. RADIATION DOSE REDUCTION: This exam was performed according to the departmental dose-optimization program which includes automated exposure control, adjustment of the mA and/or kV according to patient size and/or use of iterative reconstruction technique. CONTRAST:  100mL OMNIPAQUE IOHEXOL 350 MG/ML SOLN COMPARISON:  None. FINDINGS: CTA NECK FINDINGS SKELETON: There is no bony spinal canal stenosis. No lytic or blastic lesion. OTHER NECK: Normal pharynx, larynx and major salivary glands. No cervical lymphadenopathy. Unremarkable thyroid gland. UPPER CHEST: Incompletely visualized calcified granuloma in the left upper lobe. AORTIC ARCH: There is no calcific atherosclerosis of the aortic arch. There is no aneurysm, dissection or hemodynamically significant stenosis of the visualized portion of the aorta. Conventional 3 vessel aortic branching  pattern. The visualized proximal subclavian arteries are widely patent. RIGHT CAROTID SYSTEM: No dissection, occlusion or aneurysm. There is calcified atherosclerosis extending into the proximal ICA, resulting in 50% stenosis. LEFT CAROTID SYSTEM: Normal without aneurysm, dissection or stenosis. VERTEBRAL ARTERIES: Left dominant configuration. Both origins are clearly patent. There is no dissection, occlusion or flow-limiting stenosis to the skull base (V1-V3 segments). CTA HEAD FINDINGS POSTERIOR CIRCULATION: --Vertebral arteries: Normal V4 segments. --Inferior cerebellar arteries: Normal. --Basilar artery: Normal. --Superior cerebellar arteries: Normal. --Posterior cerebral arteries (PCA): Normal. ANTERIOR  CIRCULATION: --Intracranial internal carotid arteries: Normal. --Anterior cerebral arteries (ACA): Normal. Both A1 segments are present. Patent anterior communicating artery (a-comm). --Middle cerebral arteries (MCA): Normal. VENOUS SINUSES: As permitted by contrast timing, patent. ANATOMIC VARIANTS: Fetal origin of the right posterior cerebral artery. Review of the MIP images confirms the above findings. IMPRESSION: 1. No emergent large vessel occlusion or high-grade stenosis of the intracranial arteries. 2. Approximately 50% stenosis of the proximal right internal carotid artery secondary to calcified atherosclerosis. Electronically Signed   By: Deatra Robinson M.D.   On: 11/20/2021 21:04  ? ?CT Head Wo Contrast ? ?Result Date: 11/20/2021 ?CLINICAL DATA:  Transient ischemic attack. EXAM: CT HEAD WITHOUT CONTRAST TECHNIQUE: Contiguous axial images were obtained from the base of the skull through the vertex without intravenous contrast. RADIATION DOSE REDUCTION: This exam was performed according to the departmental dose-optimization program which includes automated exposure control, adjustment of the mA and/or kV according to patient size and/or use of iterative reconstruction technique. COMPARISON:  Head CT and brain MRI 01/01/2021 FINDINGS: Brain: No intracranial hemorrhage, mass effect, or midline shift. No hydrocephalus. The basilar cisterns are patent. No evidence of territorial infarct or acute ischemia. No extra-axial or intracranial fluid collection. Vascular: Atherosclerosis of skullbase vasculature without hyperdense vessel or abnormal calcification. Skull: No fracture or focal lesion. Sinuses/Orbits: Mucosal thickening of ethmoid air cells. Mastoid air cells are clear. Unremarkable orbits. Other: Midline frontal scalp lipoma.  No internal complexity. IMPRESSION: No acute intracranial abnormality. Electronically Signed   By: Narda Rutherford M.D.   On: 11/20/2021 15:22  ? ?MR BRAIN WO CONTRAST ? ?Result Date:  11/20/2021 ?CLINICAL DATA:  Neuro deficit, acute, stroke suspected EXAM: MRI HEAD WITHOUT CONTRAST TECHNIQUE: Multiplanar, multiecho pulse sequences of the brain and surrounding structures were obtained without intravenous contrast. COMPARISON:  Same day CT head.  MRI January 01, 2021. FINDINGS: Brain: Small acute infarct in the posterior and inferior left pons (series 5, image 70). Associated edema without mass effect. Additional scattered mild T2/FLAIR hyperintensities in the white matter nonspecific but compatible with chronic microvascular disease. No evidence of acute hemorrhage, mass lesion, midline shift, hydrocephalus or extra-axial fluid collection. Vascular: Major arterial flow voids are maintained at the skull base. Skull and upper cervical spine: Normal marrow signal. Benign forehead scalp lipoma. Sinuses/Orbits: Mild paranasal sinus mucosal thickening with maxillary sinus retention cyst. No acute orbital findings. Other: No significant mastoid effusions. IMPRESSION: 1. Small acute infarct in the posterior and inferior left pons. Associated edema without mass effect. 2. Mild chronic microvascular disease. Electronically Signed   By: Feliberto Harts M.D.   On: 11/20/2021 18:12  ? ?DG CHEST PORT 1 VIEW ? ?Result Date: 11/20/2021 ?CLINICAL DATA:  Headache, blurred vision EXAM: PORTABLE CHEST 1 VIEW COMPARISON:  01/01/2021 FINDINGS: Lungs are clear.  No pleural effusion or pneumothorax. The heart is normal in size. IMPRESSION: No evidence of acute cardiopulmonary disease. Electronically Signed   By: Charline Bills M.D.   On: 11/20/2021  19:19   ? ? ?ECG - SR rate 79 BPM. (See cardiology reading for complete details) ? ? ?PHYSICAL EXAM ? ?Temp:  [97.5 ?F (36.4 ?C)-98.6 ?F (37 ?C)] 98.6 ?F (37 ?C) (03/20 2054) ?Pulse Rate:  [82-95] 82 (03/20 2054) ?Resp:  [12-23] 14 (03/20 1615) ?BP: (171-208)/(72-122) 184/105 (03/20 2054) ?SpO2:  [92 %-100 %] 100 % (03/20 2054) ? ?General - Well nourished, well developed, in  no apparent distress. ? ?Ophthalmologic - fundi not visualized due to noncooperation. ? ?Cardiovascular - Regular rhythm and rate. ? ?Neuro - awake, alert, eyes open, orientated to age, place, time. No apha

## 2021-11-21 NOTE — Progress Notes (Incomplete)
Initial Nutrition Assessment ? ?DOCUMENTATION CODES:  ? ?  ? ?INTERVENTION:  ? ?Continue Multivitamin w/ minerals daily ?Recommend obtaining a new weight. RN notified.  ? ? ?NUTRITION DIAGNOSIS:  ? ?  related to   as evidenced by  . ? ?GOAL:  ? ?  ? ?MONITOR:  ? ?  ? ?REASON FOR ASSESSMENT:  ? ?Malnutrition Screening Tool ?  ? ?ASSESSMENT:  ? ?51 y.o. male presented to the ED with R side numbness, blurred vision, and a headache. PMH includes HTN, EtOH abuse, and CHF. Pt admitted with a CVA.  ? ?No weights within EMR to assess weight loss. There is no new weight entered from this admission.  ? ? ?Medications reviewed and include: Folic acid, MVI, Thiamine ?Labs reviewed. ? ?NUTRITION - FOCUSED PHYSICAL EXAM: ? ?{RD Focused Exam List:21252} ? ?Diet Order:   ?Diet Order   ? ?       ?  Diet Heart Room service appropriate? Yes; Fluid consistency: Thin  Diet effective now       ?  ? ?  ?  ? ?  ? ? ?EDUCATION NEEDS:  ? ?  ? ?Skin:  Skin Assessment: Reviewed RN Assessment ? ?Last BM:  No Documentation ? ?Height:  ? ?Ht Readings from Last 1 Encounters:  ?01/01/21 5\' 11"  (1.803 m)  ? ? ?Weight:  ? ?Wt Readings from Last 1 Encounters:  ?01/01/21 96.6 kg  ? ? ?Ideal Body Weight:  78.2 kg ? ?BMI:  There is no height or weight on file to calculate BMI. ? ?Estimated Nutritional Needs:  ? ?Kcal:    ? ?Protein:    ? ?Fluid:    ? ? ? ?01/03/21 RD, LDN ?Clinical Dietitian ?See AMi[facility] for contact information.  ? ?

## 2021-11-21 NOTE — TOC Initial Note (Signed)
Transition of Care (TOC) - Initial/Assessment Note  ? ? ?Patient Details  ?Name: Alex [name] ?MRN: [id number] ?Date of Birth: 1970/11/28 ? ?Transition of Care (TOC) CM/SW Contact:    ?Mearl Latin, LCSW ?Phone Number: ?11/21/2021, 1:54 PM ? ?Clinical Narrative:                 ?CSW spoke with patient and provided ETOH and smoking cessation resources. He stated he has [facility] but has been unsuccessful. He gave CSW permission to contact the [facility] to try to help. He stated he lives with a roommate and has a ride at discharge.  ? ?CSW contacted the [place] and obtained info for a social worker there, [place] [phone] x [phone] Per Sander Radon she will have the scheduling team reach out to patient (confirmed his cell is [phone]) to set up a PCP at [facility] and she will also ask about coverage for outpatient therapies if patient requires them.  ? ?Expected Discharge Plan: Home/Self Care ?Barriers to Discharge: No Barriers Identified ? ? ?Patient Goals and CMS Choice ?Patient states their goals for this hospitalization and ongoing recovery are:: Feel better ?  ?  ? ?Expected Discharge Plan and Services ?Expected Discharge Plan: Home/Self Care ?In-house Referral: Clinical Social Work ?  ?  ?Living arrangements for the past 2 months: Single Family Home ?                ?  ?  ?  ?  ?  ?  ?  ?  ?  ?  ? ?Prior Living Arrangements/Services ?Living arrangements for the past 2 months: Single Family Home ?Lives with:: Roommate ?Patient language and need for interpreter reviewed:: Yes ?Do you feel safe going back to the place where you live?: Yes      ?Need for Family Participation in Patient Care: No (Comment) ?Care giver support system in place?: Yes (comment) ?  ?Criminal Activity/Legal Involvement Pertinent to Current Situation/Hospitalization: No - Comment as needed ? ?Activities of Daily Living ?Home Assistive Devices/Equipment: Eyeglasses ?ADL Screening (condition at time of admission) ?Patient's  cognitive ability adequate to safely complete daily activities?: Yes ?Is the patient deaf or have difficulty hearing?: No ?Does the patient have difficulty seeing, even when wearing glasses/contacts?: Yes ?Does the patient have difficulty concentrating, remembering, or making decisions?: No ?Patient able to express need for assistance with ADLs?: Yes ?Does the patient have difficulty dressing or bathing?: No ?Independently performs ADLs?: Yes (appropriate for developmental age) ?Does the patient have difficulty walking or climbing stairs?: No ?Weakness of Legs: None ?Weakness of Arms/Hands: None ? ?Permission Sought/Granted ?Permission sought to share information with : Magazine features editor ?Permission granted to share information with : Yes, Verbal Permission Granted ?   ? Permission granted to share info w AGENCY: [facility] ?   ?   ? ?Emotional Assessment ?Appearance:: Appears stated age ?Attitude/Demeanor/Rapport: Engaged ?Affect (typically observed): Accepting, Appropriate ?Orientation: : Oriented to Self, Oriented to Place, Oriented to  Time, Oriented to Situation ?Alcohol / Substance Use: Alcohol Use, Tobacco Use ?Psych Involvement: No (comment) ? ?Admission diagnosis:  CVA (cerebral vascular accident) (HCC) [I63.9] ?Cerebrovascular accident (CVA), unspecified mechanism (HCC) [I63.9] ?Patient Active Problem List  ? Diagnosis Date Noted  ? CVA (cerebral vascular accident) (HCC) 11/20/2021  ? Hypertensive urgency 01/01/2021  ? Tobacco use 01/01/2021  ? Alcohol use 01/01/2021  ? Substance use 01/01/2021  ? Vertigo 01/01/2021  ? 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 ?PCP[facility]  [facility] [place]  [facility] ?Pharmacy:   ?[id number] [address], [place] - [phone] AT [facility] [address] ?[address] ?[place] [address] ?Phone: [phone] Fax: [phone] ? ? ? ? ?Social Determinants of Health (SDOH) Interventions ?  ? ?Readmission Risk Interventions ?No flowsheet data found. ? ? ?

## 2021-11-21 NOTE — Plan of Care (Signed)
?  Problem: Education: Goal: Knowledge of General Education information will improve Description: Including pain rating scale, medication(s)/side effects and non-pharmacologic comfort measures Outcome: Progressing   Problem: Health Behavior/Discharge Planning: Goal: Ability to manage health-related needs will improve Outcome: Progressing   Problem: Clinical Measurements: Goal: Ability to maintain clinical measurements within normal limits will improve Outcome: Progressing Goal: Will remain free from infection Outcome: Progressing Goal: Diagnostic test results will improve Outcome: Progressing Goal: Respiratory complications will improve Outcome: Progressing Goal: Cardiovascular complication will be avoided Outcome: Progressing   Problem: Activity: Goal: Risk for activity intolerance will decrease Outcome: Progressing   Problem: Nutrition: Goal: Adequate nutrition will be maintained Outcome: Progressing   Problem: Coping: Goal: Level of anxiety will decrease Outcome: Progressing   Problem: Elimination: Goal: Will not experience complications related to bowel motility Outcome: Progressing Goal: Will not experience complications related to urinary retention Outcome: Progressing   Problem: Pain Managment: Goal: General experience of comfort will improve Outcome: Progressing   Problem: Safety: Goal: Ability to remain free from injury will improve Outcome: Progressing   Problem: Skin Integrity: Goal: Risk for impaired skin integrity will decrease Outcome: Progressing   Problem: Education: Goal: Knowledge of disease or condition will improve Outcome: Progressing Goal: Knowledge of secondary prevention will improve (SELECT ALL) Outcome: Progressing   Problem: Education: Goal: Knowledge of General Education information will improve Description: Including pain rating scale, medication(s)/side effects and non-pharmacologic comfort measures Outcome: Progressing    Problem: Health Behavior/Discharge Planning: Goal: Ability to manage health-related needs will improve Outcome: Progressing   Problem: Clinical Measurements: Goal: Ability to maintain clinical measurements within normal limits will improve Outcome: Progressing Goal: Will remain free from infection Outcome: Progressing Goal: Diagnostic test results will improve Outcome: Progressing Goal: Respiratory complications will improve Outcome: Progressing Goal: Cardiovascular complication will be avoided Outcome: Progressing   Problem: Activity: Goal: Risk for activity intolerance will decrease Outcome: Progressing   Problem: Nutrition: Goal: Adequate nutrition will be maintained Outcome: Progressing   Problem: Coping: Goal: Level of anxiety will decrease Outcome: Progressing   Problem: Elimination: Goal: Will not experience complications related to bowel motility Outcome: Progressing Goal: Will not experience complications related to urinary retention Outcome: Progressing   Problem: Pain Managment: Goal: General experience of comfort will improve Outcome: Progressing   Problem: Safety: Goal: Ability to remain free from injury will improve Outcome: Progressing   Problem: Skin Integrity: Goal: Risk for impaired skin integrity will decrease Outcome: Progressing   Problem: Education: Goal: Knowledge of disease or condition will improve Outcome: Progressing Goal: Knowledge of secondary prevention will improve (SELECT ALL) Outcome: Progressing   

## 2021-11-22 ENCOUNTER — Other Ambulatory Visit (HOSPITAL_COMMUNITY): Payer: Self-pay

## 2021-11-22 DIAGNOSIS — I6302 Cerebral infarction due to thrombosis of basilar artery: Secondary | ICD-10-CM | POA: Diagnosis not present

## 2021-11-22 DIAGNOSIS — Z789 Other specified health status: Secondary | ICD-10-CM | POA: Diagnosis not present

## 2021-11-22 DIAGNOSIS — I10 Essential (primary) hypertension: Secondary | ICD-10-CM | POA: Diagnosis not present

## 2021-11-22 DIAGNOSIS — Z720 Tobacco use: Secondary | ICD-10-CM | POA: Diagnosis not present

## 2021-11-22 MED ORDER — AMLODIPINE BESYLATE 10 MG PO TABS
10.0000 mg | ORAL_TABLET | Freq: Every day | ORAL | 3 refills | Status: DC
  Filled 2021-11-22: qty 30, 30d supply, fill #0

## 2021-11-22 MED ORDER — LISINOPRIL 40 MG PO TABS
40.0000 mg | ORAL_TABLET | Freq: Every day | ORAL | 3 refills | Status: DC
  Filled 2021-11-22: qty 30, 30d supply, fill #0

## 2021-11-22 MED ORDER — CLOPIDOGREL BISULFATE 75 MG PO TABS
75.0000 mg | ORAL_TABLET | Freq: Every day | ORAL | 0 refills | Status: DC
  Filled 2021-11-22: qty 21, 21d supply, fill #0

## 2021-11-22 MED ORDER — HYDRALAZINE HCL 25 MG PO TABS
25.0000 mg | ORAL_TABLET | Freq: Three times a day (TID) | ORAL | 3 refills | Status: DC
  Filled 2021-11-22: qty 90, 30d supply, fill #0

## 2021-11-22 MED ORDER — ROSUVASTATIN CALCIUM 10 MG PO TABS
10.0000 mg | ORAL_TABLET | Freq: Every day | ORAL | 3 refills | Status: DC
  Filled 2021-11-22: qty 30, 30d supply, fill #0

## 2021-11-22 MED ORDER — HYDROCHLOROTHIAZIDE 25 MG PO TABS
25.0000 mg | ORAL_TABLET | Freq: Every day | ORAL | 3 refills | Status: DC
Start: 2021-11-22 — End: 2024-02-25
  Filled 2021-11-22: qty 30, 30d supply, fill #0

## 2021-11-22 MED ORDER — FOLIC ACID 1 MG PO TABS
1.0000 mg | ORAL_TABLET | Freq: Every day | ORAL | 0 refills | Status: DC
  Filled 2021-11-22: qty 30, 30d supply, fill #0

## 2021-11-22 MED ORDER — ASPIRIN 81 MG PO TBEC
81.0000 mg | DELAYED_RELEASE_TABLET | Freq: Every day | ORAL | 11 refills | Status: DC
Start: 2021-11-22 — End: 2024-02-25
  Filled 2021-11-22: qty 30, 30d supply, fill #0

## 2021-11-22 NOTE — Plan of Care (Signed)
?  Problem: Clinical Measurements: ?Goal: Will remain free from infection ?Outcome: Progressing ?Goal: Respiratory complications will improve ?Outcome: Progressing ?  ?Problem: Clinical Measurements: ?Goal: Respiratory complications will improve ?Outcome: Progressing ?  ?Problem: Activity: ?Goal: Risk for activity intolerance will decrease ?Outcome: Progressing ?  ?Problem: Nutrition: ?Goal: Adequate nutrition will be maintained ?Outcome: Progressing ?  ?Problem: Coping: ?Goal: Level of anxiety will decrease ?Outcome: Progressing ?  ?Problem: Elimination: ?Goal: Will not experience complications related to urinary retention ?Outcome: Progressing ?  ?

## 2021-11-22 NOTE — TOC CAGE-AID Note (Signed)
Transition of Care (TOC) - CAGE-AID Screening ? ? ?Patient Details  ?Name: Alex [name] ?MRN: [id number] ?Date of Birth: 1971/07/10 ? ?Transition of Care (TOC) CM/SW Contact:    ?Ronita Hargreaves C Tarpley-Carter, LCSWA ?Phone Number: ?11/22/2021, 1:35 PM ? ? ?Clinical Narrative: ?Pt participated in Cage-Aid.  Pt stated he does use substance and ETOH.  Pt was offered resources, due to usage of substance and ETOH.    ? ?Insurance underwriter, MSW, LCSW-A ?Pronouns:  She/Her/Hers ?[facility] ?Clinical Social Worker ?Direct Number:  [phone] ?[email or web address]  ? ?CAGE-AID Screening: ?  ? ?Have You Ever Felt You Ought to Cut Down on Your Drinking or Drug Use?: Yes ?Have People Annoyed You By Critizing Your Drinking Or Drug Use?: No ?Have You Felt Bad Or Guilty About Your Drinking Or Drug Use?: No ?Have You Ever Had a Drink or Used Drugs First Thing In The Morning to Steady Your Nerves or to Get Rid of a Hangover?: No ?CAGE-AID Score: 1 ? ?Substance Abuse Education Offered: Yes ? ?Substance abuse interventions: Educational Materials ? ? ? ? ? ? ?

## 2021-11-22 NOTE — Progress Notes (Signed)
Nsg Discharge Note ? ?Admit Date:  11/20/2021 ?Discharge date: 11/22/2021 ?  ?[name] to be D/C'd home per MD order.  AVS completed. Patient/caregiver able to verbalize understanding. ? ?Discharge Medication: ?Allergies as of 11/22/2021   ?No Known Allergies ?  ? ?  ?Medication List  ?  ? ?STOP taking these medications   ? ?ibuprofen 200 MG tablet ?Commonly known as: ADVIL ?  ? ?  ? ?TAKE these medications   ? ?amLODipine 10 MG tablet ?Commonly known as: NORVASC ?Take 1 tablet (10 mg total) by mouth daily. ?Notes to patient: Tomorrow 11/23/21 ?  ?aspirin 81 MG EC tablet ?Take 1 tablet (81 mg total) by mouth daily. Swallow whole. ?Notes to patient: Tomorrow 11/23/21 ?  ?clopidogrel 75 MG tablet ?Commonly known as: PLAVIX ?Take 1 tablet (75 mg total) by mouth daily. ?Notes to patient: Tomorrow 11/23/21 ?  ?folic acid 1 MG tablet ?Commonly known as: FOLVITE ?Take 1 tablet (1 mg total) by mouth daily. ?Notes to patient: Tomorrow 11/23/21 ?  ?hydrALAZINE 25 MG tablet ?Commonly known as: APRESOLINE ?Take 1 tablet (25 mg total) by mouth 3 (three) times daily. ?Notes to patient: Today 11/22/21 ?  ?hydrochlorothiazide 25 MG tablet ?Commonly known as: HYDRODIURIL ?Take 1 tablet (25 mg total) by mouth daily. ?What changed: how much to take ?Notes to patient: Tomorrow 11/23/21 ?  ?lisinopril 40 MG tablet ?Commonly known as: ZESTRIL ?Take 1 tablet (40 mg total) by mouth daily. ?What changed: medication strength ?Notes to patient: Tomorrow 11/23/21 ?  ?nicotine 21 mg/24hr patch ?Commonly known as: NICODERM CQ - dosed in mg/24 hours ?Place 1 patch (21 mg total) onto the skin daily. ?Notes to patient: Tomorrow 11/23/21 ?  ?rosuvastatin 10 MG tablet ?Commonly known as: CRESTOR ?Take 1 tablet (10 mg total) by mouth daily. ?Notes to patient: Tomorrow 11/23/21 ?  ? ?  ? ? ?Discharge Assessment: ?Vitals:  ? 11/22/21 0400 11/22/21 0800  ?BP: (!) 166/90 (!) 152/75  ?Pulse: 76 86  ?Resp: 18 19  ?Temp: 97.6 ?F (36.4 ?C) 97.9 ?F (36.6 ?C)  ?SpO2:  91% 93%  ? Skin clean, dry and intact without evidence of skin break down, no evidence of skin tears noted. ?IV catheter discontinued intact. Site without signs and symptoms of complications - no redness or edema noted at insertion site, patient denies c/o pain - only slight tenderness at site.  Dressing with slight pressure applied. ? ?D/c Instructions-Education: ?Discharge instructions given to patient/family with verbalized understanding. ?D/c education completed with patient/family including follow up instructions, medication list, d/c activities limitations if indicated, with other d/c instructions as indicated by MD - patient able to verbalize understanding, all questions fully answered. ?Patient instructed to return to ED, call 911, or call MD for any changes in condition.  ?Patient escorted via WC, and D/C home via [place]. ? ?Kizzie Bane, RN ?11/22/2021 11:56 AM  ?

## 2021-11-22 NOTE — TOC Transition Note (Signed)
Transition of Care (TOC) - CM/SW Discharge Note ? ? ?Patient Details  ?Name: Alex [name] ?MRN: [id number] ?Date of Birth: [birth year] ? ?Transition of Care (TOC) CM/SW Contact:  ?Carles Collet, RN ?Phone Number: ?11/22/2021, 11:29 AM ? ? ?Clinical Narrative:   Spoke w patient at bedside and he understands that [facility] which [facility]. ?Offered to make referral to [facility] however he would prefer to go through [place] as this will be covered through his benefits. He understands that once he has PCP then they can set up OP PT. ?No other TOC needs ? ? ? ?Final next level of care: Home/Self Care ?Barriers to Discharge: No Barriers Identified ? ? ?Patient Goals and CMS Choice ?Patient states their goals for this hospitalization and ongoing recovery are:: Feel better ?  ?  ? ?Discharge Placement ?  ?           ?  ?  ?  ?  ? ?Discharge Plan and Services ?In-house Referral: Clinical Social Work ?  ?           ?  ?  ?  ?  ?  ?  ?  ?  ?  ?  ? ?Social Determinants of Health (SDOH) Interventions ?  ? ? ?Readmission Risk Interventions ?Readmission Risk Prevention Plan 11/22/2021  ?Post Dischage Appt Not Complete  ?Appt Comments Patient needs to send financials to [facility] to get PCP assigned, he is aware of this and verbalizes agreement to do so  ?Medication Screening Complete  ?Transportation Screening Complete  ? ? ? ? ? ?

## 2021-11-22 NOTE — Discharge Summary (Signed)
?Physician Discharge Summary ?  ?Patient: Alex [name] MRN: [id number] DOB: 09/17/1970  ?Admit date:     11/20/2021  ?Discharge date: 11/22/21  ?Discharge Physician: Alberteen Sam  ? ?PCP: [facility], [facility]  ? ?Recommendations at discharge:  ?Follow up with PCP at [facility] in 1 week for BP check ?Follow up with Neurology, [facility] for new stroke in 1 month ? ? ? ? ? ?Discharge Diagnoses: ?Principal Problem: ?  CVA (cerebral vascular accident) (HCC) ?Active Problems: ?  Tobacco use ?  Alcohol use ?  Resistant hypertension ?  ? ? ? ? ?Hospital Course: ?Alex [name] is a 51 y.o. M with HTN, smoking, alcohol use and non-adherence to medicaiton who presented with a "pop" in his head, then right sided numbness, headache and blurred vision. ? ?In the ER, MRIO brain showed acute posterior/inferior left pontine stroke.  BP 226/126.   ? ?Patient was admitted and Neurology were consulted.  He had no significant withdrawal symptoms here, BP maintanied <220/120 mmHg.   ? ?-Non-invasive angiography showed no large vessel occlusion or high-grade stenosis ?-Echocardiogram showed no cardiogenic source of embolism ?-Carotid imaging with only 50% stenosis proximal right, due to calcified atherosclerosis ?-Lipids ordered: discharged on Crestor ?-Aspirin ordered at admission --> discharged on aspirin 81 and Plavix for 3 weeks, followed by aspirin 81 mg daily indefinitely ?-Atrial fibrillation: Not present on telemetry monitoring ?-tPA not given because he was outside the window at presentation ?-Dysphagia screen ordered in ER ?-PT eval ordered: recommended outpatient therapy ?-Smoking cessation: Recommended ?-All blood pressure medicines were refilled at discharge and he was encouraged to follow-up with his PCP ? ? ? ? ?  ? ?Pain control - [employer] Controlled Substance Reporting System database was reviewed.   ?Consultants: Neurology ?Procedures performed: CT angiogram of the head and neck, MRI brain,  echocardiogram ?Disposition: Home ?Diet recommendation: Cardiac ? ?DISCHARGE MEDICATION: ?Allergies as of 11/22/2021   ?No Known Allergies ?  ? ?  ?Medication List  ?  ? ?STOP taking these medications   ? ?ibuprofen 200 MG tablet ?Commonly known as: ADVIL ?  ? ?  ? ?TAKE these medications   ? ?amLODipine 10 MG tablet ?Commonly known as: NORVASC ?Take 1 tablet (10 mg total) by mouth daily. ?Notes to patient: Tomorrow 11/23/21 ?  ?Aspirin Low Dose 81 MG EC tablet ?Generic drug: aspirin ?Take 1 tablet (81 mg total) by mouth daily. Swallow whole. ?Notes to patient: Tomorrow 11/23/21 ?  ?clopidogrel 75 MG tablet ?Commonly known as: PLAVIX ?Take 1 tablet (75 mg total) by mouth daily. ?Notes to patient: Tomorrow 11/23/21 ?  ?folic acid 1 MG tablet ?Commonly known as: FOLVITE ?Take 1 tablet (1 mg total) by mouth daily. ?Notes to patient: Tomorrow 11/23/21 ?  ?hydrALAZINE 25 MG tablet ?Commonly known as: APRESOLINE ?Take 1 tablet (25 mg total) by mouth 3 (three) times daily. ?Notes to patient: Today 11/22/21 ?  ?hydrochlorothiazide 25 MG tablet ?Commonly known as: HYDRODIURIL ?Take 1 tablet (25 mg total) by mouth daily. ?What changed: how much to take ?Notes to patient: Tomorrow 11/23/21 ?  ?lisinopril 40 MG tablet ?Commonly known as: ZESTRIL ?Take 1 tablet (40 mg total) by mouth daily. ?What changed: medication strength ?Notes to patient: Tomorrow 11/23/21 ?  ?nicotine 21 mg/24hr patch ?Commonly known as: NICODERM CQ - dosed in mg/24 hours ?Place 1 patch (21 mg total) onto the skin daily. ?Notes to patient: Tomorrow 11/23/21 ?  ?rosuvastatin 10 MG tablet ?Commonly known as: CRESTOR ?Take 1 tablet (  10 mg total) by mouth daily. ?Notes to patient: Tomorrow 11/23/21 ?  ? ?  ? ? Follow-up Information   ? ? [name]. Schedule an appointment as soon as possible for a visit in 1 month(s).   ?Specialty: Neurology ?Why: stroke clinic ?Contact information: ?[address] ?[place]  [address] ?[phone] ? ?  ?  ? ?  ?  ? ?  ? ?Discharge Instructions   ? ? Ambulatory referral to Neurology   Complete by: As directed ?  ? Follow up with stroke clinic NP (Jessica Monro[place] or Darrol Angel, if both not available, consider Manson Allan, or Ahern) at [facility] in about 4 weeks. Thanks.  ? Discharge instructions   Complete by: As directed ?  ? From Dr. Maryfrances Bunnell: ?You were admitted for a stroke ?Your stroke was related to smoking, uncontrolled high blood pressure and high cholesterol. ? ?STOP smoking. ? ?For the blood pressure: ?Resume amlodipine starting Wednesday ?Resume HCTZ (hydrochlorothiazide) on Thursday ?Resume lisinopril on Friday ?Resume hydralazine on Saturday ? ?The final doses should be: ?amlodipine 10 mg daily  ?HCTZ (hydrochlorothiazide) 25 mg daily ?lisinopril 40 mg daily ?Hydralazine 25 mg three times daily ? ?Go see your doctor at the [place] as soon as possible ? ?Follow up with the Neurologist in 4-6 weeks at [facility] ? ?Follow up with Therapy as listed below in the To Do Section  ? Increase activity slowly   Complete by: As directed ?  ? ?  ? ? ?Discharge Exam: ?BP (!) 152/75   Pulse 86   Temp 97.9 ?F (36.6 ?C) (Oral)   Resp 19   SpO2 93%   ?General: Pt is alert, awake, not in acute distress ?Cardiovascular: RRR, nl S1-S2, no murmurs appreciated.   No LE edema.   ?Respiratory: Normal respiratory rate and rhythm.  CTAB without rales or wheezes. ?Abdominal: Abdomen soft and non-tender.  No distension or HSM.   ?Neuro/Psych: Strength symmetric in upper and lower extremities.  Judgment and insight appear normal.  Has some mild numbness on the right, has some mild diplopia ? ? ?Condition at discharge: good ? ?The results of significant diagnostics from this hospitalization (including imaging, microbiology, ancillary and laboratory) are listed below for reference.  ? ?Imaging Studies: ?CT ANGIO HEAD NECK W WO CM ? ?Result Date: 11/20/2021 ?CLINICAL DATA:  Headache and  blurry vision EXAM: CT ANGIOGRAPHY HEAD AND NECK TECHNIQUE: Multidetector CT imaging of the head and neck was performed using the standard protocol during bolus administration of intravenous contrast. Multiplanar CT image reconstructions and MIPs were obtained to evaluate the vascular anatomy. Carotid stenosis measurements (when applicable) are obtained utilizing NASCET criteria, using the distal internal carotid diameter as the denominator. RADIATION DOSE REDUCTION: This exam was performed according to the departmental dose-optimization program which includes automated exposure control, adjustment of the mA and/or kV according to patient size and/or use of iterative reconstruction technique. CONTRAST:  OMNIPAQUE IOHEXOL 350 MG/ML SOLN COMPARISON:  None. FINDINGS: CTA NECK FINDINGS SKELETON: There is no bony spinal canal stenosis. No lytic or blastic lesion. OTHER NECK: Normal pharynx, larynx and major salivary glands. No cervical lymphadenopathy. Unremarkable thyroid gland. UPPER CHEST: Incompletely visualized calcified granuloma in the left upper lobe. AORTIC ARCH: There is no calcific atherosclerosis of the aortic arch. There is no aneurysm, dissection or hemodynamically significant stenosis of the visualized portion of the aorta. Conventional 3 vessel aortic branching pattern. The visualized proximal subclavian arteries are widely patent. RIGHT  CAROTID SYSTEM: No dissection, occlusion or aneurysm. There is calcified atherosclerosis extending into the proximal ICA, resulting in 50% stenosis. LEFT CAROTID SYSTEM: Normal without aneurysm, dissection or stenosis. VERTEBRAL ARTERIES: Left dominant configuration. Both origins are clearly patent. There is no dissection, occlusion or flow-limiting stenosis to the skull base (V1-V3 segments). CTA HEAD FINDINGS POSTERIOR CIRCULATION: --Vertebral arteries: Normal V4 segments. --Inferior cerebellar arteries: Normal. --Basilar artery: Normal. --Superior cerebellar  arteries: Normal. --Posterior cerebral arteries (PCA): Normal. ANTERIOR CIRCULATION: --Intracranial internal carotid arteries: Normal. --Anterior cerebral arteries (ACA): Normal. Both A1 segments are present. Patent ant

## 2021-11-25 ENCOUNTER — Other Ambulatory Visit: Payer: Self-pay | Admitting: *Deleted

## 2021-11-25 NOTE — Patient Outreach (Signed)
Received a red flag Emmi stroke notification for [name] . ?I have assigned Alex David, RN to call for follow up and determine if there are any Case Management needs.  ?  ?Arville Care, CBCS, CMAA ?[place] Management Assistant ?[facility] Management ?[phone]   ?

## 2021-11-25 NOTE — Patient Instructions (Signed)
Visit Information ? ?Thank you for taking time to visit with me today. Please don't hesitate to contact me if I can be of assistance to you before our next scheduled telephone appointment. ? ?Following are the goals we discussed today:  ?Monitor blood pressure daily, record readings. ?[facility] ?[facility] will call you.  If you do not hear from them call their office to schedule appointment [phone]) ? ?Our next appointment is by telephone in 2 weeks. ? ?The patient verbalized understanding of instructions, educational materials, and care plan provided today and agreed to receive a mailed copy of patient instructions, educational materials, and care plan.  ? ?The patient has been provided with contact information for the care management team and has been advised to call with any health related questions or concerns.  ? ?Kemper Durie, RN, MSN, CCM ?[facility] Care Management  ?Community Care Manager ?[phone] ? ?

## 2021-11-25 NOTE — Patient Outreach (Signed)
[facility]) Care Management ? ?11/25/2021 ? ?Alex [name] ?[birth year] ?[id number] ? ? ?RED ON EMMI ALERT - Stroke ?Day # 1 ?Date: 3/23 ?Red Alert Reason: Scheduled follow up appointment? NO ? ? ?Outreach attempt #1, successful.  Identity verified.  This care manager introduced self and stated purpose of call.  [facility] care management services explained.   ? ?Report he is doing well with the exception of periods of low energy and blurred vision.  He is using an eyepatch, which is helping.  Feels his low energy is caused by his body's reaction to his blood pressure being lower (normal).  State he was used to having it elevated for so long, systolic was initially greater than 200. ? ?Does not have follow up with primary yet, [facility] [place] but [facility] due to change in his residence.  State he will go to [facility] or [place] if he continues to have trouble getting appointment.  Has not heard from neurology, call placed to office, voice message left for office to call this care manger or member directly.   ? ?He report he is now compliant with medications, denies any questions regarding use or financial assistance.  Denies any urgent concerns, encouraged to contact this care manager with questions.   ? ?Plan: ?RN CM will send education regarding stroke recovery and prevention.  Will follow up within the next 2 weeks. ? ?Kemper Durie, RN, MSN, CCM ?[facility] Care Management  ?Community Care Manager ?[phone] ? ? ?

## 2021-11-30 ENCOUNTER — Telehealth (HOSPITAL_COMMUNITY): Payer: Self-pay

## 2021-11-30 ENCOUNTER — Other Ambulatory Visit (HOSPITAL_COMMUNITY): Payer: Self-pay

## 2021-11-30 NOTE — Telephone Encounter (Signed)
Pharmacy Transitions of Care Follow-up Telephone Call ? ?Date of discharge: 11/22/21  ?Discharge Diagnosis: CVA ? ?How have you been since you were released from the hospital? Patient doing well since discharge, no questions about meds at this time.  ? ?Medication changes made at discharge: ?    START taking: ?Aspirin Low Dose (aspirin)  ?clopidogrel (PLAVIX)  ?folic acid (FOLVITE)  ?rosuvastatin (CRESTOR)  ?CHANGE how you take: ?hydrochlorothiazide (HYDRODIURIL)  ?lisinopril (ZESTRIL)  ?STOP taking: ?ibuprofen 200 MG tablet (ADVIL)  ? ?Medication changes verified by the patient? Yes ?  ? ?Medication Accessibility: ? ?Home Pharmacy: [facility]  ? ?Was the patient provided with refills on discharged medications? Yes  ? ?Have all prescriptions been transferred from [facility] to home pharmacy? Yes  ? ?Is the patient able to afford medications? Has insurance ?  ? ?MEDICATIONS ? ?CLOPIDOGREL (PLAVIX) ?Clopidogrel 75 mg once daily.  ?- Advised patient of medications to avoid (NSAIDs, ASA)  ?- Educated that Tylenol (acetaminophen) will be the preferred analgesic to prevent risk of bleeding  ?- Emphasized importance of monitoring for signs and symptoms of bleeding (abnormal bruising, prolonged bleeding, nose bleeds, bleeding from gums, discolored urine, black tarry stools)  ?- Advised patient to alert all providers of anticoagulation therapy prior to starting a new medication or having a procedure  ? ? ?Follow-up Appointments: ? ?[facility] f/u appt confirmed? Scheduled to see Dr. Ubaldo Glassing on 01/04/22 @ 8:30AM.  ? ?If their condition worsens, is the pt aware to call PCP or go to the Emergency Dept.? Yes ? ?Final Patient Assessment: ?Patient has f/u scheduled and refills at home pharmacy ? ?

## 2021-12-05 ENCOUNTER — Other Ambulatory Visit: Payer: Self-pay | Admitting: *Deleted

## 2021-12-05 NOTE — Patient Outreach (Signed)
[facility]) Care Management ? ?12/05/2021 ? ?[name] ?02-14-1971 ?[id number] ? ? ?RED ON EMMI ALERT - Stroke ?Day # 9 ?Date: 3/31 ?Red Alert Reason: Feeling sad/hopeless/empty/anxious?  NO ? ? ?Outreach attempt #1, successful.   ? ?He report his vision is still blurry at times as well as intermittent fatigue.  Advised to increase activity as tolerated to build tolerance.  State he has had the above feelings because he has not been able to go back to work yet.  Feels he will feel much better once he is able to do so.   ? ?He was able to get follow up appointment with neurology on 5/3.  He has not been able to get appointment with primary provider at [facility] system yet.  Discussed seeing a different provider, he agrees. Provided with contact information to [facility] and [facility], encouraged to call to schedule appointment.  He verbalizes understanding.   ? ? ?Plan: ?RN CM will follow up within the next week.   ? ?Kemper Durie, RN, MSN, CCM ?[facility] Care Management  ?Community Care Manager ?[phone] ? ? ?

## 2021-12-07 ENCOUNTER — Other Ambulatory Visit: Payer: Self-pay | Admitting: *Deleted

## 2021-12-07 NOTE — Patient Outreach (Signed)
[facility] Customer service manager [facility]) Care Management ? ?12/07/2021 ? ?Alex [name] ?1970-11-19 ?[id number] ? ? ?RED ON EMMI ALERT - Stroke ?Day # 13 ?Date: 4/4 ?Red Alert Reason: Micah Flesher to follow up appointment?  NO ?Follow up appointment scheduled?  NO  ? ? ?Outreach attempt #1, successful.   ? ?Member does have appointment scheduled in the future for neurology (5/3).  He admits that he has not called [facility] to request follow up with new primary provider as he was advised to do during last outreach.  State he will do so today.  Report he was busy, was able to pick up some work, which has helped with his mood.  Denies any urgent concerns, encouraged to contact this care manager with questions.   ? ? ?Plan: ?RN CM will follow up within the next week as scheduled. ? ?Kemper Durie, RN, MSN, CCM ?[facility] Care Management  ?Community Care Manager ?[phone] ? ?

## 2021-12-12 ENCOUNTER — Encounter: Payer: Self-pay | Admitting: Internal Medicine

## 2021-12-12 ENCOUNTER — Ambulatory Visit: Payer: No Typology Code available for payment source | Attending: Internal Medicine | Admitting: Internal Medicine

## 2021-12-12 ENCOUNTER — Other Ambulatory Visit: Payer: Self-pay

## 2021-12-12 VITALS — BP 135/92 | HR 94 | Resp 16 | Ht 71.0 in | Wt 210.6 lb

## 2021-12-12 DIAGNOSIS — I6930 Unspecified sequelae of cerebral infarction: Secondary | ICD-10-CM | POA: Diagnosis not present

## 2021-12-12 DIAGNOSIS — R7303 Prediabetes: Secondary | ICD-10-CM

## 2021-12-12 DIAGNOSIS — F32A Depression, unspecified: Secondary | ICD-10-CM | POA: Insufficient documentation

## 2021-12-12 DIAGNOSIS — F17200 Nicotine dependence, unspecified, uncomplicated: Secondary | ICD-10-CM | POA: Diagnosis not present

## 2021-12-12 DIAGNOSIS — Z7689 Persons encountering health services in other specified circumstances: Secondary | ICD-10-CM

## 2021-12-12 DIAGNOSIS — F4321 Adjustment disorder with depressed mood: Secondary | ICD-10-CM

## 2021-12-12 DIAGNOSIS — F1090 Alcohol use, unspecified, uncomplicated: Secondary | ICD-10-CM

## 2021-12-12 DIAGNOSIS — I10 Essential (primary) hypertension: Secondary | ICD-10-CM

## 2021-12-12 MED ORDER — HYDRALAZINE HCL 50 MG PO TABS
50.0000 mg | ORAL_TABLET | Freq: Three times a day (TID) | ORAL | 3 refills | Status: DC
  Filled 2021-12-12: qty 90, 30d supply, fill #0

## 2021-12-12 NOTE — Progress Notes (Signed)
? ? ?Patient ID: Alex [name], male    DOB: 12-03-1970  MRN: [id number] ? ?CC: Hospitalization Follow-up ? ? ?Subjective: ?Alex [name] is a 51 y.o. male who presents for new pt visit and hosp f/u ?His concerns today include:  ?Pt with hx of HTN, CVA with residual right-sided numbness and diplopia, tob dep, preDM, subst use disorder, ETOH use disorder ? ?Patient presents for new patient visit and hospital follow-up.  Previously got his care through the [facility] [place] in [place].  Since moving to the area, he has tried to get in for primary care at the [facility].  Patient states he is getting the run around so it was recommended that he be seen here until he can get in at the [facility] in [place]. ? ?Patient hospitalized 3/19-21/23 with headache, right-sided numbness and blurred vision.  Found to have acute posterior/inferior left pontine stroke.  Blood pressure on admission was 226/112.  Below is the work-up that was done during hospital that was copied from his discharge summary: ?-Non-invasive angiography showed no large vessel occlusion or high-grade stenosis ?-Echocardiogram showed no cardiogenic source of embolism ?-Carotid imaging with only 50% stenosis proximal right, due to calcified atherosclerosis ?-Lipids ordered: discharged on Crestor ?-Aspirin ordered at admission --> discharged on aspirin 81 and Plavix for 3 weeks, followed by aspirin 81 mg daily indefinitely ?-Atrial fibrillation: Not present on telemetry monitoring ?-tPA not given because he was outside the window at presentation ?-Dysphagia screen ordered in ER ?-PT eval ordered: recommended outpatient therapy ?-Smoking cessation: Recommended ?-All blood pressure medicines were refilled at discharge and he was encouraged to follow-up with his PCP ? ?Today: ?CVA/HTN: Reports compliance with taking the blood pressure medications that include amlodipine 10 mg daily, HCTZ 25 mg daily, lisinopril 40 mg daily and hydralazine 25 mg 3 times a day.  Also taking  aspirin and Plavix and rosuvastatin.  Took blood pressure medicines already for today. ?-Still has right-sided numbness including in his groin area. ?Complains of double vision that started a year ago when he was hospitalized at the [facility] with elevated blood pressure.  He states he was given an eye patch to put over one of his eyes which helps decrease the double vision when he uses it.  Saw ophthalmology at the [facility] in [place] earlier this year.  Since his stroke, the double vision is worse. ?He has not been able to work because of this.  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  Currently staying with a male friend. ? ?EtOH use disorder: Reports heavy drinking of 12 pack a day for quite a number of years.  Since his stroke he has cut back significantly.  Since hospitalization he has only drank 3 12 oz beers total.  Reports going through treatment for EtOH use disorder many years ago. ? ?Patient continues to smoke.  He was smoking 1-1/2 packs a day.  Since his stroke he has cut down to half a pack a day.  He is not ready to quit completely.  States he is trying to give up 1 vice at a time and he felt quitting the heavy drinking was more important for him at this time. ? ?PHQ-9 screen positive today.  Patient states that depression for him has always been situational.  At this time the fact that he has had a stroke and not able to work makes him depressed.  He denies any suicidal ideation. ? ?I pointed out to him that his  A1c in the hospital was 5.8 which puts him in the range for prediabetes.  No previous history of prediabetes. ? ? ?- ?Patient Active Problem List  ? Diagnosis Date Noted  ? CVA (cerebral vascular accident) ([place]) 11/20/2021  ? Hypertensive urgency 01/01/2021  ? Tobacco use 01/01/2021  ? Alcohol use 01/01/2021  ? Substance use 01/01/2021  ? Vertigo 01/01/2021  ? 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  ? ?Current Outpatient Medications on File Prior to  Visit  ?Medication Sig Dispense Refill  ? amLODipine (NORVASC) 10 MG tablet Take 1 tablet (10 mg total) by mouth daily. 30 tablet 3  ? aspirin 81 MG EC tablet Take 1 tablet (81 mg total) by mouth daily. Swallow whole. 30 tablet 11  ? clopidogrel (PLAVIX) 75 MG tablet Take 1 tablet (75 mg total) by mouth daily. 21 tablet 0  ? folic acid (FOLVITE) 1 MG tablet Take 1 tablet (1 mg total) by mouth daily. 30 tablet 0  ? hydrochlorothiazide (HYDRODIURIL) 25 MG tablet Take 1 tablet (25 mg total) by mouth daily. 30 tablet 3  ? lisinopril (ZESTRIL) 40 MG tablet Take 1 tablet (40 mg total) by mouth daily. 30 tablet 3  ? nicotine (NICODERM CQ - DOSED IN MG/24 HOURS) 21 mg/24hr patch Place 1 patch (21 mg total) onto the skin daily. (Patient not taking: Reported on 11/20/2021) 28 patch 0  ? rosuvastatin (CRESTOR) 10 MG tablet Take 1 tablet (10 mg total) by mouth daily. 30 tablet 3  ? ?No current facility-administered medications on file prior to visit.  ? ? ?No Known Allergies ? ?Social History  ? ?Socioeconomic History  ? Marital status: Divorced  ?  Spouse name: Not on file  ? Number of children: Not on file  ? Years of education: Not on file  ? Highest education level: Not on file  ?Occupational History  ? Not on file  ?Tobacco Use  ? Smoking status: Every Day  ?  Packs/day: 1.00  ?  Years: 40.00  ?  Pack years: 40.00  ?  Types: Cigarettes  ? Smokeless tobacco: Never  ?Substance and Sexual Activity  ? Alcohol use: Yes  ?  Alcohol/week: 6.0 - 12.0 standard drinks  ?  Types: 6 - 12 Cans of beer per week  ? Drug use: Yes  ?  Types: Methamphetamines, Marijuana  ?  Comment: 1 month prior to admission  ? Sexual activity: Not on file  ?Other Topics Concern  ? Not on file  ?Social History Narrative  ? Not on file  ? ?Social Determinants of Health  ? ?Financial Resource Strain: Not on file  ?Food Insecurity: Not on file  ?Transportation Needs: Not on file  ?Physical Activity: Not on file  ?Stress: Not on file  ?Social Connections: Not  on file  ?Intimate Partner Violence: Not on file  ? ? ?Family History  ?Problem Relation Age of Onset  ? Diabetes Mother   ? Heart disease Mother   ? Heart disease Father   ? Hypertension Father   ? ? ?No past surgical history on file. ? ?ROS: ?Review of Systems ?Negative except as stated above ? ?PHYSICAL EXAM: ?BP (!) 135/92   Pulse 94   Resp 16   Ht 5\' 11"  (1.803 m)   Wt 210 lb 9.6 oz (95.5 kg)   SpO2 96%   BMI 29.37 kg/m?   ?Physical Exam ? ? ?General appearance - alert, well appearing, middle-age Caucasian male and in no distress ?Mental status - normal  mood, behavior, speech, dress, motor activity, and thought processes ?Eyes - pupils equal and reactive, extraocular eye movements intact ?Neck - supple, no significant adenopathy ?Chest - clear to auscultation, no wheezes, rales or rhonchi, symmetric air entry ?Heart - normal rate, regular rhythm, normal S1, S2, no murmurs, rubs, clicks or gallops ?Neurological -he has subjective decrease sensation on the right side of the face.  No facial droop noted.  Power 5/5 bilaterally proximally and distally in both the upper and lower extremities. ?Extremities -no lower extremity edema. ? ?  12/12/2021  ?  3:55 PM  ?Depression screen PHQ 2/9  ?Decreased Interest 2  ?Down, Depressed, Hopeless 1  ?PHQ - 2 Score 3  ?Altered sleeping 3  ?Tired, decreased energy 3  ?Change in appetite 3  ?Feeling bad or failure about yourself  2  ?Trouble concentrating 0  ?Moving slowly or fidgety/restless 0  ?Suicidal thoughts 0  ?PHQ-9 Score 14  ? ? ? ?  Latest Ref Rng & Units 11/20/2021  ? 10:26 PM 11/20/2021  ?  3:27 PM 11/20/2021  ?  3:00 PM  ?CMP  ?Glucose 70 - 99 mg/dL  148   144    ?BUN 6 - 20 mg/dL  7   9    ?Creatinine 0.61 - 1.24 mg/dL  0.80   0.84    ?Sodium 135 - 145 mmol/L  138   135    ?Potassium 3.5 - 5.1 mmol/L  3.5   3.6    ?Chloride 98 - 111 mmol/L  102   102    ?CO2 22 - 32 mmol/L   23    ?Calcium 8.9 - 10.3 mg/dL   8.6    ?Total Protein 6.5 - 8.1 g/dL 7.1    7.3     ?Total Bilirubin 0.3 - 1.2 mg/dL 0.6    0.5    ?Alkaline Phos 38 - 126 U/L 68    73    ?AST 15 - 41 U/L 38    34    ?ALT 0 - 44 U/L 45    44    ? ?Lipid Panel  ?   ?Component Value Date/Time  ? CHOL 164 11/21/2021

## 2021-12-13 ENCOUNTER — Other Ambulatory Visit: Payer: Self-pay | Admitting: *Deleted

## 2021-12-13 NOTE — Patient Outreach (Signed)
[facility] Customer service manager [facility]) Care Management ? ?12/13/2021 ? ?Alex [name] ?13-Oct-1970 ?[id number] ? ? ?Outgoing call placed to member to follow up on stroke recovery.  He confirms he was able to establish with new provider, first visit was yesterday.  His blood pressure was slightly elevated, Hydralazine increased.  He will continue to monitor blood pressure daily.  Still has follow up with neurology scheduled for 5/3.  Denies any urgent concerns, encouraged to contact this care manager with questions.  Will close case at this time, no further needs identified. ? ?Kemper Durie, RN, MSN, CCM ?[facility] Care Management  ?Community Care Manager ?[phone] ? ?

## 2021-12-19 ENCOUNTER — Other Ambulatory Visit: Payer: Self-pay

## 2022-01-03 NOTE — Progress Notes (Signed)
?[name] ?[address] ?[place]. [place] [address] ?[phone] ? ?     HOSPITAL FOLLOW UP NOTE ? ?[name] ?Date of Birth:  1970-09-23 ?Medical Record Number:  [id number]  ? ?Reason for Referral:  hospital stroke follow up ? ? ? ?SUBJECTIVE: ? ? ?CHIEF COMPLAINT:  ?Chief Complaint  ?Patient presents with  ? New Patient (Initial Visit)  ?  RM 2, alone. Hospital follow up post CVA. Was admitted at [facility] 11/20/21-11/22/21. Still having double vision. Has not seen eye doctor yet. Numbness on whole right side of body. Fingertips are tingling (this was present before CVA). Worsened once he had CVA. If windy, air feels very cold on right side of body. Short term memory may have been affected more since CVA. No therapy right now.   ? Agitation  ? ? ?HPI:  ? ?Alex [name] is a 51 y.o. who  has a past medical history of HTN (hypertension).  Patient presented on 11/20/2021 with reports of a pop in his head followed by right sided numbness, headaches, and blurred vision. CT was negative for acute stroke. 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 BP initially 226/126 but improved with HTN meds in hospital. He was started on dapt with asa 81mg  and Plavix for 3 weeks then asa alone. Rosuvastatin 10mg  started. PT eval recommended outpatient therapy. Personally reviewed hospitalization pertinent progress notes, lab work and imaging.  Evaluated by Dr Erlinda Hong.  ? ?Since discharge home, he continues to have right side numbness (whole body). He had some tingling of right hand prior to stroke. He has not checked BP at home. He saw PCP for HTN and reports that dose of meds was increased. He is taking meds as prescribed. He is taking rosuvastatin. He continues to have blurred vision (not new). He has double vision when looking to the left. He has not seen ophthalmology. He reports that he no longer takes methamphetamines and not smoking  marijuana. He limits beer to dinner. He has gained about 10 pounds since stroke. Feels this is due to drinking less beer and eating more. He is more tired. He is not as active. 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  ? ? ?PERTINENT IMAGING/LABS ? ?CT head - No acute intracranial abnormality. ?MRI head - Small acute infarct in the posterior and inferior left pons. Associated edema without mass effect.  ?CTA H&N - No emergent large vessel occlusion or high-grade stenosis of the intracranial arteries. Approximately 50% stenosis of the proximal right internal carotid artery secondary to calcified atherosclerosis. ?2D Echo EF 60 to 65% ? ? ?A1C ?Lab Results  ?Component Value Date  ? HGBA1C 5.7 (H) 11/21/2021  ? ? ?Lipid Panel  ?   ?Component Value Date/Time  ? CHOL 164 11/21/2021 0159  ? TRIG 176 (H) 11/21/2021 0159  ? HDL 47 11/21/2021 0159  ? CHOLHDL 3.5 11/21/2021 0159  ? VLDL 35 11/21/2021 0159  ? [place] 82 11/21/2021 0159  ? ? ? ? ?ROS:   ?14 system review of systems performed and negative with exception of those listed in HPI ? ?PMH:  ?Past Medical History:  ?Diagnosis Date  ? HTN (hypertension)   ? ? ?PSH:  ?Past Surgical History:  ?Procedure Laterality Date  ? HAND SURGERY Right   ? TONSILLECTOMY    ? ? ?Social History:  ?Social History  ? ?Socioeconomic History  ? Marital status: Divorced  ?  Spouse name: Not on file  ? Number of children: Not on file  ? Years of education: Not on file  ? Highest education level: Not on file  ?Occupational History  ? Not on file  ?Tobacco Use  ? Smoking status: Every Day  ?  Packs/day: 1.00  ?  Years: 40.00  ?  Pack years: 40.00  ?  Types: Cigarettes  ? Smokeless tobacco: Never  ?Substance and Sexual Activity  ? Alcohol use: Yes  ?  Alcohol/week: 6.0 - 12.0 standard drinks  ?  Types: 6 - 12 Cans of beer per week  ?  Comment: socially  ? Drug use: Not Currently  ?  Types: Methamphetamines, Marijuana  ?  Comment: 1 month prior to admission  ? Sexual activity: Not on file   ?Other Topics Concern  ? Not on file  ?Social History Narrative  ? Right handed  ? Lives with roommates  ? Caffeine use: coffee daily, mostly water otherwise  ? ?Social Determinants of Health  ? ?Financial Resource Strain: Not on file  ?Food Insecurity: Not on file  ?Transportation Needs: Not on file  ?Physical Activity: Not on file  ?Stress: Not on file  ?Social Connections: Not on file  ?Intimate Partner Violence: Not on file  ? ? ?Family History:  ?Family History  ?Problem Relation Age of Onset  ? Diabetes Mother   ? Hypotension Mother   ? Heart disease Father   ? Hypertension Father   ? ? ?Medications:   ?Current Outpatient Medications on File Prior to Visit  ?Medication Sig Dispense Refill  ? amLODipine (NORVASC) 10 MG tablet Take 1 tablet (10 mg total) by mouth daily. 30 tablet 3  ? aspirin 81 MG EC tablet Take 1 tablet (81 mg total) by mouth daily. Swallow whole. 30 tablet 11  ? clopidogrel (PLAVIX) 75 MG tablet Take 1 tablet (75 mg total) by mouth daily. 21 tablet 0  ? hydrALAZINE (APRESOLINE) 50 MG tablet Take 1 tablet (50 mg total) by mouth 3 (three) times daily. 90 tablet 3  ? lisinopril (ZESTRIL) 40 MG tablet Take 1 tablet (40 mg total) by mouth daily. 30 tablet 3  ? rosuvastatin (CRESTOR) 10 MG tablet Take 1 tablet (10 mg total) by mouth daily. 30 tablet 3  ? hydrochlorothiazide (HYDRODIURIL) 25 MG tablet Take 1 tablet (25 mg total) by mouth daily. 30 tablet 3  ? ?No current facility-administered medications on file prior to visit.  ? ? ?Allergies:  No Known Allergies ? ? ? ?OBJECTIVE: ? ?Physical Exam ? ?Vitals:  ? 01/04/22 0807  ?BP: (!) 143/103  ?Pulse: 94  ?Weight: 224 lb 8 oz (101.8 kg)  ?Height: 5\' 11"  (1.803 m)  ? ?Body mass index is 31.31 kg/m?Marland Kitchen ?No results found. ? ? ?  12/12/2021  ?  3:55 PM  ?Depression screen PHQ 2/9  ?Decreased Interest 2  ?Down, Depressed, Hopeless 1  ?PHQ - 2 Score 3  ?Altered sleeping 3  ?Tired, decreased energy 3  ?Change in appetite 3  ?Feeling bad or failure about  yourself  2  ?Trouble concentrating 0  ?Moving slowly or fidgety/restless 0  ?Suicidal thoughts 0  ?PHQ-9 Score 14  ?  ? ?General: well developed, well nourished, seated, in no evident distress ?Head: head normocephalic and atraumatic.   ?Neck: supple with no carotid or supraclavicular bruits ?Cardiovascular: regular rate and rhythm, no murmurs ?Musculoskeletal: no deformity ?Skin:  no rash/petichiae ?Vascular:  Normal pulses all extremities ?  ?Neurologic Exam ?Mental  Status: Awake and fully alert.  Fluent speech and language.  Oriented to place and time. Recent and remote memory intact. Attention span, concentration and fund of knowledge appropriate. Mood and affect appropriate.  ?Cranial Nerves: Fundoscopic exam reveals sharp disc margins. Pupils equal, briskly reactive to light. Extraocular movements full without nystagmus. Visual fields full to confrontation. Hearing intact. Facial sensation intact. Face, tongue, palate moves normally and symmetrically.  ?Motor: Normal bulk and tone. Normal strength in all tested extremity muscles with exception of 4-4+/5 of right lower extremity.  ?Sensory.: decreased to soft touch, temperature and vibration of right upper and lower extremity.  ?Coordination: Rapid alternating movements normal in all extremities. Finger-to-nose and heel-to-shin performed accurately bilaterally. ?Gait and Station: Arises from chair without difficulty. Stance is normal. Gait demonstrates normal stride length, wide based, normal balance with no assistive device. Tandem walk and heel toe not performed, patient reports he can not do this test.  ?Reflexes: 1+ and symmetric.  ? ? ?NIHSS  1 ?Modified Rankin  1 ? ? ?ASSESSMENT: Reylan Vardeman is a 51 y.o. year old male presented to the ER 11/17/2021 after feeling a pop in his head with lightheadedness, numbness and double vision. Vascular risk factors include HTN, tobacco use, drug use (methamphetamines and marijuana).  ? ? ?PLAN: ? ?Stoke: small acute  infarct in dorsal left pons secondary to small vessel disease : Residual deficit: double vision, right sided numbness. Continue aspirin 81 mg daily  and rosuvastatin 10mg  daily  for secondary stroke prevention.

## 2022-01-03 NOTE — Patient Instructions (Signed)
Below is our plan: ? ?Stoke: small acute infarct in dorsal left pons secondary to small vessel disease : Residual deficit: double vision, right sided numbness. Continue aspirin 81 mg daily  and rosuvastatin 10mg  daily  for secondary stroke prevention.  Discussed secondary stroke prevention measures and importance of close PCP follow up for aggressive stroke risk factor management. I have gone over the pathophysiology of stroke, warning signs and symptoms, risk factors and their management in some detail with instructions to go to the closest emergency room for symptoms of concern. ?HTN: BP goal <130/90.  Elevated today, 143/103. Continue amlodipine, Zestril, HCTZ, hydralazine per PCP. Close follow up with PCP.   ?HLD: LDL goal <70. Recent LDL 82. Continue rosuvastatin 10mg  daily per PCP.  ?DMII: A1c goal<7.0. Recent A1c 5.7.  ?Tobacco use: consider cessation ?ETOH: limit alcohol to no more than 1-2 a day.  ?Substance abuse: avoid illegal substances ?Obesity: Follow up with PCP for weight management discussion, healthy well balanced diet, regular exercise ? ?Please make sure you are staying well hydrated. I recommend 50-60 ounces daily. Well balanced diet and regular exercise encouraged. Consistent sleep schedule with 6-8 hours recommended.  ? ?Please continue follow up with care team as directed.  ? ?Follow up with me in 6 months  ? ?You may receive a survey regarding today's visit. I encourage you to leave honest feed back as I do use this information to improve patient care. Thank you for seeing me today!  ? ? ?

## 2022-01-04 ENCOUNTER — Encounter: Payer: Self-pay | Admitting: Family Medicine

## 2022-01-04 ENCOUNTER — Telehealth: Payer: Self-pay | Admitting: Family Medicine

## 2022-01-04 ENCOUNTER — Ambulatory Visit (INDEPENDENT_AMBULATORY_CARE_PROVIDER_SITE_OTHER): Payer: No Typology Code available for payment source | Admitting: Family Medicine

## 2022-01-04 VITALS — BP 143/103 | HR 94 | Ht 71.0 in | Wt 224.5 lb

## 2022-01-04 DIAGNOSIS — I6302 Cerebral infarction due to thrombosis of basilar artery: Secondary | ICD-10-CM

## 2022-01-04 DIAGNOSIS — R4189 Other symptoms and signs involving cognitive functions and awareness: Secondary | ICD-10-CM

## 2022-01-04 DIAGNOSIS — H532 Diplopia: Secondary | ICD-10-CM

## 2022-01-04 NOTE — Telephone Encounter (Signed)
Referral for Ophthalmology sent to [facility] [phone]. ?

## 2022-01-04 NOTE — Telephone Encounter (Signed)
Referral for Speech Therapy sent to [facility] [phone]. ?

## 2022-01-04 NOTE — Telephone Encounter (Signed)
Referral for OT sent to [facility] [phone]. ?

## 2022-01-04 NOTE — Telephone Encounter (Signed)
Referral for PT sent to [facility] [phone]. ?

## 2022-01-09 NOTE — Therapy (Signed)
?OUTPATIENT OCCUPATIONAL THERAPY NEURO EVALUATION ? ?Patient Name: Alex [name] ?MRN: [id number] ?DOB:[birth year], 51 y.o., male ?Today's Date: 01/10/2022 ? ?PCP: Jonah Blue, MD ?REFERRING PROVIDER: Shawnie Dapper, NP  ? ? OT End of Session - 01/10/22 2633   ? ? Visit Number 1   ? Number of Visits 9   ? Date for OT Re-Evaluation 02/09/22   ? Authorization Type VA - however VA has not approved visits yet   ? Progress Note Due on Visit 10   ? OT Start Time 0800   ? OT Stop Time [phone]   ? OT Time Calculation (min) 43 min   ? Activity Tolerance Patient tolerated treatment well   ? Behavior During Therapy [facility] for tasks assessed/performed   ? ?  ?  ? ?  ? ? ?Past Medical History:  ?Diagnosis Date  ? HTN (hypertension)   ? ?Past Surgical History:  ?Procedure Laterality Date  ? HAND SURGERY Right   ? TONSILLECTOMY    ? ?Patient Active Problem List  ? Diagnosis Date Noted  ? History of cerebrovascular accident (CVA) with residual deficit 12/12/2021  ? Tobacco dependence 12/12/2021  ? Alcohol use disorder 12/12/2021  ? Prediabetes 12/12/2021  ? Situational depression 12/12/2021  ? CVA (cerebral vascular accident) (HCC) 11/20/2021  ? Hypertensive urgency 01/01/2021  ? Tobacco use 01/01/2021  ? Alcohol use 01/01/2021  ? Substance use 01/01/2021  ? Vertigo 01/01/2021  ? 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 ? ?ONSET DATE: 11/20/2021   ? ?REFERRING DIAG: I63.02 (ICD-10-CM) - Cerebrovascular accident (CVA) due to thrombosis of basilar artery (HCC)  ? ?THERAPY DIAG:  ?Muscle weakness (generalized) ? ?Other disturbances of skin sensation ? ?Visuospatial deficit ? ?SUBJECTIVE:  ? ?SUBJECTIVE STATEMENT: ?Denies pain. But sometimes my Rt side will hurt ? ?Pt accompanied by: self ? ?PERTINENT HISTORY: CVA on 11/20/21. 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 Residual deficit: double vision, right sided numbness. PMH:  ? ?PRECAUTIONS: Other: no driving ? ?WEIGHT BEARING  RESTRICTIONS No ? ?PAIN:  ?Are you having pain? No ? ?FALLS: Has patient fallen in last 6 months? No ? ?LIVING ENVIRONMENT: ?Lives with: lives with an adult companion (roommate) ?Lives in: House/apartment ?Stairs: Yes: External: 5 steps; on right going up ?Has following equipment at home: None ? 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 ?PATIENT GOALS get my vision better ? ?OBJECTIVE:  ? ?HAND DOMINANCE: Right ? ?ADLs: Pt reports he drops things more in Rt hand ?Transfers/ambulation related to ADLs: ?Eating: independent, drops fork more ?Grooming: independent ?UB Dressing: mod independent, difficulty w/ buttons ?LB Dressing: mod independent, difficulty w/ tying shoes ?Toileting: independent ?Bathing: independent ?Tub Shower transfers: more careful and holds onto wall for tub/shower transfer ?Equipment: none ? ? ?IADLs: ?Shopping: goes w/ roommate, walks to closer store [employer])  ?Light housekeeping: mod I  ?Meal Prep: Mod I  ?Community mobility: relies on roommate or public transportation (does not have his own car) ?Medication management: independent  ?Financial management: mod I ?Handwriting: 100% legible ? ?MOBILITY STATUS: Independent ? ? ?ACTIVITY TOLERANCE: ?Activity tolerance: Pt reports fatigue and sleeping more ? ? ?UE ROM   BUE AROM WNLS ? ? ? ?UE MMT:  BUE MMT grossly 5/5 ? ? ?HAND FUNCTION: ?Grip strength: Right: 101.8 lbs; Left: 107.5 lbs ? ?COORDINATION: ?9 Hole Peg test: Right: 24.16 sec; Left: 33.40 sec ? ?SENSATION: ?Light touch: WFL ?However pt  has tingling/numbness and drops light objects from Rt hand, pain/temperature impaired as well. Sterognosis 3/5 id ? ?EDEMA: none in UE's, reports some in both feet but attributes to not moving around ? ? ?COGNITION: ?Overall cognitive status: Within functional limits for tasks assessed ? ?VISION: ?Subjective report: My glasses are not working now ?Baseline  vision: Bifocals ?Visual history:  none ? ?VISION ASSESSMENT: ?Eye alignment: Impaired: Lt eye slightly off w/ tracking to Lt ?Diplopia assessment: disappears with one eye closed, present in Left gaze, and at center ?Pt w/ pupil more constricted Lt eye ? ? ? ?OBSERVATIONS: Pt reports vision and numbness/tingling on Rt side is most affected from stroke but reports he is getting used to vision ? ? ? ? ?HOME EXERCISE PROGRAM: ?Not yet addressed ? ? ? ?GOALS: ?Goals reviewed with patient? Yes ? ? ?LONG TERM GOALS: Target date: 02/09/22 ? ?Independent with diplopia HEP  ?Baseline:  ?Goal status: INITIAL ? ?2.  Pt to improve grip strength Rt hand by 5 lbs ?Baseline: 101 lbs (Lt = 107 lbs) ?Goal status: INITIAL ? ?3.  Pt to report less drops in Rt hand t/o day, and verbalize understanding with safety considerations due to altered sensation Rt side  ?Baseline:  ?Goal status: INITIAL ? ?4.  Pt to perform environmental scanning with 85% accuracy or greater ?Baseline:  ?Goal status: INITIAL ? ?5.  Pt to report greater ease with buttons and tying shoes ?Baseline:  ?Goal status: INITIAL ? ? ?ASSESSMENT: ? ?CLINICAL IMPRESSION: ?Patient is a 51 y.o. male who was seen today for occupational therapy evaluation for CVA on 11/20/21 w/ residual Rt sided numbness/tingling, diplopia and decreased overall endurance. Pt would benefit from short duration of O.T. to address these deficits and return to PLOF.  ? ?PERFORMANCE DEFICITS in functional skills including ADLs, IADLs, coordination, sensation, strength, FMC, endurance, vision, and UE functional use ? ?IMPAIRMENTS are limiting patient from ADLs, IADLs, work, leisure, and social participation.  ? ?COMORBIDITIES may have co-morbidities  that affects occupational performance. Patient will benefit from skilled OT to address above impairments and improve overall function. ? ?MODIFICATION OR ASSISTANCE TO COMPLETE EVALUATION: No modification of tasks or assist necessary to complete an  evaluation. ? ?OT OCCUPATIONAL PROFILE AND HISTORY: Problem focused assessment: Including review of records relating to presenting problem. ? ?CLINICAL DECISION MAKING: LOW - limited treatment options, no task modification necessary ? ?REHAB POTENTIAL: Good ? ?EVALUATION COMPLEXITY: Low ? ? ? ?PLAN: ?OT FREQUENCY: 2x/week ? ?OT DURATION: 4 weeks ? ?PLANNED INTERVENTIONS: self care/ADL training, therapeutic exercise, therapeutic activity, neuromuscular re-education, patient/family education, visual/perceptual remediation/compensation, and energy conservation ? ?RECOMMENDED OTHER SERVICES: none ? ?CONSULTED AND AGREED WITH PLAN OF CARE: Patient ? ?PLAN FOR NEXT SESSION: check on [place] authorization, progress towards goals ? ? ? ? ? ? ? ? ? ? ?   ?

## 2022-01-10 ENCOUNTER — Encounter: Payer: Self-pay | Admitting: Occupational Therapy

## 2022-01-10 ENCOUNTER — Ambulatory Visit: Payer: No Typology Code available for payment source | Attending: Family Medicine | Admitting: Occupational Therapy

## 2022-01-10 ENCOUNTER — Ambulatory Visit: Payer: No Typology Code available for payment source | Admitting: Physical Therapy

## 2022-01-10 ENCOUNTER — Ambulatory Visit: Payer: Self-pay

## 2022-01-10 ENCOUNTER — Telehealth: Payer: Self-pay | Admitting: Physical Therapy

## 2022-01-10 ENCOUNTER — Telehealth: Payer: No Typology Code available for payment source | Admitting: Physician Assistant

## 2022-01-10 ENCOUNTER — Encounter: Payer: Self-pay | Admitting: Physical Therapy

## 2022-01-10 VITALS — BP 180/98 | HR 90

## 2022-01-10 DIAGNOSIS — M6281 Muscle weakness (generalized): Secondary | ICD-10-CM | POA: Insufficient documentation

## 2022-01-10 DIAGNOSIS — I10 Essential (primary) hypertension: Secondary | ICD-10-CM

## 2022-01-10 DIAGNOSIS — R2681 Unsteadiness on feet: Secondary | ICD-10-CM

## 2022-01-10 DIAGNOSIS — R208 Other disturbances of skin sensation: Secondary | ICD-10-CM | POA: Insufficient documentation

## 2022-01-10 DIAGNOSIS — R41842 Visuospatial deficit: Secondary | ICD-10-CM | POA: Insufficient documentation

## 2022-01-10 MED ORDER — HYDRALAZINE HCL 50 MG PO TABS
ORAL_TABLET | ORAL | 5 refills | Status: DC
  Filled 2022-01-10: qty 90, 30d supply, fill #0

## 2022-01-10 NOTE — Therapy (Signed)
?OUTPATIENT PHYSICAL THERAPY NEURO EVALUATION ? ? ?Patient Name: Alex [name] ?MRN: [id number] ?DOB:[birth year], 51 y.o., male ?Today's Date: 01/10/2022 ? ?PCP: Jonah Blue, MD  ?REFERRING PROVIDER: Shawnie Dapper, NP ? ? PT End of Session - 01/10/22 5176   ? ? Visit Number 1   ? Number of Visits 4   ? Date for PT Re-Evaluation 03/11/22   due to potential delay in scheduling  ? Authorization Type [facility]   ? Authorization - Number of Visits 15   ? PT Start Time [phone]   ? PT Stop Time 0912   full time not used due to elevated BP  ? PT Time Calculation (min) 25 min   ? Activity Tolerance Patient tolerated treatment well   limited by elevated BP  ? Behavior During Therapy [facility] for tasks assessed/performed   ? ?  ?  ? ?  ? ? ?Past Medical History:  ?Diagnosis Date  ? HTN (hypertension)   ? ?Past Surgical History:  ?Procedure Laterality Date  ? HAND SURGERY Right   ? TONSILLECTOMY    ? ?Patient Active Problem List  ? Diagnosis Date Noted  ? History of cerebrovascular accident (CVA) with residual deficit 12/12/2021  ? Tobacco dependence 12/12/2021  ? Alcohol use disorder 12/12/2021  ? Prediabetes 12/12/2021  ? Situational depression 12/12/2021  ? CVA (cerebral vascular accident) (HCC) 11/20/2021  ? Hypertensive urgency 01/01/2021  ? Tobacco use 01/01/2021  ? Alcohol use 01/01/2021  ? Substance use 01/01/2021  ? Vertigo 01/01/2021  ? 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 ? ?ONSET DATE: 01/04/2022 (date of referral) ? ?REFERRING DIAG: I63.02 (ICD-10-CM) - Cerebrovascular accident (CVA) due to thrombosis of basilar artery (HCC) ? ?THERAPY DIAG:  ?Unsteadiness on feet ? ?Muscle weakness (generalized) ? ?SUBJECTIVE:  ?                                                                                                                                                                                           ? ?SUBJECTIVE STATEMENT: ?Still having double vision (not sure if it is getting better or if hes getting used to it), numbness on whole  right side of body. Does not feel the difference between hot and cold on his R side. Reports his balance and walking has been pretty good, occasionally will stagger. Reports he is way more fatigued now.  ? ?Pt accompanied by: self ? ?PERTINENT HISTORY: PXT:GGYI, occassional illicit drug use, tobacco, COPD, HTN.  ? ?Patient presented on 11/20/2021 with reports of a pop in his head followed by right sided numbness, headaches, and blurred vision. CT was negative for acute stroke. MRI showed small acute infarct  in the posterior and inferior left pons with associated edema without mass effect.  ? ?PAIN:  ?Are you having pain? No ? ? ?Vitals:  ? 01/10/22 0852 01/10/22 0907  ?BP: (!) 158/99 (!) 180/98  ?Pulse: 91 90  ? ? ? ?PRECAUTIONS: Other: hx of high BP  ?No driving  ? ? ? ?FALLS: Has patient fallen in last 6 months? No ? ?LIVING ENVIRONMENT: ?Lives with:  a roommate ?Lives in: House/apartment ?Stairs: Yes: External: 5 steps; on right going up ?Has following equipment at home: None ? ?PLOF: Independent ? ?PATIENT GOALS Wants to get back to normal.  ? ?OBJECTIVE:  ? ? ?SENSATION: ?Light touch: Pt reports same bilat  ?Hot/Cold: Impaired RLE ? ? ?COORDINATION: ?WFL bilat  ? ? ? ? ?MMT:   ? ?MMT Right ?01/10/2022 Left ?01/10/2022  ?Hip flexion 4/5 5/5  ?Hip extension    ?Hip abduction    ?Hip adduction    ?Hip internal rotation    ?Hip external rotation    ?Knee flexion 5/5 5/5  ?Knee extension 5/5 5/5  ?Ankle dorsiflexion 5/5 5/5  ?Ankle plantarflexion    ?Ankle inversion    ?Ankle eversion    ?(Blank rows = not tested) ? ? ? ?TRANSFERS: ?Assistive device utilized: None  ?Sit to stand: Modified independence ?Stand to sit: Modified independence ? ? ?STAIRS: ? Level of Assistance: SBA ? Stair Negotiation Technique: Alternating Pattern  with No Rails ? Number of Stairs: 4  ? Height of Stairs: 6  ?Comments: Slightly wider BOS when descending, but no LOB.  ? ?GAIT: ?Gait pattern: step through pattern and wide BOS ?Distance walked:  8970-80' ?Assistive device utilized: None ?Level of assistance: SBA ? ? ?FUNCTIONAL TESTs:  ?5 times sit to stand: 6.81 seconds ?10 meter walk test: 9.4 seconds no AD = 3.48 ft/sec = community ambulator  ? ?PATIENT SURVEYS:  ?FOTO 68 (77 predicted) ? ?TODAY'S TREATMENT:  ?N/A at eval.  ? ?PATIENT EDUCATION: ?Education details: BP limitations for therapy, importance of following up with PCP regarding BP management (gave pt providers name and phone number to call and make an appt as pt did not have one scheduled), PT to follow up with PCP to make them aware, will put therapy on hold at this time until pt makes an appt with PCP for BP management, will end eval early due to BP incr with very minimal activity, reviewed the signs/sx of a CVA and to go to the ER if pt experiences any and importance of monitoring his BP at home.  ?Person educated: Patient ?Education method: Explanation ?Education comprehension: verbalized understanding ? ? ?HOME EXERCISE PROGRAM: ?Will provide at next tx session.  ? ? ? ?GOALS: ?Goals reviewed with patient? Yes ? ?LONG TERM GOALS: Target date: 02/07/2022 ? ?Pt will be independent with final HEP in order to build upon functional gains made in therapy. ? ?Baseline: dependent ?Goal status: INITIAL ? ?2.  Pt will undergo further assessment of FGA with LTG written when BP is appropriate for therapy. ? ?Baseline: Unable to assess at eval.  ?Goal status: INITIAL ? ?3.  Pt will ambulate at least 1,000' outdoors over unlevel grass/paved surfaces with mod I in order to demo improved community mobility.  ?Baseline:  ?Goal status: INITIAL ? ?4.  Pt will improve FOTO score to at least 77% in order to demo improved functional outcomes.   ?Baseline: 68% ?Goal status: INITIAL ? ? ?ASSESSMENT: ? ?CLINICAL IMPRESSION: ?Patient is a 51 year old male referred to Neuro  OPPT for CVA (posterior and inferior left pons) in March 2023.   Pt's PMH is significant for: ETOH, occassional illicit drug use, tobacco, COPD,  HTN. The following deficits were present during the exam: impaired sensation, decr activity tolerance, decr strength, impaired balance, gait abnormality. Pt's BP was elevated at start of session at 158/99 and after primarily being seated/minimal activity (going up and down 4 stairs and ambulating ~70-80'), pt's BP incr to 180/98. Discussed with pt will put remainder of eval on hold until after his sees his PCP for further BP management. Pt is currently taking his meds as prescribed and is asymptomatic. Pt will schedule a follow-up PT session for further assessment of balance after seeing PCP. 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 Do not anticipate that pt will need many visits of PT, but pt will benefit from further balance assessment when BP is under control.  ? ? ? ?OBJECTIVE IMPAIRMENTS Abnormal gait, decreased activity tolerance, decreased balance, decreased strength, impaired sensation, and impaired vision/preception.  ? ?ACTIVITY LIMITATIONS community activity and driving.  ? ?PERSONAL FACTORS Past/current experiences and 3+ comorbidities: ETOH, occassional illicit drug use, tobacco, COPD, HTN.   are also affecting patient's functional outcome.  ? ? ?REHAB POTENTIAL: Good ? ?CLINICAL DECISION MAKING: Stable/uncomplicated ? ?EVALUATION COMPLEXITY: Low ? ?PLAN: ?PT FREQUENCY: 1x/week ? ?PT DURATION: 8 weeks ? ?PLANNED INTERVENTIONS: Therapeutic exercises, Therapeutic activity, Neuromuscular re-education, Balance training, Gait training, Patient/Family education, and Stair training ? ?PLAN FOR NEXT SESSION: Monitor BP, perform FGA. Initiate HEP if needed. ? ? ?Drake Leach, PT, DPT  ?01/10/2022, 9:17 AM ? ? ? ? ? ? ? ?

## 2022-01-10 NOTE — Telephone Encounter (Signed)
?  Chief Complaint: HTN ?Symptoms: Slight HA that pt attributes to sleeping too much ?Frequency: ongoing -  ?Pertinent Negatives: Patient denies Weakness,  ?Disposition: [] ED /[x] Urgent Care (no appt availability in office) / [] Appointment(In office/virtual)/ []  [facility]/ [] Home Care/ [] Refused Recommended Disposition /[] [facility]/ []  Follow-up with PCP ?Additional Notes: PT was seen for PT att. Today at this appt BP was measure and found to be 180/99. Only 1 reading was taken. Pt will take 2 measurements 5 minutes apart prior to virtual appt. Pt sates he has a mild HA but attributes this to sleeping too much. ?Reason for Disposition ? Systolic BP  >= 180 OR Diastolic >= 110 ? ?Answer Assessment - Initial Assessment Questions ?1. BLOOD PRESSURE: "What is the blood pressure?" "Did you take at least two measurements 5 minutes apart?" ?    180/99  ?2. ONSET: "When did you take your blood pressure?" ?    [facility] pt lady ?3. HOW: "How did you obtain the blood pressure?" (e.g., visiting nurse, automatic home BP monitor) ?    PT  ?4. HISTORY: "Do you have a history of high blood pressure?" ?    yes ?5. MEDICATIONS: "Are you taking any medications for blood pressure?" "Have you missed any doses recently?" ?    Yes - no ?6. OTHER SYMPTOMS: "Do you have any symptoms?" (e.g., headache, chest pain, blurred vision, difficulty breathing, weakness) ?    No more than normal ?7. PREGNANCY: "Is there any chance you are pregnant?" "When was your last menstrual period?" ?    na ? ?Protocols used: Blood Pressure - High-A-AH ? ?

## 2022-01-10 NOTE — Telephone Encounter (Signed)
Dr. Laural Benes, ? ?I saw your patient Alex [name] for a PT eval this morning. His BP at rest was 158/99 and after being seated/very minimal activity his BP was 180/98. Pt was asymptomatic. Just wanted to make you aware as he has been taking his BP meds as prescribed. I advised him to call your office for a follow up BP visit. ? ?Thanks, ?Sherlie Ban, PT, DPT ?01/10/22 9:15 AM  ? ? ? ? ?[facility] ?[address] ?[address] ?[place], [place]  [address] ?Phone:  [phone] ?Fax:  [phone] ? ?

## 2022-01-10 NOTE — Patient Instructions (Signed)
?[name], thank you for joining [name], PA-C for today's virtual visit.  While this provider is not your primary care provider (PCP), if your PCP is located in our provider database this encounter information will be shared with them immediately following your visit. ? ?Consent: ?(Patient) Alex [name] provided verbal consent for this virtual visit at the beginning of the encounter. ? ?Current Medications: ? ?Current Outpatient Medications:  ?  amLODipine (NORVASC) 10 MG tablet, Take 1 tablet (10 mg total) by mouth daily., Disp: 30 tablet, Rfl: 3 ?  aspirin 81 MG EC tablet, Take 1 tablet (81 mg total) by mouth daily. Swallow whole., Disp: 30 tablet, Rfl: 11 ?  hydrALAZINE (APRESOLINE) 50 MG tablet, Take 1 tablet (50 mg total) by mouth 3 (three) times daily., Disp: 90 tablet, Rfl: 3 ?  hydrochlorothiazide (HYDRODIURIL) 25 MG tablet, Take 1 tablet (25 mg total) by mouth daily., Disp: 30 tablet, Rfl: 3 ?  lisinopril (ZESTRIL) 40 MG tablet, Take 1 tablet (40 mg total) by mouth daily., Disp: 30 tablet, Rfl: 3 ?  rosuvastatin (CRESTOR) 10 MG tablet, Take 1 tablet (10 mg total) by mouth daily., Disp: 30 tablet, Rfl: 3  ? ?Medications ordered in this encounter:  ?No orders of the defined types were placed in this encounter. ?  ? ?*If you need refills on other medications prior to your next appointment, please contact your pharmacy* ? ?Follow-Up: ?Call back or seek an in-person evaluation if the symptoms worsen or if the condition fails to improve as anticipated. ? ?Other Instructions ?Please continue medications as directed, making sure to take 2 of the 25mg  tablets of Hydralazine three times daily until you hear from your PCP about getting things straightened out with the pharmacy.  ? ?Keep a low sodium diet -- see below. ? ?Check BP daily and record so you can review with your regular providers at follow-up. ? ?If you have any chest pain, shortness of breath, dizziness, neurological change or severe  headache with elevated BP -- ER ASAP ? ?DASH Eating Plan ?DASH stands for Dietary Approaches to Stop Hypertension. The DASH eating plan is a healthy eating plan that has been shown to: ?Reduce high blood pressure (hypertension). ?Reduce your risk for type 2 diabetes, heart disease, and stroke. ?Help with weight loss. ?What are tips for following this plan? ?Reading food labels ?Check food labels for the amount of salt (sodium) per serving. Choose foods with less than 5 percent of the Daily Value of sodium. Generally, foods with less than 300 milligrams (mg) of sodium per serving fit into this eating plan. ?To find whole grains, look for the word "whole" as the first word in the ingredient list. ?Shopping ?Buy products labeled as "low-sodium" or "no salt added." ?Buy fresh foods. Avoid canned foods and pre-made or frozen meals. ?Cooking ?Avoid adding salt when cooking. Use salt-free seasonings or herbs instead of table salt or sea salt. Check with your health care provider or pharmacist before using salt substitutes. ?Do not fry foods. Cook foods using healthy methods such as baking, boiling, grilling, roasting, and broiling instead. ?Cook with heart-healthy oils, such as olive, canola, avocado, soybean, or sunflower oil. ?Meal planning ? ?Eat a balanced diet that includes: ?4 or more servings of fruits and 4 or more servings of vegetables each day. Try to fill one-half of your plate with fruits and vegetables. ?6-8 servings of whole grains each day. ?Less than 6 oz (170 g) of lean meat, poultry, or fish each day.  A 3-oz (85-g) serving of meat is about the same size as a deck of cards. One egg equals 1 oz (28 g). ?2-3 servings of low-fat dairy each day. One serving is 1 cup (237 mL). ?1 serving of nuts, seeds, or beans 5 times each week. ?2-3 servings of heart-healthy fats. Healthy fats called omega-3 fatty acids are found in foods such as walnuts, flaxseeds, fortified milks, and eggs. These fats are also found in  cold-water fish, such as sardines, salmon, and mackerel. ?Limit how much you eat of: ?Canned or prepackaged foods. ?Food that is high in trans fat, such as some fried foods. ?Food that is high in saturated fat, such as fatty meat. ?Desserts and other sweets, sugary drinks, and other foods with added sugar. ?Full-fat dairy products. ?Do not salt foods before eating. ?Do not eat more than 4 egg yolks a week. ?Try to eat at least 2 vegetarian meals a week. ?Eat more home-cooked food and less restaurant, buffet, and fast food. ?Lifestyle ?When eating at a restaurant, ask that your food be prepared with less salt or no salt, if possible. ?If you drink alcohol: ?Limit how much you use to: ?0-1 drink a day for women who are not pregnant. ?0-2 drinks a day for men. ?Be aware of how much alcohol is in your drink. In the [place] one drink equals one 12 oz bottle of beer (355 mL), one 5 oz glass of wine (148 mL), or one 1? oz glass of hard liquor (44 mL). ?General information ?Avoid eating more than 2,300 mg of salt a day. If you have hypertension, you may need to reduce your sodium intake to 1,500 mg a day. ?Work with your health care provider to maintain a healthy body weight or to lose weight. Ask what an ideal weight is for you. ?Get at least 30 minutes of exercise that causes your heart to beat faster (aerobic exercise) most days of the week. Activities may include walking, swimming, or biking. ?Work with your health care provider or dietitian to adjust your eating plan to your individual calorie needs. ?What foods should I eat? ?Fruits ?All fresh, dried, or frozen fruit. Canned fruit in natural juice (without added sugar). ?Vegetables ?Fresh or frozen vegetables (raw, steamed, roasted, or grilled). Low-sodium or reduced-sodium tomato and vegetable juice. Low-sodium or reduced-sodium tomato sauce and tomato paste. Low-sodium or reduced-sodium canned vegetables. ?Grains ?Whole-grain or whole-wheat bread. Whole-grain or  whole-wheat pasta. Brown rice. Orpah Cobb. Bulgur. Whole-grain and low-sodium cereals. Pita bread. Low-fat, low-sodium crackers. Whole-wheat flour tortillas. ?Meats and other proteins ?Skinless chicken or [place]. Ground chicken or [place]. Pork with fat trimmed off. Fish and seafood. Egg whites. Dried beans, peas, or lentils. Unsalted nuts, nut butters, and seeds. Unsalted canned beans. Lean cuts of beef with fat trimmed off. Low-sodium, lean precooked or cured meat, such as sausages or meat loaves. ?Dairy ?Low-fat (1%) or fat-free (skim) milk. Reduced-fat, low-fat, or fat-free cheeses. Nonfat, low-sodium ricotta or cottage cheese. Low-fat or nonfat yogurt. Low-fat, low-sodium cheese. ?Fats and oils ?Soft margarine without trans fats. Vegetable oil. Reduced-fat, low-fat, or light mayonnaise and salad dressings (reduced-sodium). Canola, safflower, olive, avocado, soybean, and sunflower oils. Avocado. ?Seasonings and condiments ?Herbs. Spices. Seasoning mixes without salt. ?Other foods ?Unsalted popcorn and pretzels. Fat-free sweets. ?The items listed above may not be a complete list of foods and beverages you can eat. Contact a dietitian for more information. ?What foods should I avoid? ?Fruits ?Canned fruit in a light or heavy syrup. Fried fruit.  Fruit in cream or butter sauce. ?Vegetables ?Creamed or fried vegetables. Vegetables in a cheese sauce. Regular canned vegetables (not low-sodium or reduced-sodium). Regular canned tomato sauce and paste (not low-sodium or reduced-sodium). Regular tomato and vegetable juice (not low-sodium or reduced-sodium). Rosita FirePickles. Olives. ?Grains ?Baked goods made with fat, such as croissants, muffins, or some breads. Dry pasta or rice meal packs. ?Meats and other proteins ?Fatty cuts of meat. Ribs. Fried meat. Tomasa BlaseBacon. Bologna, salami, and other precooked or cured meats, such as sausages or meat loaves. Fat from the back of a pig (fatback). Bratwurst. Salted nuts and seeds. Canned  beans with added salt. Canned or smoked fish. Whole eggs or egg yolks. Chicken or [place] with skin. ?Dairy ?Whole or 2% milk, cream, and half-and-half. Whole or full-fat cream cheese. Whole-fat or sweetened y

## 2022-01-10 NOTE — Progress Notes (Signed)
?Virtual Visit Consent  ? ?[name], you are scheduled for a virtual visit with a [facility] provider today. Just as with appointments in the office, your consent must be obtained to participate. Your consent will be active for this visit and any virtual visit you may have with one of our providers in the next 365 days. If you have a MyChart account, a copy of this consent can be sent to you electronically. ? ?As this is a virtual visit, video technology does not allow for your provider to perform a traditional examination. This may limit your provider's ability to fully assess your condition. If your provider identifies any concerns that need to be evaluated in person or the need to arrange testing (such as labs, EKG, etc.), we will make arrangements to do so. Although advances in technology are sophisticated, we cannot ensure that it will always work on either your end or our end. If the connection with a video visit is poor, the visit may have to be switched to a telephone visit. With either a video or telephone visit, we are not always able to ensure that we have a secure connection. ? ?By engaging in this virtual visit, you consent to the provision of healthcare and authorize for your insurance to be billed (if applicable) for the services provided during this visit. Depending on your insurance coverage, you may receive a charge related to this service. ? ?I need to obtain your verbal consent now. Are you willing to proceed with your visit today? [name] has provided verbal consent on 01/10/2022 for a virtual visit (video or telephone). [name], PA-C ? ?Date: 01/10/2022 12:01 PM ? ?Virtual Visit via Video Note  ? ?[name], Alex [name], connected with  Alex [name]  ([id number], 14-Nov-1970) on 01/10/22 at 11:15 AM EDT by a video-enabled telemedicine application and verified that I am speaking with the correct person using two identifiers. ? ?Location: ?Patient: Virtual Visit Location  Patient: Home ?Provider: Virtual Visit Location Provider: Home Office ?  ?I discussed the limitations of evaluation and management by telemedicine and the availability of in person appointments. The patient expressed understanding and agreed to proceed.   ? ?History of Present Illness: ?Alex [name] is a 51 y.o. who identifies as a male who was assigned male at birth, and is being seen today for follow-up of BP levels. Was seen this morning for initial evaluation with PT/OT set up by his care team after recent CVA in March of this year. BP at time of visit today was initially listed at 158/99 but after some activity and recheck was 180/98. Patient had noted occasional headache but none at that time. Was noted to be asymptomatic. PT called PCP office and patient sent to RN triage where virtual appointment was scheduled. Patient was instructed to recheck BP 2 x after getting home, 5 minutes at least between each check. ? ?Patient endorses he has felt fine today. Is taking his medications as directed but notes his hydralazine is 25 mg TID instead of the 50 mg TID sent in by his PCP last month. States "this is what the pharmacy sent me". Notes checking BP periodically but not daily like he is supposed to. Patient denies chest pain, palpitations, lightheadedness, dizziness, vision changes or frequent headaches. ?Headache he mentioned occasionally getting to PT is usually after he sleeps in a weird position for prolonged time periods -- notes this quickly goes away once up and moving around. ? ?Had rechecked BP in  L arm sitting after resting 10 minutes, checking 5-10 minutes apart each time. Notes BP 145/82, 140/82 on recheck.  ? ? ?HPI: HPI  ?Problems:  ?Patient Active Problem List  ? Diagnosis Date Noted  ? History of cerebrovascular accident (CVA) with residual deficit 12/12/2021  ? Tobacco dependence 12/12/2021  ? Alcohol use disorder 12/12/2021  ? Prediabetes 12/12/2021  ? Situational depression 12/12/2021  ? CVA  (cerebral vascular accident) (HCC) 11/20/2021  ? Hypertensive urgency 01/01/2021  ? Tobacco use 01/01/2021  ? Alcohol use 01/01/2021  ? Substance use 01/01/2021  ? Vertigo 01/01/2021  ? 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  ?Allergies: No Known Allergies ?Medications:  ?Current Outpatient Medications:  ?  amLODipine (NORVASC) 10 MG tablet, Take 1 tablet (10 mg total) by mouth daily., Disp: 30 tablet, Rfl: 3 ?  aspirin 81 MG EC tablet, Take 1 tablet (81 mg total) by mouth daily. Swallow whole., Disp: 30 tablet, Rfl: 11 ?  hydrALAZINE (APRESOLINE) 50 MG tablet, Take 1 tablet (50 mg total) by mouth 3 (three) times daily., Disp: 90 tablet, Rfl: 3 ?  hydrochlorothiazide (HYDRODIURIL) 25 MG tablet, Take 1 tablet (25 mg total) by mouth daily., Disp: 30 tablet, Rfl: 3 ?  lisinopril (ZESTRIL) 40 MG tablet, Take 1 tablet (40 mg total) by mouth daily., Disp: 30 tablet, Rfl: 3 ?  rosuvastatin (CRESTOR) 10 MG tablet, Take 1 tablet (10 mg total) by mouth daily., Disp: 30 tablet, Rfl: 3 ? ?Observations/Objective: ?Patient is well-developed, well-nourished in no acute distress.  ?Resting comfortably at home.  ?Head is normocephalic, atraumatic.  ?No labored breathing. ?Speech is clear and coherent with logical content.  ?Patient is alert and oriented at baseline.  ? ?Assessment and Plan: ?1. Resistant hypertension ? ?BP improved now. Still slightly above goal but likely due to not taking correct dose of Hydralazine. Will have him take two 25 mg tablets (50 mg dosing) TID and continue other medications as directed. System message sent directly to PCP to make her aware so she can work with pharmacy to get correct script filled and to follow-up with patient regarding ongoing HTN management. He is to follow DASH diet and check BP daily, recording so he can give to PCP at follow-ups. Strict ER precautions reviewed.  ? ?Follow Up Instructions: ?I discussed the assessment and treatment plan with the patient. The patient was provided an  opportunity to ask questions and all were answered. The patient agreed with the plan and demonstrated an understanding of the instructions.  A copy of instructions were sent to the patient via MyChart unless otherwise noted below.  ? ?The patient was advised to call back or seek an in-person evaluation if the symptoms worsen or if the condition fails to improve as anticipated. ? ?Time:  ?I spent 15 minutes with the patient via telehealth technology discussing the above problems/concerns.   ? ?[name], PA-C ?

## 2022-01-11 ENCOUNTER — Other Ambulatory Visit: Payer: Self-pay

## 2022-01-11 ENCOUNTER — Telehealth: Payer: Self-pay | Admitting: Internal Medicine

## 2022-01-11 DIAGNOSIS — I10 Essential (primary) hypertension: Secondary | ICD-10-CM

## 2022-01-11 MED ORDER — HYDRALAZINE HCL 50 MG PO TABS: 50.0000 mg | ORAL_TABLET | Freq: Three times a day (TID) | ORAL | 3 refills | Status: DC

## 2022-01-11 NOTE — Telephone Encounter (Signed)
Phone call placed to the patient this morning to clarify the dose that he has been taking on hydralazine.  I received a message from the physical therapist and [facility] yesterday that his blood pressure remains elevated in therapy session.  On his last visit with me 1 month ago, we increased the hydralazine from 25 mg 3 times a day to 50 mg 3 times a day.  However, patient states that he was still taking the 25 mg because that is what the pharmacy had dispensed to him.  I had sent the prescription for the 50 mg tablet to our pharmacy.  However patient states he uses [facility] on [address].  He did a virtual visit with a physician assistant yesterday who instructed him to start taking 2 of the 25 mg tablets 3 times a day until he was able to touch base with me. ?I informed patient that I will go ahead and send the prescription for the 50 mg tablet to [facility] on [address].  I also informed him that I had sent a message to my RN last night with instructions to have him increase to 75 mg thinking that he was already on the 50 mg tablet.  If he is called, I told him to disregard that instruction.  I will send an updated message to the RN informing her of my conversation with him this morning. ?So the bottom line is: ?Increase hydralazine to 50 mg 3 times a day as intended on his last visit with me.  I will send [facility] to [facility] on [address]. ?We will try to get him in with the clinical pharmacist for blood pressure recheck. ?Patient expressed understanding and was able to repeat back instructions to me. ? ? ? ? ?

## 2022-01-11 NOTE — Telephone Encounter (Signed)
Adessed by Dr Laural Benes this morning ?

## 2022-01-17 ENCOUNTER — Ambulatory Visit: Payer: No Typology Code available for payment source | Admitting: Occupational Therapy

## 2022-01-19 ENCOUNTER — Ambulatory Visit: Payer: No Typology Code available for payment source | Admitting: Occupational Therapy

## 2022-01-24 ENCOUNTER — Telehealth: Payer: Self-pay | Admitting: Occupational Therapy

## 2022-01-24 ENCOUNTER — Ambulatory Visit: Payer: No Typology Code available for payment source | Admitting: Occupational Therapy

## 2022-01-24 NOTE — Telephone Encounter (Signed)
Called and spoke directly with patient re: missed O.T. appointments. Pt assumed appointments were already cancelled until [place] approved visits. [facility] and will try and get approval for O.T./P.T. and will call back to reschedule once approved. Pt also states he has some other medical issues that will need to be resolved before returning as well. Therapist asked if he wishes to cancel remaining scheduled appointments now and to call back when he has approval and medical issues resolved. Pt felt this was the best course of action. Therapist confirmed that patient had this clinic's number to call back when appropriate.

## 2022-01-26 ENCOUNTER — Ambulatory Visit: Payer: No Typology Code available for payment source | Admitting: Occupational Therapy

## 2022-01-27 ENCOUNTER — Ambulatory Visit: Payer: Self-pay | Admitting: *Deleted

## 2022-01-27 NOTE — Telephone Encounter (Signed)
Summary: swollen legs   Patient was seen at [facility] on 5/24 and was advised to contact PCP regarding both legs swelling. Patient has no  discoloration, just swollen, no pain to touch. Patient unsure if there is any medication can take to reduce the swelling.      Reason for Disposition  [1] MODERATE leg swelling (e.g., swelling extends up to knees) AND [2] new-onset or worsening  Answer Assessment - Initial Assessment Questions 1. ONSET: "When did the swelling start?" (e.g., minutes, hours, days)     1 week ago 2. LOCATION: "What part of the leg is swollen?"  "Are both legs swollen or just one leg?"     Both- thigh down to feet, abdominal bloating 3. SEVERITY: "How bad is the swelling?" (e.g., localized; mild, moderate, severe)  - Localized - small area of swelling localized to one leg  - MILD pedal edema - swelling limited to foot and ankle, pitting edema < 1/4 inch (6 mm) deep, rest and elevation eliminate most or all swelling  - MODERATE edema - swelling of lower leg to knee, pitting edema > 1/4 inch (6 mm) deep, rest and elevation only partially reduce swelling  - SEVERE edema - swelling extends above knee, facial or hand swelling present      moderate 4. REDNESS: "Does the swelling look red or infected?"     Only when standing 5. PAIN: "Is the swelling painful to touch?" If Yes, ask: "How painful is it?"   (Scale 1-10; mild, moderate or severe)     No- discomfort, itching skin 6. FEVER: "Do you have a fever?" If Yes, ask: "What is it, how was it measured, and when did it start?"      no 7. CAUSE: "What do you think is causing the leg swelling?"     unsure 8. MEDICAL HISTORY: "Do you have a history of heart failure, kidney disease, liver failure, or cancer?"     no 9. RECURRENT SYMPTOM: "Have you had leg swelling before?" If Yes, ask: "When was the last time?" "What happened that time?"     Not without injury 10. OTHER SYMPTOMS: "Do you have any other symptoms?"  (e.g., chest pain, difficulty breathing)       Little SOB 11. PREGNANCY: "Is there any chance you are pregnant?" "When was your last menstrual period?"  Protocols used: Leg Swelling and Edema-A-AH

## 2022-01-27 NOTE — Telephone Encounter (Signed)
  Chief Complaint: bilateral leg swelling Symptoms: leg swelling, abdominal swelling, SOB exertion Frequency: 1 week Pertinent Negatives: Patient denies chest pain Disposition: [] ED /[x] Urgent Care (no appt availability in office) / [] Appointment(In office/virtual)/ []  [facility]/ [] Home Care/ [] Refused Recommended Disposition /[]  Mobile Bus/ []  Follow-up with PCP Additional Notes: Patient was seen at [facility] and advised follow up with PCP for edema and symptoms for medication review. Patient advised no open appointment- UC for evaluation.

## 2022-01-27 NOTE — Telephone Encounter (Signed)
Patient was called and given an appointment for his leg swelling

## 2022-01-31 ENCOUNTER — Ambulatory Visit: Payer: No Typology Code available for payment source | Admitting: Occupational Therapy

## 2022-02-02 ENCOUNTER — Encounter: Payer: No Typology Code available for payment source | Admitting: Occupational Therapy

## 2022-02-07 ENCOUNTER — Encounter: Payer: No Typology Code available for payment source | Admitting: Occupational Therapy

## 2022-02-09 ENCOUNTER — Encounter: Payer: No Typology Code available for payment source | Admitting: Occupational Therapy

## 2022-02-10 ENCOUNTER — Ambulatory Visit: Payer: No Typology Code available for payment source | Attending: Internal Medicine | Admitting: Internal Medicine

## 2022-02-10 ENCOUNTER — Encounter: Payer: Self-pay | Admitting: Internal Medicine

## 2022-02-10 VITALS — BP 148/91 | HR 87 | Wt 249.0 lb

## 2022-02-10 DIAGNOSIS — I10 Essential (primary) hypertension: Secondary | ICD-10-CM

## 2022-02-10 DIAGNOSIS — R600 Localized edema: Secondary | ICD-10-CM

## 2022-02-10 MED ORDER — LISINOPRIL 10 MG PO TABS: 10.0000 mg | ORAL_TABLET | Freq: Every day | ORAL | 1 refills | Status: DC

## 2022-02-10 NOTE — Patient Instructions (Signed)
Stop amlodipine 10 mg. After you have been off the medication for 1 week, please let me know if the swelling in the leg has not resolved.  I have sent the prescription to the [facility] pharmacy for lisinopril 10 mg daily to take the place of the amlodipine.

## 2022-02-10 NOTE — Progress Notes (Signed)
Patient ID: Alex [name], male    DOB: May 18, 1971  MRN: [id number]  CC: Leg Swelling   Subjective: Alex [name] is a 51 y.o. male who presents for UC visit for LE edema His concerns today include:  Pt with hx of HTN, CVA with residual right-sided numbness and diplopia, tob dep, preDM, subst use disorder, ETOH use disorder  C/o swelling in both lower legs times a few weeks.   [facility] and states he was told used to try to move more and elevate the legs.  This has not helped. Endorses SOB when he tries to move fast and when he bends over to tie boots.  No SOB when laying down at nights.  Tries to limit salt in foods.  We note that his weight today is up almost 25 pounds compared to when he saw the neurologist 1 month ago.  Patient thinks that the reading from the neurologist was inaccurate but feels that he has gained some weight.  Reports no significant changes in his eating habits including increased quantity. Has med bottles with him.  Taking HCTZ 25 mg , Norvasc 10 mg, Hydralazine 50 mg TID.  Also should be on Lisinopril 40 mg but does not have this bottle with him today and not sure if he ever received it.    Patient Active Problem List   Diagnosis Date Noted   History of cerebrovascular accident (CVA) with residual deficit 12/12/2021   Tobacco dependence 12/12/2021   Alcohol use disorder 12/12/2021   Prediabetes 12/12/2021   Situational depression 12/12/2021   CVA (cerebral vascular accident) (HCC) 11/20/2021   Hypertensive urgency 01/01/2021   Tobacco use 01/01/2021   Alcohol use 01/01/2021   Substance use 01/01/2021   Vertigo 01/01/2021   Resistant hypertension 01/01/2021     Current Outpatient Medications on File Prior to Visit  Medication Sig Dispense Refill   aspirin 81 MG EC tablet Take 1 tablet (81 mg total) by mouth daily. Swallow whole. 30 tablet 11   hydrALAZINE (APRESOLINE) 50 MG tablet Take 1 tablet (50 mg total) by mouth 3 (three) times  daily. 90 tablet 3   rosuvastatin (CRESTOR) 10 MG tablet Take 1 tablet (10 mg total) by mouth daily. 30 tablet 3   hydrochlorothiazide (HYDRODIURIL) 25 MG tablet Take 1 tablet (25 mg total) by mouth daily. 30 tablet 3   No current facility-administered medications on file prior to visit.    No Known Allergies  Social History   Socioeconomic History   Marital status: Divorced    Spouse name: Not on file   Number of children: Not on file   Years of education: Not on file   Highest education level: Not on file  Occupational History   Not on file  Tobacco Use   Smoking status: Every Day    Packs/day: 1.00    Years: 40.00    Total pack years: 40.00    Types: Cigarettes   Smokeless tobacco: Never  Substance and Sexual Activity   Alcohol use: Yes    Alcohol/week: 6.0 - 12.0 standard drinks of alcohol    Types: 6 - 12 Cans of beer per week    Comment: socially   Drug use: Not Currently    Types: Methamphetamines, Marijuana    Comment: 1 month prior to admission   Sexual activity: Not on file  Other Topics Concern   Not on file  Social History Narrative   Right handed   Lives  with roommates   Caffeine use: coffee daily, mostly water otherwise   Social Determinants of Corporate investment banker Strain: Not on file  Food Insecurity: Not on file  Transportation Needs: Not on file  Physical Activity: Not on file  Stress: Not on file  Social Connections: Not on file  Intimate Partner Violence: Not on file    Family History  Problem Relation Age of Onset   Diabetes Mother    Hypotension Mother    Heart disease Father    Hypertension Father     Past Surgical History:  Procedure Laterality Date   HAND SURGERY Right    TONSILLECTOMY      ROS: Review of Systems Negative except as stated above  PHYSICAL EXAM: BP (!) 148/91   Pulse 87   Wt 249 lb (112.9 kg)   SpO2 94%   BMI 34.73 kg/m   Wt Readings from Last 3 Encounters:  02/10/22 249 lb (112.9 kg)   01/04/22 224 lb 8 oz (101.8 kg)  12/12/21 210 lb 9.6 oz (95.5 kg)    Physical Exam   General appearance - alert, well appearing, middle-age Caucasian male and in no distress Mental status - normal mood, behavior, speech, dress, motor activity, and thought processes Chest - clear to auscultation, no wheezes, rales or rhonchi, symmetric air entry Heart -regular rate and rhythm.  2/6 systolic ejection murmur heard in the upper left and right sternal borders.  No JVD. Extremities -2+ edema of both lower extremities from the knees down including pedal edema.     Latest Ref Rng & Units 11/20/2021   10:26 PM 11/20/2021    3:27 PM 11/20/2021    3:00 PM  CMP  Glucose 70 - 99 mg/dL  163  846   BUN 6 - 20 mg/dL  7  9   Creatinine 6.59 - 1.24 mg/dL  9.35  7.01   Sodium 779 - 145 mmol/L  138  135   Potassium 3.5 - 5.1 mmol/L  3.5  3.6   Chloride 98 - 111 mmol/L  102  102   CO2 22 - 32 mmol/L   23   Calcium 8.9 - 10.3 mg/dL   8.6   Total Protein 6.5 - 8.1 g/dL 7.1   7.3   Total Bilirubin 0.3 - 1.2 mg/dL 0.6   0.5   Alkaline Phos 38 - 126 U/L 68   73   AST 15 - 41 U/L 38   34   ALT 0 - 44 U/L 45   44    Lipid Panel     Component Value Date/Time   CHOL 164 11/21/2021 0159   TRIG 176 (H) 11/21/2021 0159   HDL 47 11/21/2021 0159   CHOLHDL 3.5 11/21/2021 0159   VLDL 35 11/21/2021 0159   LDLCALC 82 11/21/2021 0159    CBC    Component Value Date/Time   WBC 8.8 11/20/2021 2226   RBC 5.15 11/20/2021 2226   HGB 15.3 11/20/2021 2226   HCT 45.0 11/20/2021 2226   PLT 213 11/20/2021 2226   MCV 87.4 11/20/2021 2226   MCH 29.7 11/20/2021 2226   MCHC 34.0 11/20/2021 2226   RDW 13.5 11/20/2021 2226   LYMPHSABS 4.0 11/20/2021 2226   MONOABS 0.5 11/20/2021 2226   EOSABS 0.2 11/20/2021 2226   BASOSABS 0.0 11/20/2021 2226    ASSESSMENT AND PLAN: 1. Edema of both legs Differential diagnoses include medication induced i.e. Norvasc versus underlying CHF.  However patient had echo done  11/2021  that revealed normal EF and left ventricular function with G1DD.  No significant valve abnormality on echo except for trace MR. We will check chemistry and BNP today. Stop Norvasc. Restart lisinopril which he should have been on but does not have.  Previously lisinopril was 40 mg but since he has not been on it, we will restart at 10 mg. After he has been off the Norvasc for 1 week, he will let me know whether the swelling has resolved.  If it has not.  We will switch to HCTZ to furosemide.  Patient expressed understanding of the plan and was able to repeat back instructions to me. - Brain natriuretic peptide - Basic Metabolic Panel  2. Essential hypertension See #1 above. We will leave him on HCTZ 25 mg daily, hydralazine 50 mg 3 times a day and restart lisinopril but at a dose of 10 mg daily. - lisinopril (ZESTRIL) 10 MG tablet; Take 1 tablet (10 mg total) by mouth daily.  Dispense: 90 tablet; Refill: 1    Patient was given the opportunity to ask questions.  Patient verbalized understanding of the plan and was able to repeat key elements of the plan.   This documentation was completed using Paediatric nurse.  Any transcriptional errors are unintentional.  Orders Placed This Encounter  Procedures   Brain natriuretic peptide   Basic Metabolic Panel     Requested Prescriptions   Signed Prescriptions Disp Refills   lisinopril (ZESTRIL) 10 MG tablet 90 tablet 1    Sig: Take 1 tablet (10 mg total) by mouth daily.    No follow-ups on file.  Jonah Blue, MD, FACP

## 2022-02-11 LAB — BASIC METABOLIC PANEL
BUN/Creatinine Ratio: 10 (ref 9–20)
BUN: 9 mg/dL (ref 6–24)
CO2: 21 mmol/L (ref 20–29)
Calcium: 9.7 mg/dL (ref 8.7–10.2)
Chloride: 98 mmol/L (ref 96–106)
Creatinine, Ser: 0.89 mg/dL (ref 0.76–1.27)
Glucose: 97 mg/dL (ref 70–99)
Potassium: 4.2 mmol/L (ref 3.5–5.2)
Sodium: 136 mmol/L (ref 134–144)
eGFR: 103 mL/min/{1.73_m2} (ref >59)

## 2022-02-11 LAB — BRAIN NATRIURETIC PEPTIDE: BNP: 14.6 pg/mL (ref 0.0–100.0)

## 2022-03-24 ENCOUNTER — Ambulatory Visit: Payer: No Typology Code available for payment source | Admitting: Internal Medicine

## 2022-07-14 NOTE — Progress Notes (Deleted)
[name] [address] [place]. [place] [address] [phone]       HOSPITAL FOLLOW UP NOTE  [name] Date of Birth:  February 05, 1971 Medical Record Number:  [id number]   Reason for Referral:  hospital stroke follow up    SUBJECTIVE:   CHIEF COMPLAINT:  No chief complaint on file.   HPI:   Alex [name] is a 51 y.o. who  has a past medical history of HTN (hypertension).  Patient presented on 11/20/2021 with reports of a pop in his head followed by right sided numbness, headaches, and blurred vision. CT was negative for acute stroke. 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 BP initially 226/126 but improved with HTN meds in hospital. He was started on dapt with asa 81mg  and Plavix for 3 weeks then asa alone. Rosuvastatin 10mg  started. PT eval recommended outpatient therapy. Personally reviewed hospitalization pertinent progress notes, lab work and imaging.  Evaluated by Dr .   Since discharge home, he continues to have right side numbness (whole body). He had some tingling of right hand prior to stroke. He has not checked BP at home. He saw PCP for HTN and reports that dose of meds was increased. He is taking meds as prescribed. He is taking rosuvastatin. He continues to have blurred vision (not new). He has double vision when looking to the left. He has not seen ophthalmology. He reports that he no longer takes methamphetamines and not smoking marijuana. He limits beer to dinner. He has gained about 10 pounds since stroke. Feels this is due to drinking less beer and eating more. He is more tired. He is not as active. 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   UPDATE 07/17/2022 ALL: Alex [name] returns for follow up for CVA in 11/2021.   Double vision? Ophthalmology  Cognitive? ST?  BP is  He continues rosuvastatin 10mg  and asa 81mg  daily.   Drinking? Drugs?  PERTINENT  IMAGING/LABS  CT head - No acute intracranial abnormality. MRI head - Small acute infarct in the posterior and inferior left pons. Associated edema without mass effect.  CTA H&N - No emergent large vessel occlusion or high-grade stenosis of the intracranial arteries. Approximately 50% stenosis of the proximal right internal carotid artery secondary to calcified atherosclerosis. 2D Echo EF 60 to 65%   A1C Lab Results  Component Value Date   HGBA1C 5.7 (H) 11/21/2021    Lipid Panel     Component Value Date/Time   CHOL 164 11/21/2021 0159   TRIG 176 (H) 11/21/2021 0159   HDL 47 11/21/2021 0159   CHOLHDL 3.5 11/21/2021 0159   VLDL 35 11/21/2021 0159   LDLCALC 82 11/21/2021 0159      ROS:   14 system review of systems performed and negative with exception of those listed in HPI  PMH:  Past Medical History:  Diagnosis Date   HTN (hypertension)     PSH:  Past Surgical History:  Procedure Laterality Date   HAND SURGERY Right    TONSILLECTOMY      Social History:  Social History   Socioeconomic History   Marital status: Divorced    Spouse name: Not on file   Number of children: Not on file   Years of education: Not on file   Highest education level: Not on file  Occupational History   Not on file  Tobacco Use   Smoking status: Every Day  Packs/day: 1.00    Years: 40.00    Total pack years: 40.00    Types: Cigarettes   Smokeless tobacco: Never  Substance and Sexual Activity   Alcohol use: Yes    Alcohol/week: 6.0 - 12.0 standard drinks of alcohol    Types: 6 - 12 Cans of beer per week    Comment: socially   Drug use: Not Currently    Types: Methamphetamines, Marijuana    Comment: 1 month prior to admission   Sexual activity: Not on file  Other Topics Concern   Not on file  Social History Narrative   Right handed   Lives with roommates   Caffeine use: coffee daily, mostly water otherwise   Social Determinants of Health   Financial Resource Strain:  Not on file  Food Insecurity: Not on file  Transportation Needs: Not on file  Physical Activity: Not on file  Stress: Not on file  Social Connections: Not on file  Intimate Partner Violence: Not on file    Family History:  Family History  Problem Relation Age of Onset   Diabetes Mother    Hypotension Mother    Heart disease Father    Hypertension Father     Medications:   Current Outpatient Medications on File Prior to Visit  Medication Sig Dispense Refill   aspirin 81 MG EC tablet Take 1 tablet (81 mg total) by mouth daily. Swallow whole. 30 tablet 11   hydrALAZINE (APRESOLINE) 50 MG tablet Take 1 tablet (50 mg total) by mouth 3 (three) times daily. 90 tablet 3   hydrochlorothiazide (HYDRODIURIL) 25 MG tablet Take 1 tablet (25 mg total) by mouth daily. 30 tablet 3   lisinopril (ZESTRIL) 10 MG tablet Take 1 tablet (10 mg total) by mouth daily. 90 tablet 1   rosuvastatin (CRESTOR) 10 MG tablet Take 1 tablet (10 mg total) by mouth daily. 30 tablet 3   No current facility-administered medications on file prior to visit.    Allergies:  No Known Allergies    OBJECTIVE:  Physical Exam  There were no vitals filed for this visit.  There is no height or weight on file to calculate BMI. No results found.     02/10/2022   10:17 AM  Depression screen PHQ 2/9  Decreased Interest 1  Down, Depressed, Hopeless 1  PHQ - 2 Score 2  Altered sleeping 3  Tired, decreased energy 3  Change in appetite 1  Feeling bad or failure about yourself  1  Trouble concentrating 0  Moving slowly or fidgety/restless 0  Suicidal thoughts 0  PHQ-9 Score 10     General: well developed, well nourished, seated, in no evident distress Head: head normocephalic and atraumatic.   Neck: supple with no carotid or supraclavicular bruits Cardiovascular: regular rate and rhythm, no murmurs Musculoskeletal: no deformity Skin:  no rash/petichiae Vascular:  Normal pulses all extremities   Neurologic  Exam Mental Status: Awake and fully alert.  Fluent speech and language.  Oriented to place and time. Recent and remote memory intact. Attention span, concentration and fund of knowledge appropriate. Mood and affect appropriate.  Cranial Nerves: Fundoscopic exam reveals sharp disc margins. Pupils equal, briskly reactive to light. Extraocular movements full without nystagmus. Visual fields full to confrontation. Hearing intact. Facial sensation intact. Face, tongue, palate moves normally and symmetrically.  Motor: Normal bulk and tone. Normal strength in all tested extremity muscles with exception of 4-4+/5 of right lower extremity.  Sensory.: decreased to soft touch, temperature and  vibration of right upper and lower extremity.  Coordination: Rapid alternating movements normal in all extremities. Finger-to-nose and heel-to-shin performed accurately bilaterally. Gait and Station: Arises from chair without difficulty. Stance is normal. Gait demonstrates normal stride length, wide based, normal balance with no assistive device. Tandem walk and heel toe not performed, patient reports he can not do this test.  Reflexes: 1+ and symmetric.    NIHSS  1 Modified Rankin  1   ASSESSMENT: Alex [name] is a 51 y.o. year old male presented to the ER 11/17/2021 after feeling a pop in his head with lightheadedness, numbness and double vision. Vascular risk factors include HTN, tobacco use, drug use (methamphetamines and marijuana).    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 Continue aspirin 81 mg daily  and rosuvastatin 10mg  daily  for secondary stroke prevention.  Discussed secondary stroke prevention measures and importance of close PCP follow up for aggressive stroke risk factor management. I have gone over the pathophysiology of stroke, warning signs and symptoms, risk factors and their management in some  detail with instructions to go to the closest emergency room for symptoms of concern. HTN: BP goal <130/90.  Elevated today, 143/103. Continue amlodipine, Zestril, HCTZ, hydralazine per PCP. Close follow up with PCP.   HLD: LDL goal <70. Recent LDL 82. Continue rosuvastatin 10mg  daily per PCP.  DMII: A1c goal<7.0. Recent A1c 5.7.  Tobacco use: consider cessation.  ETOH: limit alcohol to no more than 1-2 a day.  Substance abuse: avoid illegal substances Obesity: Follow up with PCP for weight management discussion, healthy well balanced diet, regular exercise Right sided lower ext weakness: referral to PT, OT neuro rehab for evaluation and treatment Cognitive deficit: referral to ST with neuro rehab for eval and cognitive therapy. Left sided double vision: referral to opthalmology.    Follow up in 6 months or call earlier if needed   CC:  [facility] provider: Dr. Leonie Man PCP: Ladell Pier, MD    I spent 45 minutes of face-to-face and non-face-to-face time with patient.  This included previsit chart review including review of recent hospitalization, lab review, study review, order entry, electronic health record documentation, patient education regarding recent stroke including etiology, secondary stroke prevention measures and importance of managing stroke risk factors, residual deficits and typical recovery time and answered all other questions to patient satisfaction   Debbora Presto, [facility]  [facility] [address] [place],  [address]  Phone [phone] Fax [phone] Note: This document was prepared with digital dictation and possible smart phrase technology. Any transcriptional errors that result from this process are unintentional.

## 2022-07-17 ENCOUNTER — Ambulatory Visit: Payer: No Typology Code available for payment source | Admitting: Family Medicine

## 2022-07-17 ENCOUNTER — Encounter: Payer: Self-pay | Admitting: Family Medicine

## 2022-07-17 DIAGNOSIS — R4189 Other symptoms and signs involving cognitive functions and awareness: Secondary | ICD-10-CM

## 2022-07-17 DIAGNOSIS — I6302 Cerebral infarction due to thrombosis of basilar artery: Secondary | ICD-10-CM

## 2022-12-09 ENCOUNTER — Encounter (HOSPITAL_COMMUNITY): Payer: Self-pay

## 2022-12-09 ENCOUNTER — Emergency Department (HOSPITAL_COMMUNITY)
Admission: EM | Admit: 2022-12-09 | Discharge: 2022-12-09 | Disposition: A | Discharge status: Home / Self Care | Payer: No Typology Code available for payment source | Attending: Emergency Medicine | Admitting: Emergency Medicine

## 2022-12-09 ENCOUNTER — Other Ambulatory Visit: Payer: Self-pay

## 2022-12-09 DIAGNOSIS — Z7982 Long term (current) use of aspirin: Secondary | ICD-10-CM | POA: Insufficient documentation

## 2022-12-09 DIAGNOSIS — R600 Localized edema: Secondary | ICD-10-CM | POA: Diagnosis present

## 2022-12-09 HISTORY — DX: Cerebral infarction, unspecified: I63.9

## 2022-12-09 LAB — CBC WITH DIFFERENTIAL/PLATELET
Abs Immature Granulocytes: 0.08 10*3/uL — ABNORMAL HIGH (ref 0.00–0.07)
Basophils Absolute: 0.1 10*3/uL (ref 0.0–0.1)
Basophils Relative: 1 %
Eosinophils Absolute: 0.1 10*3/uL (ref 0.0–0.5)
Eosinophils Relative: 1 %
HCT: 37.8 % — ABNORMAL LOW (ref 39.0–52.0)
Hemoglobin: 12.8 g/dL — ABNORMAL LOW (ref 13.0–17.0)
Immature Granulocytes: 1 %
Lymphocytes Relative: 19 %
Lymphs Abs: 2 10*3/uL (ref 0.7–4.0)
MCH: 28.2 pg (ref 26.0–34.0)
MCHC: 33.9 g/dL (ref 30.0–36.0)
MCV: 83.3 fL (ref 80.0–100.0)
Monocytes Absolute: 0.9 10*3/uL (ref 0.1–1.0)
Monocytes Relative: 8 %
Neutro Abs: 7.7 10*3/uL (ref 1.7–7.7)
Neutrophils Relative %: 70 %
Platelets: 294 10*3/uL (ref 150–400)
RBC: 4.54 MIL/uL (ref 4.22–5.81)
RDW: 15.8 % — ABNORMAL HIGH (ref 11.5–15.5)
WBC: 10.8 10*3/uL — ABNORMAL HIGH (ref 4.0–10.5)
nRBC: 0 % (ref 0.0–0.2)

## 2022-12-09 LAB — COMPREHENSIVE METABOLIC PANEL
ALT: 31 U/L (ref 0–44)
AST: 23 U/L (ref 15–41)
Albumin: 2.9 g/dL — ABNORMAL LOW (ref 3.5–5.0)
Alkaline Phosphatase: 71 U/L (ref 38–126)
Anion gap: 12 (ref 5–15)
BUN: 9 mg/dL (ref 6–20)
CO2: 24 mmol/L (ref 22–32)
Calcium: 8.5 mg/dL — ABNORMAL LOW (ref 8.9–10.3)
Chloride: 98 mmol/L (ref 98–111)
Creatinine, Ser: 1.02 mg/dL (ref 0.61–1.24)
GFR, Estimated: >60 mL/min (ref >60)
Glucose, Bld: 154 mg/dL — ABNORMAL HIGH (ref 70–99)
Potassium: 3.6 mmol/L (ref 3.5–5.1)
Sodium: 134 mmol/L — ABNORMAL LOW (ref 135–145)
Total Bilirubin: 0.3 mg/dL (ref 0.3–1.2)
Total Protein: 7.2 g/dL (ref 6.5–8.1)

## 2022-12-09 LAB — BRAIN NATRIURETIC PEPTIDE: B Natriuretic Peptide: 24.5 pg/mL (ref 0.0–100.0)

## 2022-12-09 MED ORDER — CEPHALEXIN 500 MG PO CAPS: ORAL_CAPSULE | ORAL | 0 refills | Status: DC

## 2022-12-09 MED ORDER — FUROSEMIDE 20 MG PO TABS
40.0000 mg | ORAL_TABLET | Freq: Once | ORAL | Status: AC
Start: 2022-12-09 — End: 2022-12-09
  Administered 2022-12-09: 40 mg via ORAL
  Filled 2022-12-09: qty 2

## 2022-12-09 MED ORDER — POTASSIUM CHLORIDE CRYS ER 20 MEQ PO TBCR
40.0000 meq | EXTENDED_RELEASE_TABLET | Freq: Once | ORAL | Status: AC
  Administered 2022-12-09: 40 meq via ORAL
  Filled 2022-12-09: qty 2

## 2022-12-09 MED ORDER — FUROSEMIDE 20 MG PO TABS: 20.0000 mg | ORAL_TABLET | Freq: Every day | ORAL | 0 refills | Status: DC

## 2022-12-09 NOTE — ED Provider Notes (Signed)
[place] [facility] [place] [facility]   CSN: [id number] Arrival date & time: 12/09/22  1213     History  No chief complaint on file.   Alex [name] is a 52 y.o. male.  52 yo M with a chief complaints of bilateral lower extremity edema.  This been going on for some time now.  He has seen a PCP in the office for it previously but that was before it got much worse.  He has had periods where it has improved.  He denies any difficulty breathing.        Home Medications Prior to Admission medications   Medication Sig Start Date End Date Taking? Authorizing Provider  cephALEXin (KEFLEX) 500 MG capsule 2 caps po bid x 7 days 12/09/22  Yes Melene PlanFloyd, Krue Peterka, DO  furosemide (LASIX) 20 MG tablet Take 1 tablet (20 mg total) by mouth daily. 12/09/22  Yes Melene PlanFloyd, Ramya Vanbergen, DO  aspirin 81 MG EC tablet Take 1 tablet (81 mg total) by mouth daily. Swallow whole. 11/22/21   Danford, Earl Liteshristopher P, MD  hydrALAZINE (APRESOLINE) 50 MG tablet Take 1 tablet (50 mg total) by mouth 3 (three) times daily. 01/11/22   Marcine MatarJohnson, Deborah B, MD  hydrochlorothiazide (HYDRODIURIL) 25 MG tablet Take 1 tablet (25 mg total) by mouth daily. 11/22/21 12/22/21  Danford, Earl Liteshristopher P, MD  lisinopril (ZESTRIL) 10 MG tablet Take 1 tablet (10 mg total) by mouth daily. 02/10/22   Marcine MatarJohnson, Deborah B, MD  rosuvastatin (CRESTOR) 10 MG tablet Take 1 tablet (10 mg total) by mouth daily. 11/22/21   Danford, Earl Liteshristopher P, MD      Allergies    Patient has no known allergies.    Review of Systems   Review of Systems  Physical Exam Updated Vital Signs BP (!) 164/92   Pulse 90   Temp 97.8 F (36.6 C) (Oral)   Resp 18   Ht 5\' 11"  (1.803 m)   Wt 106.6 kg   SpO2 98%   BMI 32.78 kg/m  Physical Exam Vitals and nursing note reviewed.  Constitutional:      Appearance: He is well-developed.  HENT:     Head: Normocephalic and atraumatic.  Eyes:     Pupils: Pupils are equal, round, and reactive to light.  Neck:      Vascular: No JVD.  Cardiovascular:     Rate and Rhythm: Normal rate and regular rhythm.     Heart sounds: No murmur heard.    No friction rub. No gallop.  Pulmonary:     Effort: No respiratory distress.     Breath sounds: No wheezing.  Abdominal:     General: There is no distension.     Tenderness: There is no abdominal tenderness. There is no guarding or rebound.  Musculoskeletal:        General: Normal range of motion.     Cervical back: Normal range of motion and neck supple.     Right lower leg: Edema present.     Left lower leg: Edema present.     Comments: Bilateral lower extremity edema with signs of chronic venous stasis.  Mild erythema surrounding on the right.  Mildly warm.  Intact distal pulses motor and sensation bilaterally.  Skin:    Coloration: Skin is not pale.     Findings: No rash.  Neurological:     Mental Status: He is alert and oriented to person, place, and time.  Psychiatric:  Behavior: Behavior normal.     ED Results / Procedures / Treatments   Labs (all labs ordered are listed, but only abnormal results are displayed) Labs Reviewed  CBC WITH DIFFERENTIAL/PLATELET - Abnormal; Notable for the following components:      Result Value   WBC 10.8 (*)    Hemoglobin 12.8 (*)    HCT 37.8 (*)    RDW 15.8 (*)    Abs Immature Granulocytes 0.08 (*)    All other components within normal limits  COMPREHENSIVE METABOLIC PANEL - Abnormal; Notable for the following components:   Sodium 134 (*)    Glucose, Bld 154 (*)    Calcium 8.5 (*)    Albumin 2.9 (*)    All other components within normal limits  BRAIN NATRIURETIC PEPTIDE    EKG None  Radiology No results found.  Procedures Procedures    Medications Ordered in ED Medications  potassium chloride SA (KLOR-CON M) CR tablet 40 mEq (has no administration in time range)  furosemide (LASIX) tablet 40 mg (has no administration in time range)    ED Course/ Medical Decision Making/ A&P                              Medical Decision Making Amount and/or Complexity of Data Reviewed Labs: ordered.  Risk Prescription drug management.   52 yo M with a chief complaints of bilateral leg swelling.  This been going on for some time but worsening over the past month or so.  He has been seen for this at some point in his life but has been a while.  It also got much worse.  He has some signs of possible cellulitis to the right lower extremity.  Patient's blood work is largely unremarkable.  His sodium is mildly low, his potassium is normal but on the low end of normal.  Give the oral dose of Lasix.  Give a oral dose of potassium.  His BMP is normal.  No significant changes renal function.  Liver function tests are normal.  Will discharge the patient home.  PCP follow-up.  2:43 PM:  I have discussed the diagnosis/risks/treatment options with the patient.  Evaluation and diagnostic testing in the emergency department does not suggest an emergent condition requiring admission or immediate intervention beyond what has been performed at this time.  They will follow up with PCP. We also discussed returning to the ED immediately if new or worsening sx occur. We discussed the sx which are most concerning (e.g., sudden worsening pain, fever, inability to tolerate by mouth) that necessitate immediate return. Medications administered to the patient during their visit and any new prescriptions provided to the patient are listed below.  Medications given during this visit Medications  potassium chloride SA (KLOR-CON M) CR tablet 40 mEq (has no administration in time range)  furosemide (LASIX) tablet 40 mg (has no administration in time range)     The patient appears reasonably screen and/or stabilized for discharge and I doubt any other medical condition or other [facility] requiring further screening, evaluation, or treatment in the ED at this time prior to discharge.          Final Clinical  Impression(s) / ED Diagnoses Final diagnoses:  Bilateral leg edema    Rx / DC Orders ED Discharge Orders          Ordered    furosemide (LASIX) 20 MG tablet  Daily  12/09/22 1441    cephALEXin (KEFLEX) 500 MG capsule        12/09/22 1441              Melene Plan, DO 12/09/22 1444

## 2022-12-09 NOTE — ED Triage Notes (Signed)
Pt c/o bilateral leg swelling and pain x 5 months, fever, HA, generalized aches X  several days; purulent drainage, several sores on lower legs noted; hypertensive in triage, has not taken meds in several weeks

## 2022-12-09 NOTE — Discharge Instructions (Addendum)
Please follow-up with your family doctor in the office.  Try to keep your legs elevated whenever you are not up and moving around.  These need to be higher than you expect them to be higher than the level of your heart.  I have started you on antibiotics in case there is some overlying infection.  Your blood work looked good.  Your potassium level was normal but was on the low end of normal.  The way that the fluid pills were because they use potassium to make you dumped the fluid out and so you need to try and increase your dietary potassium at home.  I would have you pick something that is high in potassium and have it with each meal at least until you are done taking your diuretic.  Please follow-up with a family doctor in the office.  They may need to refer you for further workup for this.  Please return to the Emergency Department for rapid spreading redness and get up with fever or start having difficulty breathing especially with lying flat.

## 2023-01-08 IMAGING — MR MR HEAD W/O CM
10 series · 48 of 48 positions shown · non-contrast
Comparison: Same day CT head.  MRI January 01, 2021.

CLINICAL DATA: Neuro deficit, acute, stroke suspected

EXAM:
MRI HEAD WITHOUT CONTRAST
TECHNIQUE: Multiplanar, multiecho pulse sequences of the brain and surrounding
structures were obtained without intravenous contrast.

[Series 5: DWI · axial · 3.0mm · 1.36mm/px · z∈[-73,+92]mm · 9 of 112 slices shown (1 of 2)]
[im 1/112]
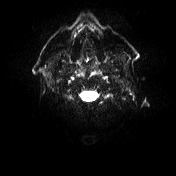
[im 14/112]
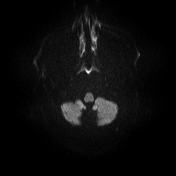
[im 28/112]
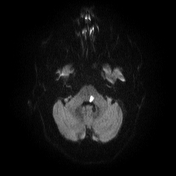
[im 42/112]
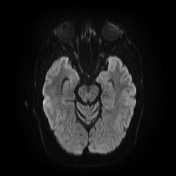
[im 56/112]
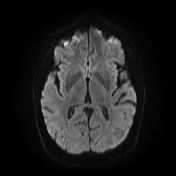
[im 70/112]
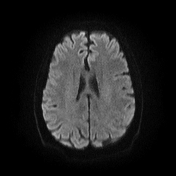
[im 84/112]
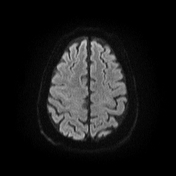
[im 98/112]
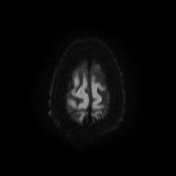
[im 112/112]
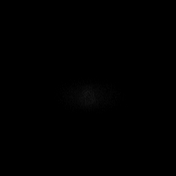

[Series 6: DWI · axial · 3.0mm · 1.36mm/px · z∈[-73,+92]mm · 4 of 56 slices shown (2 of 2)]
[im 1/56]
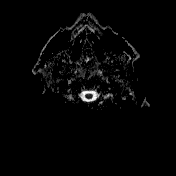
[im 19/56]
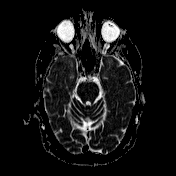
[im 37/56]
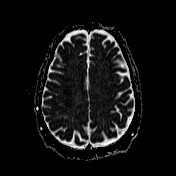
[im 56/56]
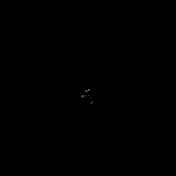

[Series 7: T1 · sagittal · 5.0mm · 0.75mm/px · 2 of 26 slices shown (1 of 2)]
[im 1/26]
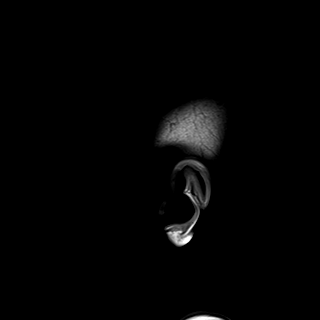
[im 26/26]
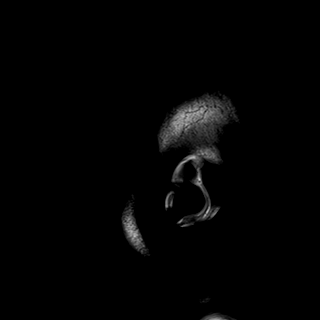

[Series 8: T2 · axial · 5.0mm · 0.62mm/px · z∈[-72,+90]mm · 2 of 26 slices shown (1 of 2)]
[im 1/26]
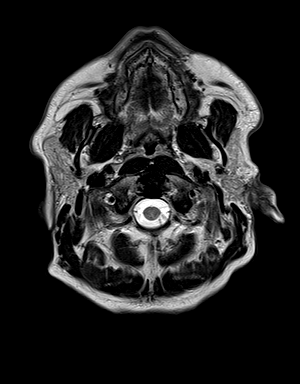
[im 26/26]
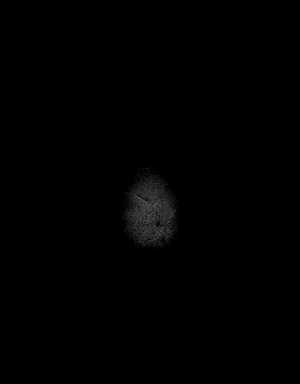

[Series 9: swi_images · axial · 3.0mm · 0.75mm/px · z∈[-74,+91]mm · 4 of 56 slices shown]
[im 1/56]
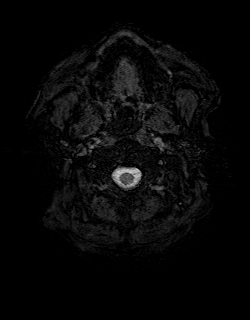
[im 19/56]
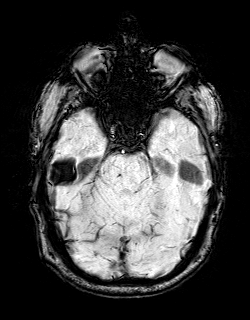
[im 37/56]
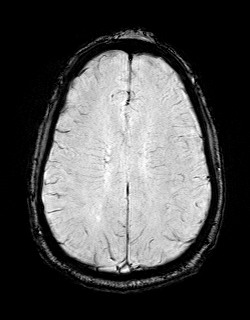
[im 56/56]
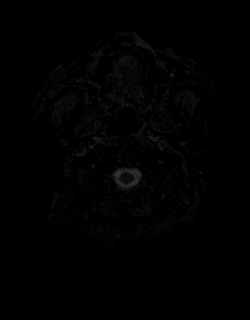

[Series 11: FLAIR · axial · 3.0mm · 0.75mm/px · z∈[-74,+91]mm · 4 of 56 slices shown]
[im 1/56]
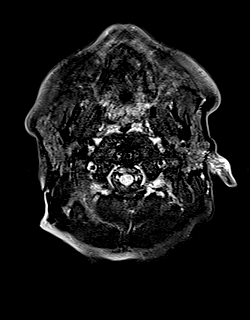
[im 19/56]
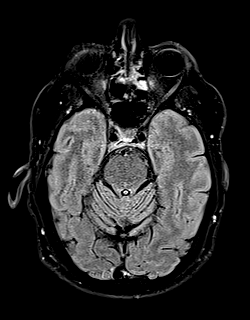
[im 37/56]
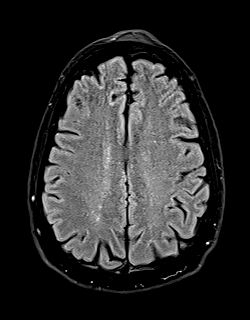
[im 56/56]
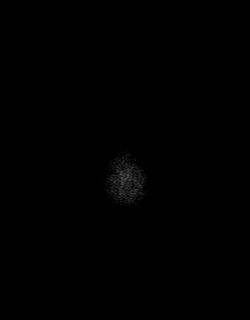

[Series 12: T1 · axial · 1.0mm · 0.94mm/px · z∈[-79,+96]mm · 13 of 176 slices shown (2 of 2)]
[im 1/176]
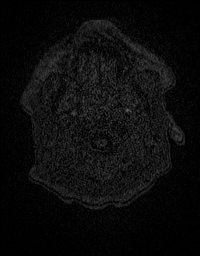
[im 15/176]
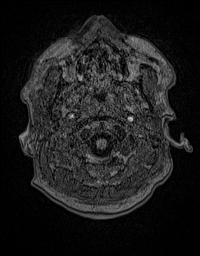
[im 30/176]
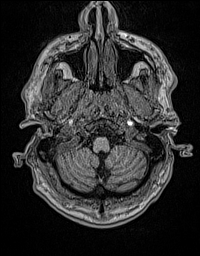
[im 44/176]
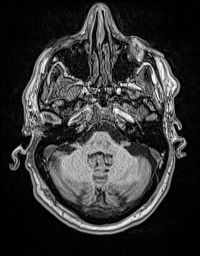
[im 59/176]
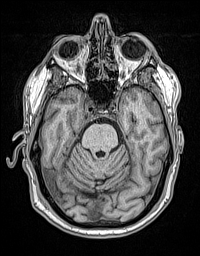
[im 73/176]
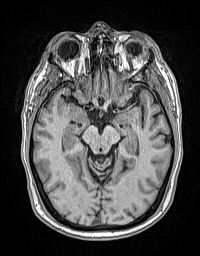
[im 88/176]
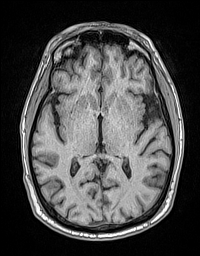
[im 103/176]
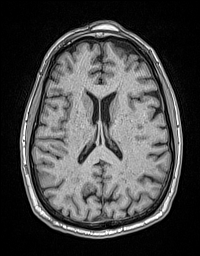
[im 117/176]
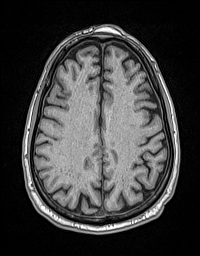
[im 132/176]
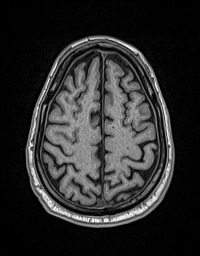
[im 146/176]
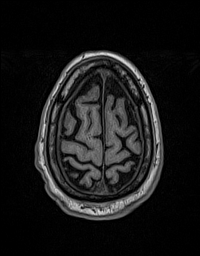
[im 161/176]
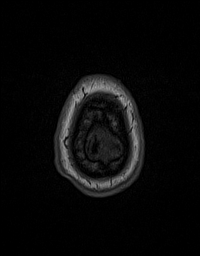
[im 176/176]
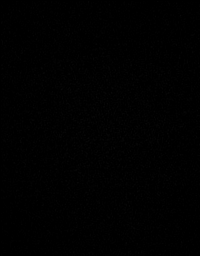

[Series 13: cor dwi_tracew · coronal · 5.0mm · 1.53mm/px · 5 of 62 slices shown]
[im 1/62]
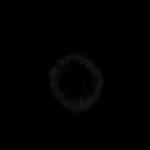
[im 16/62]
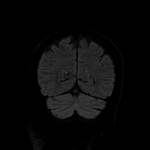
[im 31/62]
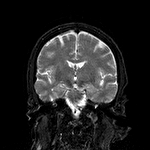
[im 46/62]
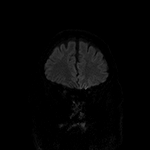
[im 62/62]
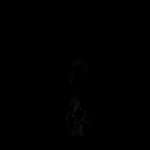

[Series 14: cor dwi_adc · coronal · 5.0mm · 1.53mm/px · 2 of 30 slices shown]
[im 1/30]
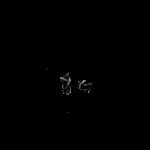
[im 30/30]
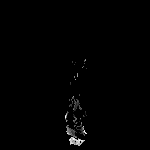

[Series 16: T2 · coronal · 5.0mm · 0.57mm/px · 3 of 39 slices shown (2 of 2)]
[im 1/39]
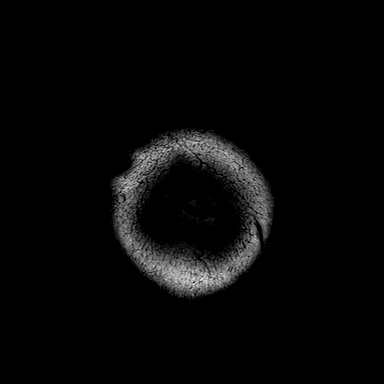
[im 20/39]
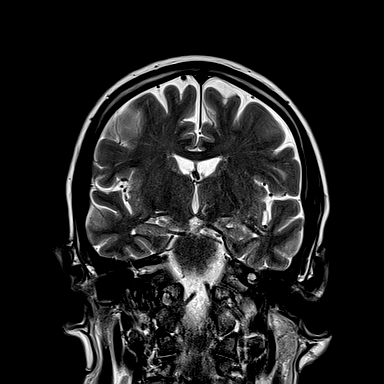
[im 39/39]
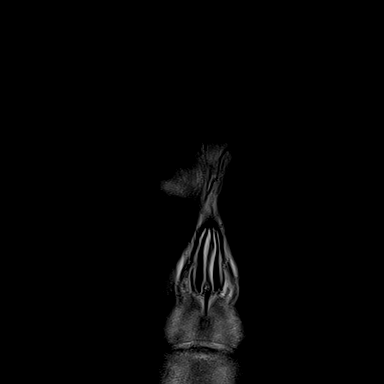

[48 of 48 positions shown; findings below may reference images not displayed]

FINDINGS: Brain: Small acute infarct in the posterior and inferior left pons
(series 5, image 70). Associated edema without mass effect.
Additional scattered mild T2/FLAIR hyperintensities in the white
matter nonspecific but compatible with chronic microvascular
disease. No evidence of acute hemorrhage, mass lesion, midline
shift, hydrocephalus or extra-axial fluid collection.

Vascular: Major arterial flow voids are maintained at the skull
base.

Skull and upper cervical spine: Normal marrow signal. Benign
forehead scalp lipoma.

Sinuses/Orbits: Mild paranasal sinus mucosal thickening with
maxillary sinus retention cyst. No acute orbital findings.

Other: No significant mastoid effusions.
IMPRESSION: 1. Small acute infarct in the posterior and inferior left pons.
Associated edema without mass effect.
2. Mild chronic microvascular disease.

## 2023-01-08 IMAGING — CT CT HEAD W/O CM
3 series · 15 of 47 positions shown, 18 images · non-contrast
Comparison: Head CT and brain MRI 01/01/2021

CLINICAL DATA: Transient ischemic attack.



[Series 2: head wo · axial · 0.46mm/px · z∈[+1415,+1550]mm · 9 of 33 slices shown, 12 images]
[im 3/33  brain]
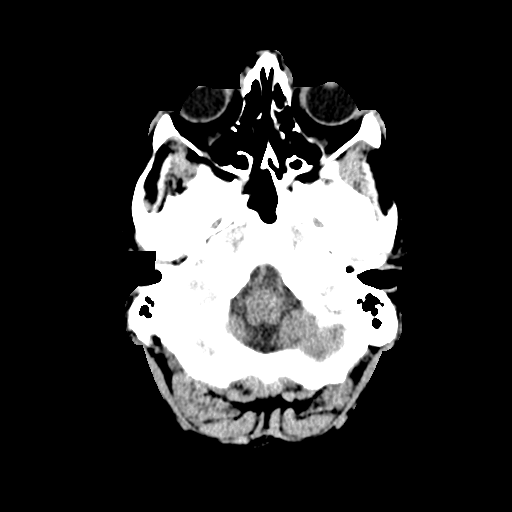
[im 3/33  bone]
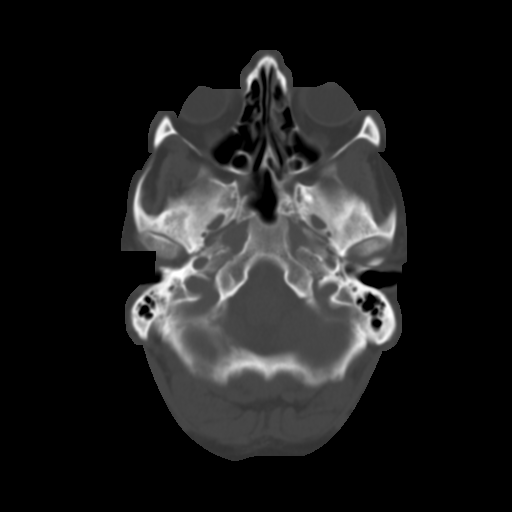
[im 6/33  brain]
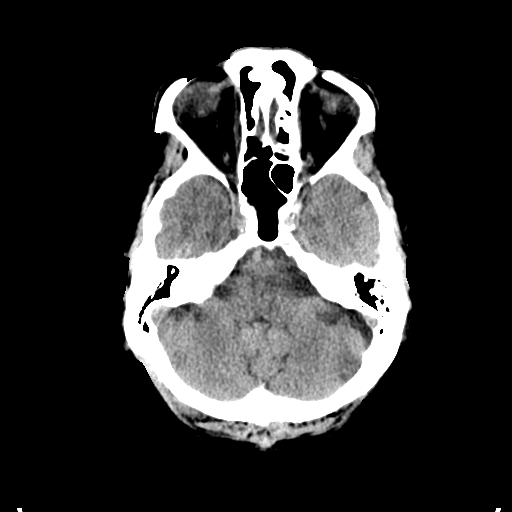
[im 9/33  brain]
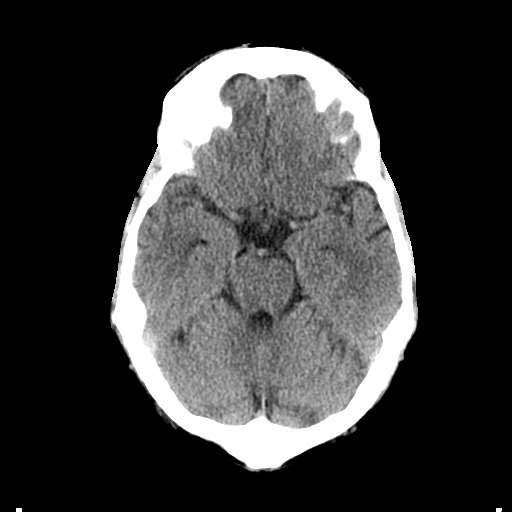
[im 13/33  brain]
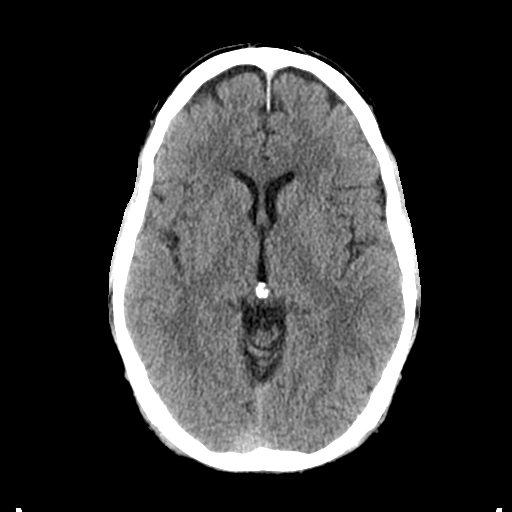
[im 17/33  brain]
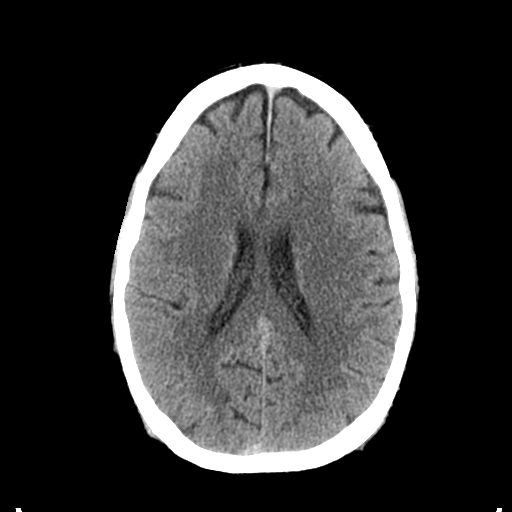
[im 17/33  bone]
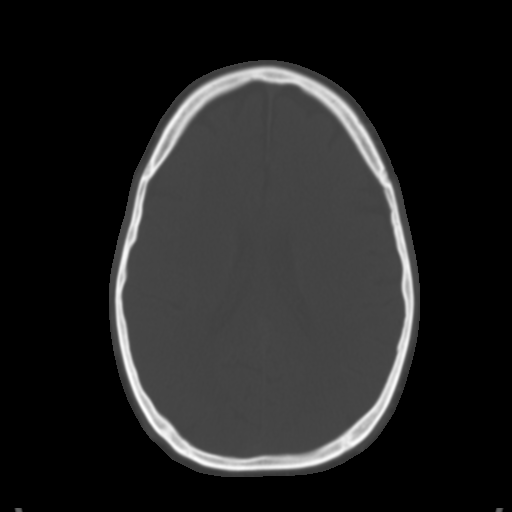
[im 20/33  brain]
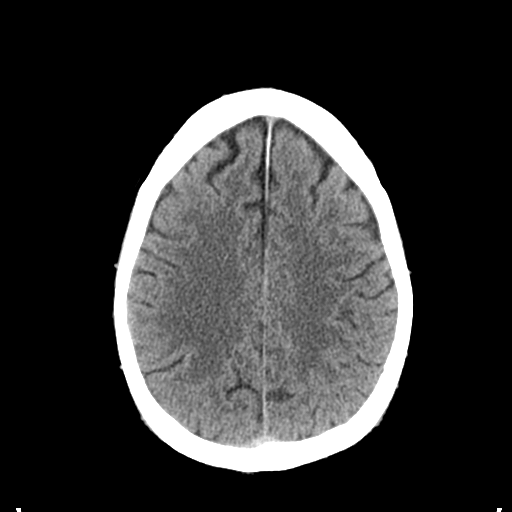
[im 24/33  brain]
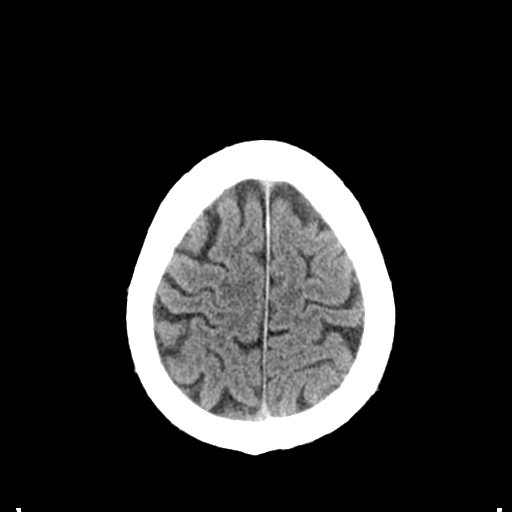
[im 27/33  brain]
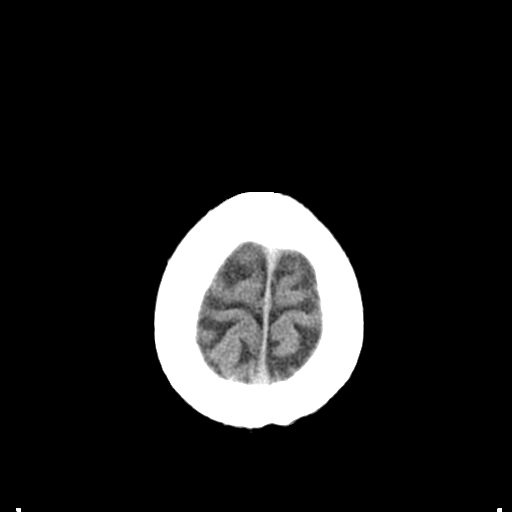
[im 30/33  brain]
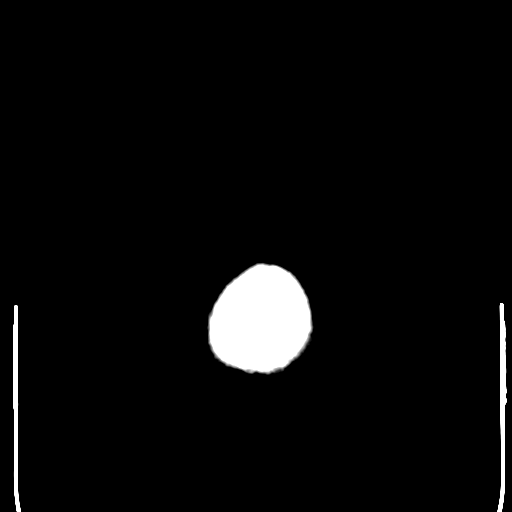
[im 30/33  bone]
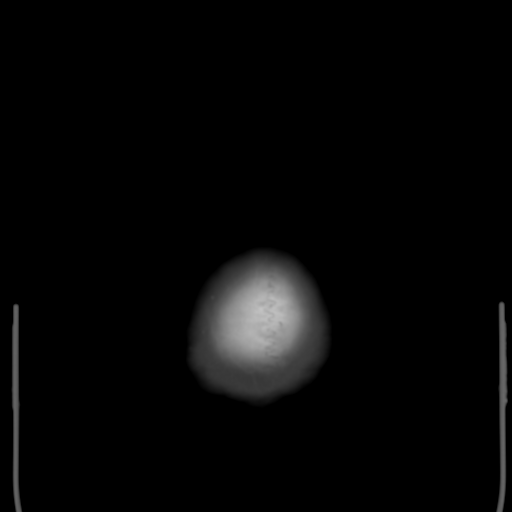

[Series 3: coronal soft tissue · coronal · 0.33mm/px · 3 of 73 slices shown]
[im 25/73  brain]
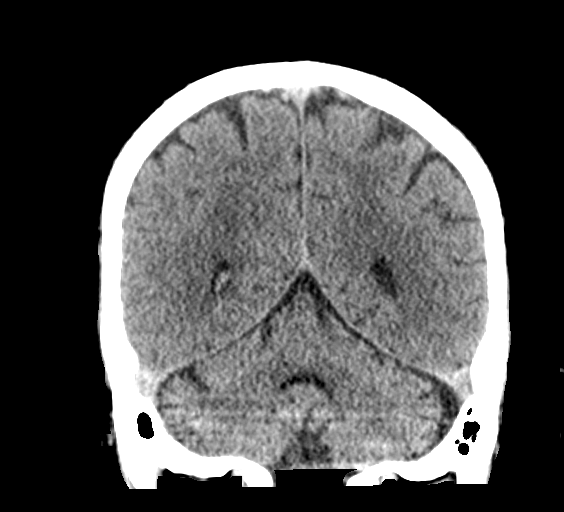
[im 33/73  brain]
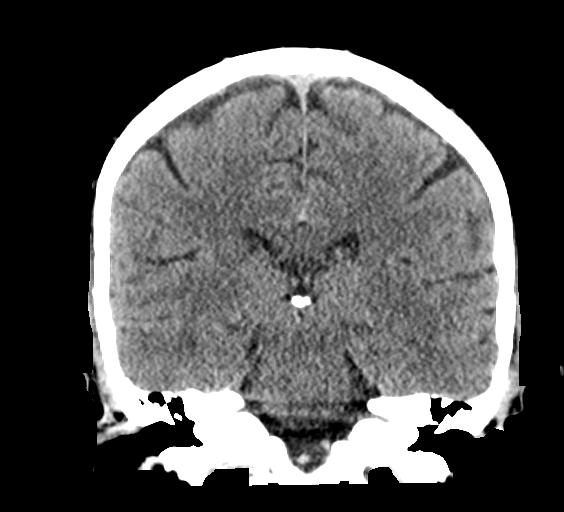
[im 41/73  brain]
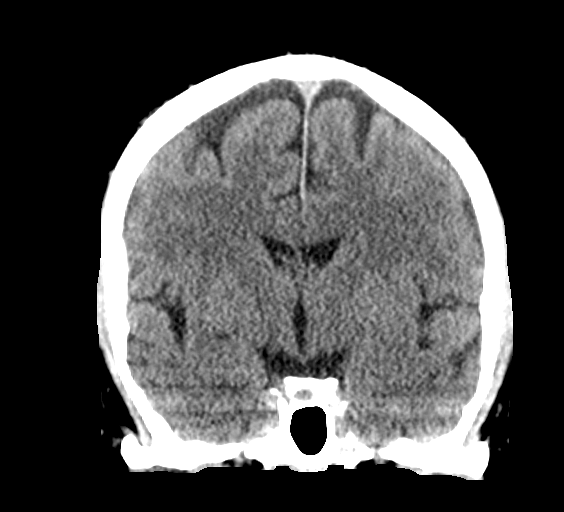

[Series 4: sagittal soft tissue · sagittal · 0.32mm/px · 3 of 57 slices shown]
[im 19/57  brain]
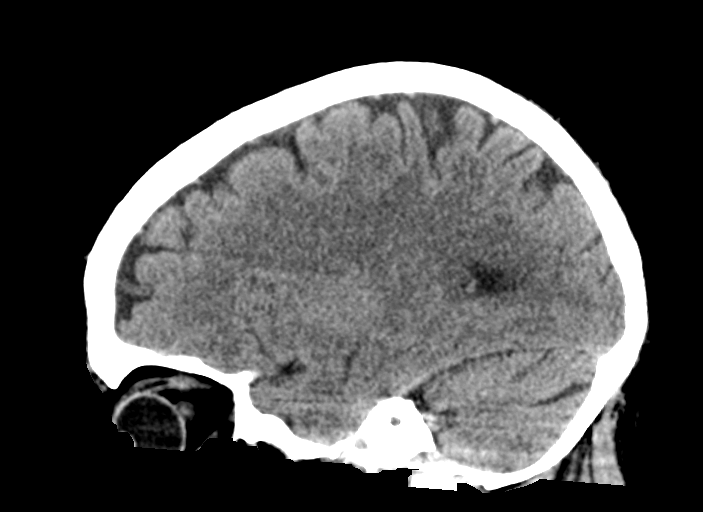
[im 29/57  brain]
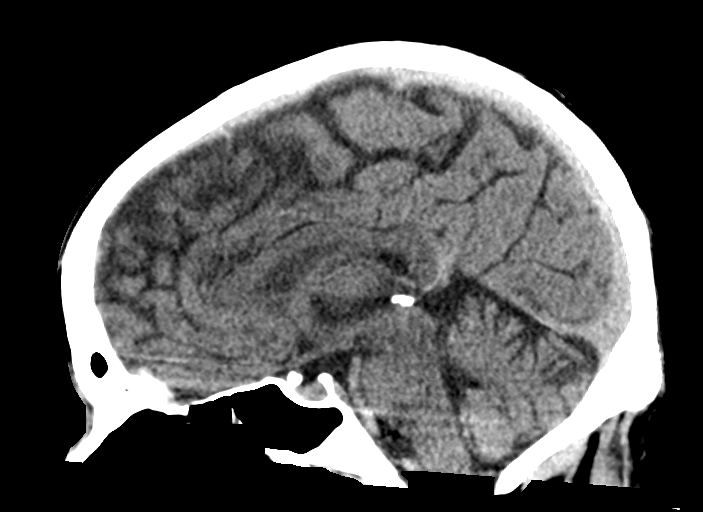
[im 38/57  brain]
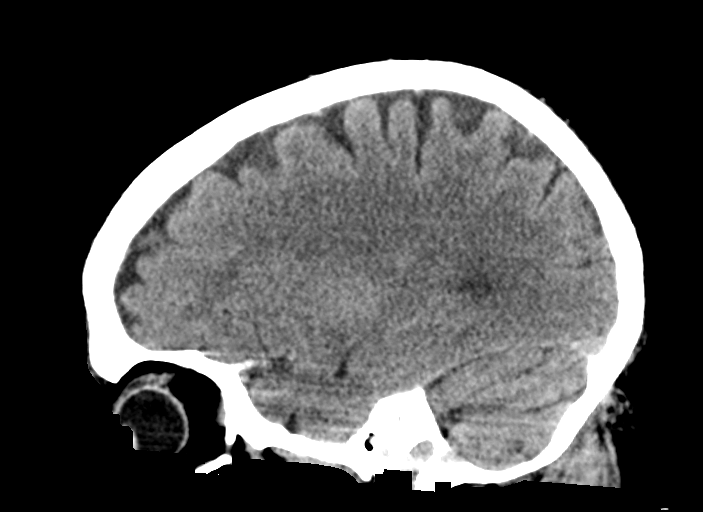

[15 of 47 positions shown; findings below may reference images not displayed]

FINDINGS: Brain: No intracranial hemorrhage, mass effect, or midline shift. No
hydrocephalus. The basilar cisterns are patent. No evidence of
territorial infarct or acute ischemia. No extra-axial or
intracranial fluid collection.

Vascular: Atherosclerosis of skullbase vasculature without
hyperdense vessel or abnormal calcification.

Skull: No fracture or focal lesion.

Sinuses/Orbits: Mucosal thickening of ethmoid air cells. Mastoid air
cells are clear. Unremarkable orbits.

Other: Midline frontal scalp lipoma.  No internal complexity.
IMPRESSION: No acute intracranial abnormality.

## 2023-05-21 ENCOUNTER — Observation Stay (HOSPITAL_COMMUNITY)
Admission: EM | Admit: 2023-05-21 | Discharge: 2023-05-22 | Disposition: A | Discharge status: Home / Self Care | Payer: No Typology Code available for payment source | Attending: Internal Medicine | Admitting: Internal Medicine

## 2023-05-21 ENCOUNTER — Encounter (HOSPITAL_COMMUNITY): Payer: Self-pay

## 2023-05-21 ENCOUNTER — Emergency Department (HOSPITAL_COMMUNITY): Payer: No Typology Code available for payment source

## 2023-05-21 ENCOUNTER — Other Ambulatory Visit: Payer: Self-pay

## 2023-05-21 DIAGNOSIS — Z79899 Other long term (current) drug therapy: Secondary | ICD-10-CM | POA: Insufficient documentation

## 2023-05-21 DIAGNOSIS — D72829 Elevated white blood cell count, unspecified: Secondary | ICD-10-CM | POA: Insufficient documentation

## 2023-05-21 DIAGNOSIS — F17210 Nicotine dependence, cigarettes, uncomplicated: Secondary | ICD-10-CM | POA: Insufficient documentation

## 2023-05-21 DIAGNOSIS — L03115 Cellulitis of right lower limb: Principal | ICD-10-CM | POA: Insufficient documentation

## 2023-05-21 DIAGNOSIS — I10 Essential (primary) hypertension: Secondary | ICD-10-CM | POA: Insufficient documentation

## 2023-05-21 DIAGNOSIS — R600 Localized edema: Principal | ICD-10-CM

## 2023-05-21 DIAGNOSIS — J449 Chronic obstructive pulmonary disease, unspecified: Secondary | ICD-10-CM | POA: Insufficient documentation

## 2023-05-21 DIAGNOSIS — I159 Secondary hypertension, unspecified: Secondary | ICD-10-CM

## 2023-05-21 DIAGNOSIS — M79661 Pain in right lower leg: Secondary | ICD-10-CM | POA: Diagnosis present

## 2023-05-21 DIAGNOSIS — R21 Rash and other nonspecific skin eruption: Secondary | ICD-10-CM

## 2023-05-21 DIAGNOSIS — L0390 Cellulitis, unspecified: Secondary | ICD-10-CM | POA: Diagnosis present

## 2023-05-21 DIAGNOSIS — Z7982 Long term (current) use of aspirin: Secondary | ICD-10-CM | POA: Insufficient documentation

## 2023-05-21 DIAGNOSIS — Z8673 Personal history of transient ischemic attack (TIA), and cerebral infarction without residual deficits: Secondary | ICD-10-CM | POA: Diagnosis not present

## 2023-05-21 DIAGNOSIS — I872 Venous insufficiency (chronic) (peripheral): Secondary | ICD-10-CM | POA: Diagnosis not present

## 2023-05-21 LAB — CBC WITH DIFFERENTIAL/PLATELET
Abs Immature Granulocytes: 0.14 10*3/uL — ABNORMAL HIGH (ref 0.00–0.07)
Basophils Absolute: 0.1 10*3/uL (ref 0.0–0.1)
Basophils Relative: 1 %
Eosinophils Absolute: 0.2 10*3/uL (ref 0.0–0.5)
Eosinophils Relative: 1 %
HCT: 41 % (ref 39.0–52.0)
Hemoglobin: 13 g/dL (ref 13.0–17.0)
Immature Granulocytes: 1 %
Lymphocytes Relative: 20 %
Lymphs Abs: 2.7 10*3/uL (ref 0.7–4.0)
MCH: 27 pg (ref 26.0–34.0)
MCHC: 31.7 g/dL (ref 30.0–36.0)
MCV: 85.2 fL (ref 80.0–100.0)
Monocytes Absolute: 1.1 10*3/uL — ABNORMAL HIGH (ref 0.1–1.0)
Monocytes Relative: 8 %
Neutro Abs: 9.2 10*3/uL — ABNORMAL HIGH (ref 1.7–7.7)
Neutrophils Relative %: 69 %
Platelets: 331 10*3/uL (ref 150–400)
RBC: 4.81 MIL/uL (ref 4.22–5.81)
RDW: 15 % (ref 11.5–15.5)
WBC: 13.4 10*3/uL — ABNORMAL HIGH (ref 4.0–10.5)
nRBC: 0 % (ref 0.0–0.2)

## 2023-05-21 LAB — URINALYSIS, W/ REFLEX TO CULTURE (INFECTION SUSPECTED)
Bilirubin Urine: NEGATIVE
Glucose, UA: NEGATIVE mg/dL
Hgb urine dipstick: NEGATIVE
Ketones, ur: NEGATIVE mg/dL
Leukocytes,Ua: NEGATIVE
Nitrite: NEGATIVE
Protein, ur: NEGATIVE mg/dL
Specific Gravity, Urine: 1.014 (ref 1.005–1.030)
pH: 6 (ref 5.0–8.0)

## 2023-05-21 LAB — COMPREHENSIVE METABOLIC PANEL
ALT: 34 U/L (ref 0–44)
AST: 28 U/L (ref 15–41)
Albumin: 3.1 g/dL — ABNORMAL LOW (ref 3.5–5.0)
Alkaline Phosphatase: 73 U/L (ref 38–126)
Anion gap: 9 (ref 5–15)
BUN: 6 mg/dL (ref 6–20)
CO2: 29 mmol/L (ref 22–32)
Calcium: 8.6 mg/dL — ABNORMAL LOW (ref 8.9–10.3)
Chloride: 97 mmol/L — ABNORMAL LOW (ref 98–111)
Creatinine, Ser: 1 mg/dL (ref 0.61–1.24)
GFR, Estimated: >60 mL/min (ref >60)
Glucose, Bld: 155 mg/dL — ABNORMAL HIGH (ref 70–99)
Potassium: 3.4 mmol/L — ABNORMAL LOW (ref 3.5–5.1)
Sodium: 135 mmol/L (ref 135–145)
Total Bilirubin: 0.5 mg/dL (ref 0.3–1.2)
Total Protein: 7.7 g/dL (ref 6.5–8.1)

## 2023-05-21 LAB — TROPONIN I (HIGH SENSITIVITY)
Troponin I (High Sensitivity): 17 ng/L (ref <18)
Troponin I (High Sensitivity): 18 ng/L — ABNORMAL HIGH (ref <18)

## 2023-05-21 LAB — BRAIN NATRIURETIC PEPTIDE: B Natriuretic Peptide: 45.5 pg/mL (ref 0.0–100.0)

## 2023-05-21 LAB — I-STAT CG4 LACTIC ACID, ED
Lactic Acid, Venous: 1.3 mmol/L (ref 0.5–1.9)
Lactic Acid, Venous: 1.6 mmol/L (ref 0.5–1.9)

## 2023-05-21 MED ORDER — HYDRALAZINE HCL 50 MG PO TABS
50.0000 mg | ORAL_TABLET | Freq: Three times a day (TID) | ORAL | Status: DC
  Administered 2023-05-21 – 2023-05-22 (×4): 50 mg via ORAL
  Filled 2023-05-21 (×4): qty 1

## 2023-05-21 MED ORDER — POTASSIUM CHLORIDE 20 MEQ PO PACK
20.0000 meq | PACK | Freq: Once | ORAL | Status: AC
  Administered 2023-05-21: 20 meq via ORAL
  Filled 2023-05-21: qty 1

## 2023-05-21 MED ORDER — UMECLIDINIUM BROMIDE 62.5 MCG/ACT IN AEPB
1.0000 | INHALATION_SPRAY | Freq: Every day | RESPIRATORY_TRACT | Status: DC
  Filled 2023-05-21: qty 7

## 2023-05-21 MED ORDER — TIOTROPIUM BROMIDE MONOHYDRATE 2.5 MCG/ACT IN AERS: 1.0000 | INHALATION_SPRAY | Freq: Every day | RESPIRATORY_TRACT | Status: DC

## 2023-05-21 MED ORDER — HYDROCHLOROTHIAZIDE 12.5 MG PO TABS: 25.0000 mg | ORAL_TABLET | Freq: Every day | ORAL | Status: DC

## 2023-05-21 MED ORDER — VANCOMYCIN HCL IN DEXTROSE 1-5 GM/200ML-% IV SOLN: 1000.0000 mg | Freq: Once | INTRAVENOUS | Status: DC

## 2023-05-21 MED ORDER — HYDROCHLOROTHIAZIDE 12.5 MG PO TABS
25.0000 mg | ORAL_TABLET | Freq: Every day | ORAL | Status: DC
  Administered 2023-05-22: 25 mg via ORAL
  Filled 2023-05-21: qty 2

## 2023-05-21 MED ORDER — NICOTINE 21 MG/24HR TD PT24
21.0000 mg | MEDICATED_PATCH | Freq: Every day | TRANSDERMAL | Status: DC
  Administered 2023-05-21 – 2023-05-22 (×2): 21 mg via TRANSDERMAL
  Filled 2023-05-21 (×2): qty 1

## 2023-05-21 MED ORDER — OXYCODONE HCL 5 MG PO TABS
5.0000 mg | ORAL_TABLET | Freq: Three times a day (TID) | ORAL | Status: DC | PRN
  Administered 2023-05-21: 5 mg via ORAL
  Filled 2023-05-21: qty 1

## 2023-05-21 MED ORDER — ACETAMINOPHEN 500 MG PO TABS
1000.0000 mg | ORAL_TABLET | Freq: Four times a day (QID) | ORAL | Status: DC
  Administered 2023-05-21 – 2023-05-22 (×4): 1000 mg via ORAL
  Filled 2023-05-21 (×5): qty 2

## 2023-05-21 MED ORDER — SODIUM CHLORIDE 0.9 % IV SOLN
2.0000 g | INTRAVENOUS | Status: DC
  Administered 2023-05-21: 2 g via INTRAVENOUS
  Filled 2023-05-21: qty 20

## 2023-05-21 MED ORDER — VANCOMYCIN HCL 1500 MG/300ML IV SOLN
1500.0000 mg | Freq: Once | INTRAVENOUS | Status: DC
  Filled 2023-05-21: qty 300

## 2023-05-21 MED ORDER — AMLODIPINE BESYLATE 10 MG PO TABS: 10.0000 mg | ORAL_TABLET | Freq: Every day | ORAL | Status: DC

## 2023-05-21 MED ORDER — ROSUVASTATIN CALCIUM 5 MG PO TABS
10.0000 mg | ORAL_TABLET | Freq: Every day | ORAL | Status: DC
  Administered 2023-05-22: 10 mg via ORAL
  Filled 2023-05-21: qty 2

## 2023-05-21 MED ORDER — AMLODIPINE BESYLATE 10 MG PO TABS
10.0000 mg | ORAL_TABLET | Freq: Every day | ORAL | Status: DC
  Administered 2023-05-22: 10 mg via ORAL
  Filled 2023-05-21: qty 1

## 2023-05-21 MED ORDER — ASPIRIN 81 MG PO TBEC
81.0000 mg | DELAYED_RELEASE_TABLET | Freq: Every day | ORAL | Status: DC
  Administered 2023-05-22: 81 mg via ORAL
  Filled 2023-05-21: qty 1

## 2023-05-21 MED ORDER — IBUPROFEN 200 MG PO TABS
800.0000 mg | ORAL_TABLET | Freq: Three times a day (TID) | ORAL | Status: DC
  Administered 2023-05-21 – 2023-05-22 (×3): 800 mg via ORAL
  Filled 2023-05-21 (×3): qty 4

## 2023-05-21 MED ORDER — SODIUM CHLORIDE 0.9 % IV SOLN
2.0000 g | Freq: Once | INTRAVENOUS | Status: DC
  Administered 2023-05-21: 2 g via INTRAVENOUS
  Filled 2023-05-21: qty 12.5

## 2023-05-21 MED ORDER — RIVAROXABAN 10 MG PO TABS
10.0000 mg | ORAL_TABLET | Freq: Every day | ORAL | Status: DC
  Administered 2023-05-21 – 2023-05-22 (×2): 10 mg via ORAL
  Filled 2023-05-21 (×2): qty 1

## 2023-05-21 NOTE — ED Provider Notes (Addendum)
[place] [facility]MENT AT [facility] Provider Note   CSN: [id number] Arrival date & time: 05/21/23  1033     History  Chief Complaint  Patient presents with   Cellulitis   HPI Alex [name] is a 52 y.o. male with history of CVA, hypertension, recent history of bilateral lower extremity edema presenting for concern for cellulitis.  States he has had swelling and a rash now forming on both of his lower legs.  Symptoms started 2 months ago.  States now the rash on the calf of the right leg is weeping and draining pustulant discharge.  Endorses associated malodorous smell.  Also endorses chills at home but no fever.  Denies nausea vomiting diarrhea.  [facility] and [facility] was concern for [facility]ulitis and possible osteomyelitis in the right lower extremity.  Advised him to come here for further evaluation.  Patient also states that he is short of breath but denies chest pain.  States that he does have COPD and is "always short of breath".  HPI     Home Medications Prior to Admission medications   Medication Sig Start Date End Date Taking? Authorizing Provider  aspirin 81 MG EC tablet Take 1 tablet (81 mg total) by mouth daily. Swallow whole. 11/22/21   Danford, Earl Lites, MD  cephALEXin (KEFLEX) 500 MG capsule 2 caps po bid x 7 days 12/09/22   Melene Plan, DO  furosemide (LASIX) 20 MG tablet Take 1 tablet (20 mg total) by mouth daily. 12/09/22   Melene Plan, DO  hydrALAZINE (APRESOLINE) 50 MG tablet Take 1 tablet (50 mg total) by mouth 3 (three) times daily. 01/11/22   Marcine Matar, MD  hydrochlorothiazide (HYDRODIURIL) 25 MG tablet Take 1 tablet (25 mg total) by mouth daily. 11/22/21 12/22/21  Danford, Earl Lites, MD  lisinopril (ZESTRIL) 10 MG tablet Take 1 tablet (10 mg total) by mouth daily. 02/10/22   Marcine Matar, MD  rosuvastatin (CRESTOR) 10 MG tablet Take 1 tablet (10 mg total) by mouth daily. 11/22/21   Danford, Earl Lites, MD       Allergies    Patient has no known allergies.    Review of Systems   See HPI for pertinent positives   Physical Exam   Vitals:   05/21/23 1041  BP: (!) 173/90  Pulse: 92  Resp: 17  Temp: 98.1 F (36.7 C)  SpO2: 94%    CONSTITUTIONAL:  well-appearing, NAD NEURO:  Alert and oriented x 3, CN 3-12 grossly intact EYES:  eyes equal and reactive ENT/NECK:  Supple, no stridor  CARDIO:  regular rhythm, appears well-perfused  PULM:  No respiratory distress, coarse sounds in the bases of both lungs but otherwise normal air entry GI/GU:  non-distended MSK/SPINE: 2+ pitting edema noted around both ankles extending up word to the knee.  Erythema noted on both lower legs.  Right leg with erythematous rash, malodorous with pustulant weeping. Both legs warm but not hot to touch.          SKIN:  see pictures above   *Additional and/or pertinent findings included in MDM below    ED Results / Procedures / Treatments   Labs (all labs ordered are listed, but only abnormal results are displayed) Labs Reviewed  COMPREHENSIVE METABOLIC PANEL - Abnormal; Notable for the following components:      Result Value   Potassium 3.4 (*)    Chloride 97 (*)    Glucose, Bld 155 (*)  Calcium 8.6 (*)    Albumin 3.1 (*)    All other components within normal limits  CBC WITH DIFFERENTIAL/PLATELET - Abnormal; Notable for the following components:   WBC 13.4 (*)    Neutro Abs 9.2 (*)    Monocytes Absolute 1.1 (*)    Abs Immature Granulocytes 0.14 (*)    All other components within normal limits  URINALYSIS, W/ REFLEX TO CULTURE (INFECTION SUSPECTED) - Abnormal; Notable for the following components:   Bacteria, UA RARE (*)    All other components within normal limits  CULTURE, BLOOD (ROUTINE X 2)  CULTURE, BLOOD (ROUTINE X 2)  BRAIN NATRIURETIC PEPTIDE  I-STAT CG4 LACTIC ACID, ED  I-STAT CG4 LACTIC ACID, ED  TROPONIN I (HIGH SENSITIVITY)    EKG None  Radiology No results  found.  Procedures Procedures    Medications Ordered in ED Medications  ceFEPIme (MAXIPIME) 2 g in sodium chloride 0.9 % 100 mL IVPB (has no administration in time range)  vancomycin (VANCOREADY) IVPB 1500 mg/300 mL (has no administration in time range)    Followed by  vancomycin (VANCOCIN) IVPB 1000 mg/200 mL premix (has no administration in time range)    ED Course/ Medical Decision Making/ A&P                                 Medical Decision Making Amount and/or Complexity of Data Reviewed Labs: ordered. Radiology: ordered.  Risk Prescription drug management. Decision regarding hospitalization.   Initial Impression and Ddx 51 year old well-appearing male presenting for lower extremity edema and rash.  Exam notable for what appears to be a cellulitic lesion in the back of his right calf and extensive lower extremity edema in both legs.  DDx includes cellulitis, osteomyelitis, CHF, ACS, sepsis, bacteremia, other. Patient PMH that increases complexity of ED encounter:  CVA, hypertension, recent history of bilateral lower extremity edema   Interpretation of Diagnostics - I independent reviewed and interpreted the labs as followed: Leukocytosis  - Xrays pending  -I personally reviewed and interpreted EKG which revealed normal sinus rhythm. Patient Reassessment and Ultimate Disposition/Management Workup in exam findings most suggestive of of cellulitis primarily in the right mid calf.  Started on IV Vanco and cefepime.  Have low suspicion for CHF and ACS but given his lower extremity edema and shortness of breath sent troponin, BNP and EKG.  EKG reassuring.  Labs for shortness of breath workup and sepsis rule out are still pending. Admitted to hospital service for cellulitis with Dr. Lafonda Mosses.  Patient management required discussion with the following services or consulting groups:  Hospitalist Service  Complexity of Problems Addressed Acute complicated illness or  Injury  Additional Data Reviewed and Analyzed Further history obtained from: Past medical history and medications listed in the EMR, Prior ED visit notes, and Recent discharge summary  Patient Encounter Risk Assessment Consideration of hospitalization    Final Clinical Impression(s) / ED Diagnoses Final diagnoses:  Lower extremity edema  Rash    Rx / DC Orders ED Discharge Orders     None          Gareth Eagle, PA-C 05/21/23 1342    Gareth Eagle, PA-C 05/21/23 1344    Wynetta Fines, MD 05/21/23 [phone]

## 2023-05-21 NOTE — Plan of Care (Signed)
Problem: Health Behavior/Discharge Planning: Goal: Ability to manage health-related needs will improve Outcome: Progressing  Pt admitted into the hospital for cellulitis of RLE which has been ongoing for 2 months.  He has also been endorsing c/o drainage, odor, and pain.  MD are suspecting possible osteomyelitis.  He is on IV abx per MD's orders.     Problem: Clinical Measurements: Goal: Will remain free from infection Outcome: Progressing S/Sx of infection monitored and assessed q-shift.  Pt has remained afebrile thus far.  He is on IV abx per MD's orders.    Problem: Clinical Measurements: Goal: Respiratory complications will improve Outcome: Progressing Respiratory status monitored and assessed q-shift.  Pt is on room air with PO2 at 94-99 and respiration rate of 16-18 breaths per minute.  Pt has not endorsed c/o SOB or DOE.    Problem: Activity: Goal: Risk for activity intolerance will decrease Outcome: Progressing Pt is independent of all his ADLs.  He was observed OOB ambulating in his room with a steady gait.   Problem: Nutrition: Goal: Adequate nutrition will be maintained Outcome: Progressing Pt is on a carb modified diet per MD's orders.  He has been able to tolerate his diet w/o s/sx of n/v or abdominal pain/ distention.    Problem: Coping: Goal: Level of anxiety will decrease Outcome: Progressing Pt has not endorsed c/o anxiety thus far.       Problem: Clinical Measurements: Goal: Cardiovascular complication will be avoided Outcome: Progressing Pt VS hypertensive this shift.  Lafonda Mosses, MD notified.  50mg  of PO hydralazine was ordered and given per Lafonda Mosses, MD's orders.     Problem: Elimination: Goal: Will not experience complications related to bowel motility Outcome: Progressing Pt is continent of both bowel and bladder.  He has not endorsed c/o constipation, dysuria or abdominal pain/ distention.          Problem: Pain Managment: Goal: General experience of comfort  will improve Outcome: Progressing Pt has endorsed c/o 2-10/10 RLE pain describing it as a constant sharp, throbbing, ache.  Reiterated pain scale so he could adequately rate his pain. Pt stated his pain goal this admission would be 0/10. Discussed nonpharmacological methods to help reduce s/sx of pain. Interventions given per pt's request and MD's orders.   Problem: Safety: Goal: Ability to remain free from injury will improve Outcome: Progressing Pt has remained free from falls thus far.  Instructed pt to utilize RN call light for assistance.  Hourly rounds performed.  Bed locked, in lowest position, with two upper side rails engaged.  Belongings and call light within reach.  Pt was observed OOB ambulating in his room with a steady gait.  Nonskid sock implemented.   Problem: Skin Integrity: Goal: Risk for impaired skin integrity will decrease Outcome: Progressing Skin integrity monitored and assessed q-shift.  Instructed pt to turn q2 hours to prevent further skin impairment.  Tubes and drains assessed for device related pressure sores.  Pt is continent of both bowel and bladder.  Dressing changes performed per MD's orders.

## 2023-05-21 NOTE — Progress Notes (Signed)
Pt admitted from the ED for RLE cellulitis x2 months.  MD concern for osteomyelitis.  Pt was transported via gurney accompanied by transporter.  Assisted pt into his room and made him aware of his surroundings.  Pt was observed to have a steady gait when OOB  ambulating in his room.  Obtained VS and noted pt's BP was elevated 202/108 manually.  Notified Lafonda Mosses, MD of findings.  Reviewed POC with pt.  Answered any pending questions.  Pt had no further questions.  Instructed pt to utilize RN call light for assistance.  Bed locked in lowest position, with two upper side rails engaged.  Belongings and call light within reach.

## 2023-05-21 NOTE — ED Notes (Signed)
ED TO INPATIENT HANDOFF REPORT  ED Nurse Name and Phone #: Einar Grad [phone]  S Name/Age/Gender [name] 52 y.o. male Room/Bed: H021C/H021C  Code Status   Code Status: Full Code  Home/SNF/Other Home Patient oriented to: self, place, time, and situation Is this baseline? Yes   Triage Complete: Triage complete  Chief Complaint Cellulitis [L03.90]  Triage Note Pt came in via POV d/t cellulitis for the past 2 months worse on his Rt lower leg than on his Lt lower leg. Does have some drainage & odor, pain intermittently. A/Ox4, has been seen at the [facility] for this.   Allergies No Known Allergies  Level of Care/Admitting Diagnosis ED Disposition     ED Disposition  Admit   Condition  --   Comment  Hospital Area: [facility] [[id number]]  Level of Care: Med-Surg [16]  May place patient in observation at [facility] or [facility] if equivalent level of care is available:: No  Covid Evaluation: Asymptomatic - no recent exposure (last 10 days) testing not required  Diagnosis: Cellulitis [604540]  Admitting Physician: Mercie Eon [[id number]]  Attending Physician: Mercie Eon [[id number]]          B Medical/Surgery History Past Medical History:  Diagnosis Date   HTN (hypertension)    Stroke [facility])    Past Surgical History:  Procedure Laterality Date   HAND SURGERY Right    TONSILLECTOMY       A IV Location/Drains/Wounds Patient Lines/Drains/Airways Status     Active Line/Drains/Airways     Name Placement date Placement time Site Days   Peripheral IV 05/21/23 20 G Left;Posterior Hand 05/21/23  1304  Hand  less than 1            Intake/Output Last 24 hours  Intake/Output Summary (Last 24 hours) at 05/21/2023 1346 Last data filed at 05/21/2023 1339 Gross per 24 hour  Intake 100 ml  Output --  Net 100 ml    Labs/Imaging Results for orders placed or performed during the hospital encounter of 05/21/23 (from the past 48 hour(s))   Urinalysis, w/ Reflex to Culture (Infection Suspected) -Urine, Clean Catch     Status: Abnormal   Collection Time: 05/21/23 11:04 AM  Result Value Ref Range   Specimen Source URINE, CLEAN CATCH    Color, Urine YELLOW YELLOW   APPearance CLEAR CLEAR   Specific Gravity, Urine 1.014 1.005 - 1.030   pH 6.0 5.0 - 8.0   Glucose, UA NEGATIVE NEGATIVE mg/dL   Hgb urine dipstick NEGATIVE NEGATIVE   Bilirubin Urine NEGATIVE NEGATIVE   Ketones, ur NEGATIVE NEGATIVE mg/dL   Protein, ur NEGATIVE NEGATIVE mg/dL   Nitrite NEGATIVE NEGATIVE   Leukocytes,Ua NEGATIVE NEGATIVE   RBC / HPF 0-5 0 - 5 RBC/hpf   WBC, UA 0-5 0 - 5 WBC/hpf    Comment:        Reflex urine culture not performed if WBC <=10, OR if Squamous epithelial cells >5. If Squamous epithelial cells >5 suggest recollection.    Bacteria, UA RARE (A) NONE SEEN   Squamous Epithelial / HPF 0-5 0 - 5 /HPF   Mucus PRESENT     Comment: Performed at [facility], [address], [place], [place] [address]  Comprehensive metabolic panel     Status: Abnormal   Collection Time: 05/21/23 11:12 AM  Result Value Ref Range   Sodium 135 135 - 145 mmol/L   Potassium 3.4 (L) 3.5 - 5.1 mmol/L   Chloride 97 (  L) 98 - 111 mmol/L   CO2 29 22 - 32 mmol/L   Glucose, Bld 155 (H) 70 - 99 mg/dL    Comment: Glucose reference range applies only to samples taken after fasting for at least 8 hours.   BUN 6 6 - 20 mg/dL   Creatinine, Ser 9.56 0.61 - 1.24 mg/dL   Calcium 8.6 (L) 8.9 - 10.3 mg/dL   Total Protein 7.7 6.5 - 8.1 g/dL   Albumin 3.1 (L) 3.5 - 5.0 g/dL   AST 28 15 - 41 U/L   ALT 34 0 - 44 U/L   Alkaline Phosphatase 73 38 - 126 U/L   Total Bilirubin 0.5 0.3 - 1.2 mg/dL   GFR, Estimated >21 >30 mL/min    Comment: (NOTE) Calculated using the CKD-EPI Creatinine Equation (2021)    Anion gap 9 5 - 15    Comment: Performed at [facility], [address] [place], [place] [address]  CBC with Differential     Status: Abnormal    Collection Time: 05/21/23 11:12 AM  Result Value Ref Range   WBC 13.4 (H) 4.0 - 10.5 K/uL   RBC 4.81 4.22 - 5.81 MIL/uL   Hemoglobin 13.0 13.0 - 17.0 g/dL   HCT 46.9 62.9 - 52.8 %   MCV 85.2 80.0 - 100.0 fL   MCH 27.0 26.0 - 34.0 pg   MCHC 31.7 30.0 - 36.0 g/dL   RDW 41.3 24.4 - 01.0 %   Platelets 331 150 - 400 K/uL   nRBC 0.0 0.0 - 0.2 %   Neutrophils Relative % 69 %   Neutro Abs 9.2 (H) 1.7 - 7.7 K/uL   Lymphocytes Relative 20 %   Lymphs Abs 2.7 0.7 - 4.0 K/uL   Monocytes Relative 8 %   Monocytes Absolute 1.1 (H) 0.1 - 1.0 K/uL   Eosinophils Relative 1 %   Eosinophils Absolute 0.2 0.0 - 0.5 K/uL   Basophils Relative 1 %   Basophils Absolute 0.1 0.0 - 0.1 K/uL   Immature Granulocytes 1 %   Abs Immature Granulocytes 0.14 (H) 0.00 - 0.07 K/uL    Comment: Performed at [facility], [address] [place], [place] [address]  I-Stat Lactic Acid, ED     Status: None   Collection Time: 05/21/23 11:19 AM  Result Value Ref Range   Lactic Acid, Venous 1.3 0.5 - 1.9 mmol/L   No results found.  Pending Labs Unresulted Labs (From admission, onward)     Start     Ordered   05/22/23 0500  HIV Antibody (routine testing w rflx)  (HIV Antibody (Routine testing w reflex) panel)  Tomorrow morning,   R        05/21/23 1335   05/22/23 0500  Comprehensive metabolic panel  Tomorrow morning,   R        05/21/23 1335   05/22/23 0500  Protime-INR  Tomorrow morning,   R        05/21/23 1335   05/22/23 0500  CBC  Tomorrow morning,   R        05/21/23 1335   05/21/23 1211  Brain natriuretic peptide  Once,   URGENT        05/21/23 1210   05/21/23 1210  Culture, blood (routine x 2)  BLOOD CULTURE X 2,   R (with STAT occurrences)      05/21/23 1209            Vitals/Pain Today's Vitals  05/21/23 1041 05/21/23 1101  BP: (!) 173/90   Pulse: 92   Resp: 17   Temp: 98.1 F (36.7 C)   SpO2: 94%   Weight:  128.4 kg  Height:  5\' 11"  (1.803 m)  PainSc:  0-No pain    Isolation  Precautions No active isolations  Medications Medications  rivaroxaban (XARELTO) tablet 10 mg (has no administration in time range)  cefTRIAXone (ROCEPHIN) 2 g in sodium chloride 0.9 % 100 mL IVPB (has no administration in time range)  nicotine (NICODERM CQ - dosed in mg/24 hours) patch 21 mg (has no administration in time range)    Mobility walks     Focused Assessments     R Recommendations: See Admitting Provider Note  Report given to:   Additional Notes:
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
## 2023-05-21 NOTE — ED Triage Notes (Signed)
Pt came in via POV d/t cellulitis for the past 2 months worse on his Rt lower leg than on his Lt lower leg. Does have some drainage & odor, pain intermittently. A/Ox4, has been seen at the [facility] for this.
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
## 2023-05-21 NOTE — ED Notes (Signed)
Update  brother [phone]

## 2023-05-21 NOTE — Consult Note (Signed)
WOC Nurse Consult Note: Reason for Consult: Consult requested for cellulitis to bilat legs. Performed remotely after review of progress notes and photos in the EMR.  Pt has been followed as an outpatient by the [facility] and was seen this morning and was referred to the ED for increased osdor and drainage.  X-rays are pending to r/o abscess.  Right leg with posterior full thickness wound with significant slough and noted to have large amt drainage and strong odor.  Bilat legs with scattered areas of partial thickness wounds and dry scabbed wounds.  Dressing procedure/placement/frequency: Topical treatment orders provided for bedside nurses to perform as follows to absorb drainage and promote healing: Change dressings Q day as follows: Apply Xeroform gauze to open areas on bilat legs, then cover with ABD pads and kerlex.   Please re-consult if further assistance is needed.  Thank-you,  Cammie Mcgee MSN, RN, CWOCN, [place], CNS [phone]

## 2023-05-22 ENCOUNTER — Other Ambulatory Visit (HOSPITAL_COMMUNITY): Payer: Self-pay

## 2023-05-22 DIAGNOSIS — I10 Essential (primary) hypertension: Secondary | ICD-10-CM

## 2023-05-22 DIAGNOSIS — R600 Localized edema: Secondary | ICD-10-CM | POA: Diagnosis not present

## 2023-05-22 DIAGNOSIS — I872 Venous insufficiency (chronic) (peripheral): Secondary | ICD-10-CM | POA: Diagnosis not present

## 2023-05-22 DIAGNOSIS — L03115 Cellulitis of right lower limb: Secondary | ICD-10-CM | POA: Diagnosis not present

## 2023-05-22 LAB — CBC
HCT: 41.6 % (ref 39.0–52.0)
Hemoglobin: 13.3 g/dL (ref 13.0–17.0)
MCH: 27.6 pg (ref 26.0–34.0)
MCHC: 32 g/dL (ref 30.0–36.0)
MCV: 86.3 fL (ref 80.0–100.0)
Platelets: 302 10*3/uL (ref 150–400)
RBC: 4.82 MIL/uL (ref 4.22–5.81)
RDW: 14.9 % (ref 11.5–15.5)
WBC: 10 10*3/uL (ref 4.0–10.5)
nRBC: 0 % (ref 0.0–0.2)

## 2023-05-22 LAB — COMPREHENSIVE METABOLIC PANEL
ALT: 35 U/L (ref 0–44)
AST: 27 U/L (ref 15–41)
Albumin: 3 g/dL — ABNORMAL LOW (ref 3.5–5.0)
Alkaline Phosphatase: 72 U/L (ref 38–126)
Anion gap: 8 (ref 5–15)
BUN: 8 mg/dL (ref 6–20)
CO2: 29 mmol/L (ref 22–32)
Calcium: 8.8 mg/dL — ABNORMAL LOW (ref 8.9–10.3)
Chloride: 98 mmol/L (ref 98–111)
Creatinine, Ser: 0.92 mg/dL (ref 0.61–1.24)
GFR, Estimated: >60 mL/min (ref >60)
Glucose, Bld: 106 mg/dL — ABNORMAL HIGH (ref 70–99)
Potassium: 3.8 mmol/L (ref 3.5–5.1)
Sodium: 135 mmol/L (ref 135–145)
Total Bilirubin: 0.9 mg/dL (ref 0.3–1.2)
Total Protein: 7.6 g/dL (ref 6.5–8.1)

## 2023-05-22 LAB — HIV ANTIBODY (ROUTINE TESTING W REFLEX): HIV Screen 4th Generation wRfx: NONREACTIVE

## 2023-05-22 LAB — MAGNESIUM: Magnesium: 2.1 mg/dL (ref 1.7–2.4)

## 2023-05-22 MED ORDER — AMOXICILLIN-POT CLAVULANATE 875-125 MG PO TABS
1.0000 | ORAL_TABLET | Freq: Two times a day (BID) | ORAL | 0 refills | Status: DC
  Filled 2023-05-22: qty 10, 5d supply, fill #0

## 2023-05-22 MED ORDER — LISINOPRIL 20 MG PO TABS
10.0000 mg | ORAL_TABLET | Freq: Every day | ORAL | Status: DC
  Administered 2023-05-22: 10 mg via ORAL
  Filled 2023-05-22: qty 1

## 2023-05-22 MED ORDER — ACETAMINOPHEN 500 MG PO TABS
1000.0000 mg | ORAL_TABLET | Freq: Four times a day (QID) | ORAL | 0 refills | Status: AC
  Filled 2023-05-22: qty 100, 13d supply, fill #0

## 2023-05-22 MED ORDER — LISINOPRIL 10 MG PO TABS
10.0000 mg | ORAL_TABLET | Freq: Every day | ORAL | 0 refills | Status: DC
  Filled 2023-05-22: qty 30, 30d supply, fill #0

## 2023-05-22 NOTE — Evaluation (Signed)
Physical Therapy Evaluation Patient Details Name: Alex [name] MRN: [id number] DOB: August 05, 1971 Today's Date: 05/22/2023  History of Present Illness  52 y.o. male presents to [facility] hospital on 05/21/2023 with RLE pain and swelling. PMH includes CVA, HTN, MI, COPD.  Clinical Impression  Pt presents to PT with no significant deficits in mobility. Pt is able to ambulate independently at this time as well as negotiate stairs. Pt reports recent limitations in activity tolerance due to LE pain, but he also reports this is much improved since arrival to the hospital. PT encourages frequent mobilization as tolerated, as well as LE elevation when resting in an effort to reduce LE swelling. Pt has no further acute PT needs for mobility purposes. PT signing off.        If plan is discharge home, recommend the following:     Can travel by private vehicle        Equipment Recommendations None recommended by PT  Recommendations for Other Services       Functional Status Assessment       Precautions / Restrictions Precautions Precautions: None Restrictions Weight Bearing Restrictions: No      Mobility  Bed Mobility                    Transfers Overall transfer level: Independent Equipment used: None                    Ambulation/Gait Ambulation/Gait assistance: Independent Gait Distance (Feet): 150 Feet Assistive device: None Gait Pattern/deviations: Step-through pattern       General Gait Details: steady step-through gait  Stairs Stairs: Yes Stairs assistance: Modified independent (Device/Increase time) Stair Management: One rail Right, Alternating pattern Number of Stairs: 4    Wheelchair Mobility     Tilt Bed    Modified Rankin (Stroke Patients Only)       Balance                                             Pertinent Vitals/Pain Pain Assessment Pain Assessment: Faces Faces Pain Scale: Hurts little more Pain Location:  BLE Pain Descriptors / Indicators: Sore Pain Intervention(s): Monitored during session    Home Living   Living Arrangements: Non-relatives/Friends               Home Equipment: None      Prior Function                       Extremity/Trunk Assessment                Communication   Communication Communication: No apparent difficulties Cueing Techniques: Verbal cues  Cognition                                                General Comments General comments (skin integrity, edema, etc.): VSS on RA, PT notes mild amount of dried yellow drainage on dressing to posterior RLE    Exercises     Assessment/Plan    PT Assessment    PT Problem List Decreased activity tolerance       PT Treatment Interventions      PT Goals (Current goals can be found in the Care  Plan section)       Frequency       Co-evaluation               AM-PAC PT "6 Clicks" Mobility  Outcome Measure Help needed turning from your back to your side while in a flat bed without using bedrails?: None Help needed moving from lying on your back to sitting on the side of a flat bed without using bedrails?: None Help needed moving to and from a bed to a chair (including a wheelchair)?: None Help needed standing up from a chair using your arms (e.g., wheelchair or bedside chair)?: None Help needed to walk in hospital room?: None Help needed climbing 3-5 steps with a railing? : None 6 Click Score: 24    End of Session   Activity Tolerance: Patient tolerated treatment well Patient left: in bed;with call bell/phone within reach Nurse Communication: Mobility status PT Visit Diagnosis: Other (comment);Pain (skin integrity) Pain - part of body: Leg    Time: 0852-0902 PT Time Calculation (min) (ACUTE ONLY): 10 min   Charges:   PT Evaluation $PT Eval Low Complexity: 1 Low   PT General Charges $$ ACUTE PT VISIT: 1 Visit         Arlyss Gandy, PT,  DPT Acute Rehabilitation Office [phone]   Arlyss Gandy 05/22/2023, 10:13 AM

## 2023-05-22 NOTE — Progress Notes (Signed)
Discharge instructions (including medications) discussed with and copy provided to patient/caregiver

## 2023-05-22 NOTE — Plan of Care (Signed)
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
## 2023-05-22 NOTE — TOC Initial Note (Addendum)
Transition of Care [facility]) - Initial/Assessment Note    Patient Details  Name: Alex [name] MRN: [id number] Date of Birth: 10/06/1970  Transition of Care [facility]) CM/SW Contact:    Lawerance Sabal, RN Phone Number: 05/22/2023, 3:14 PM  Clinical Narrative:                  Sherron Monday w patient at bedside.  He states that he is active with the [facility], he follows there for wound care, he feels they "treat him right," he is satisfied with the care he gets there. He states that that he has been there twice for wound management on his legs. We discussed care of legs and bandages going forward after DC. He states that he feels he can care for them on his own. He demonstrated to me that he can reach both legs and declines HH at this time.  He states that roommate can transport him.   Expected Discharge Plan: Home/Self Care Barriers to Discharge: No Barriers Identified   Patient Goals and CMS Choice Patient states their goals for this hospitalization and ongoing recovery are:: to go home          Expected Discharge Plan and Services   Discharge Planning Services: CM Consult Post Acute Care Choice: NA Living arrangements for the past 2 months: Single Family Home Expected Discharge Date: 05/22/23                                    Prior Living Arrangements/Services Living arrangements for the past 2 months: Single Family Home Lives with:: Friends                   Activities of Daily Living Home Assistive Devices/Equipment: None ADL Screening (condition at time of admission) Patient's cognitive ability adequate to safely complete daily activities?: Yes Is the patient deaf or have difficulty hearing?: No Does the patient have difficulty seeing, even when wearing glasses/contacts?: No Does the patient have difficulty concentrating, remembering, or making decisions?: No Patient able to express need for assistance with ADLs?: No Does the patient have difficulty dressing  or bathing?: Yes Independently performs ADLs?: Yes (appropriate for developmental age) Does the patient have difficulty walking or climbing stairs?: Yes Weakness of Legs: Right Weakness of Arms/Hands: None  Permission Sought/Granted                  Emotional Assessment              Admission diagnosis:  Cellulitis [L03.90] Rash [R21] Lower extremity edema [R60.0] Patient Active Problem List   Diagnosis Date Noted   Lower extremity edema 05/22/2023   Essential hypertension 05/22/2023   Cellulitis 05/21/2023   Secondary hypertension 05/21/2023   Morbid obesity (HCC) 05/21/2023   History of cerebrovascular accident (CVA) with residual deficit 12/12/2021   Tobacco dependence 12/12/2021   Alcohol use disorder 12/12/2021   Prediabetes 12/12/2021   Situational depression 12/12/2021   CVA (cerebral vascular accident) (HCC) 11/20/2021   Hypertensive urgency 01/01/2021   Tobacco use 01/01/2021   Alcohol use 01/01/2021   Substance use 01/01/2021   Vertigo 01/01/2021   Resistant hypertension 01/01/2021   PCP:  Clinic, [facility]:   [facility] [phone] G[place] Otto, [place] - [address] AT [facility] [address] [place] [address] Phone: [phone] Fax: [phone]  [place] [facility] [address] [place] [address] Phone: [phone] Fax: [phone]  [facility] [address] [place] [address] Phone: [phone] Fax: [phone]  [facility] [address], [address] [place] [address] Phone: [phone] Fax: [phone]  [facility] - [place], [place] - [address] [facility] [address] [place] [address] Phone: [phone] Fax: [phone]     Social Determinants of Health (SDOH) Social History: SDOH Screenings   Food  Insecurity: Food Insecurity Present (05/21/2023)  Housing: Low Risk  (05/21/2023)  Transportation Needs: Unmet Transportation Needs (05/21/2023)  Utilities: Not At Risk (05/21/2023)  Depression (PHQ2-9): Medium Risk (02/10/2022)  Tobacco Use: High Risk (05/21/2023)   SDOH Interventions:     Readmission Risk Interventions    11/22/2021   11:28 AM  Readmission Risk Prevention Plan  Post Dischage Appt Not Complete  Appt Comments [facility], he is aware of this and verbalizes agreement to do so  Medication Screening Complete  Transportation Screening Complete

## 2023-05-22 NOTE — TOC Benefit Eligibility Note (Addendum)
Patient Product/process development scientist completed.    The patient is insured through [facility]. Patient has [employer], may use a copay card, and/or apply for patient assistance if available.    Ran test claim for lisinopril 10 mg and the current 30 day co-pay is $1.29.  Ran test claim for amoxicillin-clavulanate (Augmentin) 875-125 mg and the current 10 day co-pay is $12.88.  This test claim was processed through [facility]- copay amounts may vary at other pharmacies due to pharmacy/plan contracts, or as the patient moves through the different stages of their insurance plan.     Roland Earl, CPHT Pharmacy Technician III Certified Patient Advocate [facility] Patient Advocate Team Direct Number: [phone]  Fax: [phone]

## 2023-05-22 NOTE — Discharge Instructions (Addendum)
Alex [name],  You were recently admitted to [facility] for cellulitis on your right calf.   Continue taking your home medications with the following changes  Start taking Augmentin 875-125 mg twice daily for 5 days (9/17-9/21) Lisinopril 10 mg daily Continue taking Amlodipine 10 mg daily Aspirin 81 mg daily Hydrochlorothiazide 25 mg daily Hydralazine 50 mg 3 times daily Rosuvastatin 20 mg daily Tiotropium 2.5 mg daily Albuterol inhaler as needed Change dressings on legs every other day   You should seek further medical care if you have pain with movement of your ankles and toes, worsening redness and swelling, increased pus draining from the wounds, chest pain, lightheadedness and weakness, changes in vision, sudden severe headache different from past headaches, sudden difficulty speaking.  We recommend that you see your primary care doctor in about a week to make sure that you continue to improve. We are so glad that you are feeling better.  Sincerely, Kristie Cowman

## 2023-05-26 LAB — CULTURE, BLOOD (ROUTINE X 2)
Culture: NO GROWTH
Culture: NO GROWTH

## 2024-02-18 ENCOUNTER — Inpatient Hospital Stay (HOSPITAL_COMMUNITY)
Admission: EM | Admit: 2024-02-18 | Discharge: 2024-02-25 | DRG: 291 | Disposition: A | Discharge status: Home Health | Attending: Internal Medicine | Admitting: Internal Medicine

## 2024-02-18 ENCOUNTER — Emergency Department (HOSPITAL_COMMUNITY)

## 2024-02-18 ENCOUNTER — Other Ambulatory Visit: Payer: Self-pay

## 2024-02-18 ENCOUNTER — Encounter (HOSPITAL_COMMUNITY): Payer: Self-pay

## 2024-02-18 DIAGNOSIS — Z7982 Long term (current) use of aspirin: Secondary | ICD-10-CM

## 2024-02-18 DIAGNOSIS — R0902 Hypoxemia: Secondary | ICD-10-CM | POA: Diagnosis not present

## 2024-02-18 DIAGNOSIS — I471 Supraventricular tachycardia, unspecified: Secondary | ICD-10-CM | POA: Diagnosis present

## 2024-02-18 DIAGNOSIS — E66812 Obesity, class 2: Secondary | ICD-10-CM | POA: Diagnosis present

## 2024-02-18 DIAGNOSIS — Z79899 Other long term (current) drug therapy: Secondary | ICD-10-CM

## 2024-02-18 DIAGNOSIS — J9811 Atelectasis: Secondary | ICD-10-CM | POA: Diagnosis present

## 2024-02-18 DIAGNOSIS — L03115 Cellulitis of right lower limb: Secondary | ICD-10-CM | POA: Diagnosis present

## 2024-02-18 DIAGNOSIS — I110 Hypertensive heart disease with heart failure: Principal | ICD-10-CM | POA: Diagnosis present

## 2024-02-18 DIAGNOSIS — I77819 Aortic ectasia, unspecified site: Secondary | ICD-10-CM | POA: Diagnosis present

## 2024-02-18 DIAGNOSIS — Z6839 Body mass index (BMI) 39.0-39.9, adult: Secondary | ICD-10-CM

## 2024-02-18 DIAGNOSIS — Z833 Family history of diabetes mellitus: Secondary | ICD-10-CM

## 2024-02-18 DIAGNOSIS — I2510 Atherosclerotic heart disease of native coronary artery without angina pectoris: Secondary | ICD-10-CM | POA: Diagnosis present

## 2024-02-18 DIAGNOSIS — Z91148 Patient's other noncompliance with medication regimen for other reason: Secondary | ICD-10-CM

## 2024-02-18 DIAGNOSIS — R001 Bradycardia, unspecified: Secondary | ICD-10-CM | POA: Diagnosis not present

## 2024-02-18 DIAGNOSIS — J9601 Acute respiratory failure with hypoxia: Secondary | ICD-10-CM | POA: Diagnosis present

## 2024-02-18 DIAGNOSIS — Z8249 Family history of ischemic heart disease and other diseases of the circulatory system: Secondary | ICD-10-CM

## 2024-02-18 DIAGNOSIS — I700 Atherosclerosis of aorta: Secondary | ICD-10-CM | POA: Diagnosis present

## 2024-02-18 DIAGNOSIS — T461X6A Underdosing of calcium-channel blockers, initial encounter: Secondary | ICD-10-CM | POA: Diagnosis present

## 2024-02-18 DIAGNOSIS — F32A Depression, unspecified: Secondary | ICD-10-CM | POA: Diagnosis present

## 2024-02-18 DIAGNOSIS — J449 Chronic obstructive pulmonary disease, unspecified: Secondary | ICD-10-CM | POA: Diagnosis present

## 2024-02-18 DIAGNOSIS — R600 Localized edema: Secondary | ICD-10-CM | POA: Diagnosis present

## 2024-02-18 DIAGNOSIS — E440 Moderate protein-calorie malnutrition: Secondary | ICD-10-CM | POA: Diagnosis present

## 2024-02-18 DIAGNOSIS — F17210 Nicotine dependence, cigarettes, uncomplicated: Secondary | ICD-10-CM | POA: Diagnosis present

## 2024-02-18 DIAGNOSIS — T465X6A Underdosing of other antihypertensive drugs, initial encounter: Secondary | ICD-10-CM | POA: Diagnosis present

## 2024-02-18 DIAGNOSIS — L0390 Cellulitis, unspecified: Secondary | ICD-10-CM | POA: Diagnosis present

## 2024-02-18 DIAGNOSIS — I472 Ventricular tachycardia, unspecified: Secondary | ICD-10-CM | POA: Diagnosis not present

## 2024-02-18 DIAGNOSIS — I4719 Other supraventricular tachycardia: Secondary | ICD-10-CM | POA: Diagnosis present

## 2024-02-18 DIAGNOSIS — I5033 Acute on chronic diastolic (congestive) heart failure: Secondary | ICD-10-CM | POA: Diagnosis present

## 2024-02-18 DIAGNOSIS — D649 Anemia, unspecified: Secondary | ICD-10-CM | POA: Diagnosis present

## 2024-02-18 DIAGNOSIS — G4733 Obstructive sleep apnea (adult) (pediatric): Secondary | ICD-10-CM | POA: Diagnosis present

## 2024-02-18 DIAGNOSIS — E785 Hyperlipidemia, unspecified: Secondary | ICD-10-CM | POA: Diagnosis present

## 2024-02-18 DIAGNOSIS — E8809 Other disorders of plasma-protein metabolism, not elsewhere classified: Secondary | ICD-10-CM | POA: Diagnosis present

## 2024-02-18 DIAGNOSIS — Z5941 Food insecurity: Secondary | ICD-10-CM

## 2024-02-18 DIAGNOSIS — D509 Iron deficiency anemia, unspecified: Secondary | ICD-10-CM | POA: Diagnosis present

## 2024-02-18 DIAGNOSIS — Z604 Social exclusion and rejection: Secondary | ICD-10-CM | POA: Diagnosis present

## 2024-02-18 DIAGNOSIS — D72829 Elevated white blood cell count, unspecified: Secondary | ICD-10-CM | POA: Diagnosis present

## 2024-02-18 DIAGNOSIS — L03116 Cellulitis of left lower limb: Secondary | ICD-10-CM | POA: Diagnosis present

## 2024-02-18 DIAGNOSIS — I160 Hypertensive urgency: Secondary | ICD-10-CM | POA: Diagnosis present

## 2024-02-18 DIAGNOSIS — Z8673 Personal history of transient ischemic attack (TIA), and cerebral infarction without residual deficits: Secondary | ICD-10-CM

## 2024-02-18 DIAGNOSIS — I10 Essential (primary) hypertension: Principal | ICD-10-CM

## 2024-02-18 DIAGNOSIS — I161 Hypertensive emergency: Principal | ICD-10-CM | POA: Diagnosis present

## 2024-02-18 DIAGNOSIS — Z720 Tobacco use: Secondary | ICD-10-CM | POA: Diagnosis present

## 2024-02-18 DIAGNOSIS — R269 Unspecified abnormalities of gait and mobility: Secondary | ICD-10-CM | POA: Diagnosis present

## 2024-02-18 DIAGNOSIS — Z599 Problem related to housing and economic circumstances, unspecified: Secondary | ICD-10-CM

## 2024-02-18 DIAGNOSIS — R531 Weakness: Secondary | ICD-10-CM

## 2024-02-18 LAB — CBC WITH DIFFERENTIAL/PLATELET
Abs Immature Granulocytes: 0.07 10*3/uL (ref 0.00–0.07)
Basophils Absolute: 0 10*3/uL (ref 0.0–0.1)
Basophils Relative: 0 %
Eosinophils Absolute: 0.1 10*3/uL (ref 0.0–0.5)
Eosinophils Relative: 1 %
HCT: 42.8 % (ref 39.0–52.0)
Hemoglobin: 13.4 g/dL (ref 13.0–17.0)
Immature Granulocytes: 1 %
Lymphocytes Relative: 15 %
Lymphs Abs: 1.6 10*3/uL (ref 0.7–4.0)
MCH: 26.1 pg (ref 26.0–34.0)
MCHC: 31.3 g/dL (ref 30.0–36.0)
MCV: 83.4 fL (ref 80.0–100.0)
Monocytes Absolute: 1.1 10*3/uL — ABNORMAL HIGH (ref 0.1–1.0)
Monocytes Relative: 10 %
Neutro Abs: 7.9 10*3/uL — ABNORMAL HIGH (ref 1.7–7.7)
Neutrophils Relative %: 73 %
Platelets: 391 10*3/uL (ref 150–400)
RBC: 5.13 MIL/uL (ref 4.22–5.81)
RDW: 15.7 % — ABNORMAL HIGH (ref 11.5–15.5)
WBC: 10.8 10*3/uL — ABNORMAL HIGH (ref 4.0–10.5)
nRBC: 0 % (ref 0.0–0.2)

## 2024-02-18 LAB — BASIC METABOLIC PANEL WITH GFR
Anion gap: 9 (ref 5–15)
BUN: 11 mg/dL (ref 6–20)
CO2: 27 mmol/L (ref 22–32)
Calcium: 9.5 mg/dL (ref 8.9–10.3)
Chloride: 100 mmol/L (ref 98–111)
Creatinine, Ser: 0.95 mg/dL (ref 0.61–1.24)
GFR, Estimated: >60 mL/min (ref >60)
Glucose, Bld: 107 mg/dL — ABNORMAL HIGH (ref 70–99)
Potassium: 4 mmol/L (ref 3.5–5.1)
Sodium: 136 mmol/L (ref 135–145)

## 2024-02-18 LAB — D-DIMER, QUANTITATIVE: D-Dimer, Quant: 3.25 ug{FEU}/mL — ABNORMAL HIGH (ref 0.00–0.50)

## 2024-02-18 LAB — TROPONIN I (HIGH SENSITIVITY)
Troponin I (High Sensitivity): 17 ng/L (ref <18)
Troponin I (High Sensitivity): 20 ng/L — ABNORMAL HIGH (ref <18)

## 2024-02-18 LAB — ETHANOL: Alcohol, Ethyl (B): <15 mg/dL (ref <15)

## 2024-02-18 LAB — BRAIN NATRIURETIC PEPTIDE: B Natriuretic Peptide: 67 pg/mL (ref 0.0–100.0)

## 2024-02-18 MED ORDER — IPRATROPIUM-ALBUTEROL 0.5-2.5 (3) MG/3ML IN SOLN
6.0000 mL | Freq: Once | RESPIRATORY_TRACT | Status: AC
  Administered 2024-02-18: 6 mL via RESPIRATORY_TRACT
  Filled 2024-02-18: qty 3

## 2024-02-18 NOTE — ED Triage Notes (Signed)
 Pt reports BIL pitting edema with weeping wounds and HTN. VSS.

## 2024-02-18 NOTE — ED Notes (Signed)
 CCMD contacted for monitoring

## 2024-02-18 NOTE — ED Provider Notes (Signed)
 [place] EMERGENCY DEPARTMENT AT [facility] Provider Note   CSN: [id number] Arrival date & time: 02/18/24  1925     Patient presents with: Hypertension   Alex [name] is a 53 y.o. male.  53 y.o male with a past medical history of hypertension, COPD presents to the ED with a chief complaint of elevated blood pressure.  Patient reports he stopped taking his medication several weeks ago, has been running blood pressures in the 170s on arrival.  He is also telling me that his roommate said he needed to be evaluated due to suicidal ideations , however he is denying suicidal ideations at this time.  He does report feeling somewhat depressed lately.  He continues to have bilateral lower extremity weeping that has been a problem for him in the past.  He reports he gets very short of breath whenever he tries to ambulate.  He has not taken any antibiotics to help with his legs.  He continues to endorse tobacco use, does drink alcohol reports he is cut down some . He reports no chest pain, no shortness of breath, no fevers.     Prior to Admission medications   Medication Sig Start Date End Date Taking? Authorizing Provider  acetaminophen  (TYLENOL ) 500 MG tablet Take 2 tablets (1,000 mg total) by mouth every 6 (six) hours for 4 days then as directed by MD 05/22/23  Yes Jolaine Pac, DO  albuterol  (VENTOLIN  HFA) 108 (90 Base) MCG/ACT inhaler Inhale 2 puffs into the lungs every 6 (six) hours as needed for wheezing or shortness of breath.    [provider]  amLODipine  (NORVASC ) 10 MG tablet Take 10 mg by mouth daily. Patient not taking: Reported on 02/19/2024    [provider]  aspirin  81 MG EC tablet Take 1 tablet (81 mg total) by mouth daily. Swallow whole. Patient not taking: Reported on 02/19/2024 11/22/21   Jonel Lonni SQUIBB, MD  hydrALAZINE  (APRESOLINE ) 50 MG tablet Take 1 tablet (50 mg total) by mouth 3 (three) times daily. Patient not taking: Reported on  02/19/2024 01/11/22   Vicci Barnie NOVAK, MD  hydrochlorothiazide  (HYDRODIURIL ) 25 MG tablet Take 1 tablet (25 mg total) by mouth daily. Patient not taking: Reported on 02/19/2024 11/22/21 07/14/23  Jonel Lonni SQUIBB, MD  lisinopril  (ZESTRIL ) 10 MG tablet Take 1 tablet (10 mg total) by mouth daily. Patient not taking: Reported on 02/19/2024 05/23/23   Jolaine Pac, DO  rosuvastatin  (CRESTOR ) 20 MG tablet Take 10 mg by mouth daily. Patient not taking: Reported on 02/19/2024    [provider]  Tiotropium Bromide  Monohydrate 2.5 MCG/ACT AERS Inhale 1 puff into the lungs daily. Patient not taking: Reported on 02/19/2024    [provider]    Allergies: Patient has no known allergies.    Review of Systems  Constitutional:  Negative for chills and fever.  Respiratory:  Negative for shortness of breath.   Cardiovascular:  Positive for leg swelling. Negative for chest pain.  Gastrointestinal:  Negative for abdominal pain, nausea and vomiting.  Musculoskeletal:  Negative for back pain.  Psychiatric/Behavioral:  Positive for suicidal ideas.   All other systems reviewed and are negative.   Updated Vital Signs BP (!) 148/82 (BP Location: Left Arm)   Pulse 79   Temp 98.3 F (36.8 C) (Oral)   Resp 18   Ht 5' 11 (1.803 m)   Wt 126.9 kg   SpO2 95%   BMI 39.01 kg/m   Physical Exam Vitals and nursing  note reviewed.  Constitutional:      General: He is not in acute distress.    Appearance: Normal appearance. He is not ill-appearing.  HENT:     Head: Normocephalic and atraumatic.     Mouth/Throat:     Mouth: Mucous membranes are moist.   Cardiovascular:     Rate and Rhythm: Normal rate.     Pulses:          Dorsalis pedis pulses are 2+ on the right side and 2+ on the left side.  Pulmonary:     Effort: Pulmonary effort is normal.     Breath sounds: Wheezing present.  Abdominal:     General: Abdomen is flat. There is distension.     Palpations: Abdomen is soft.    Musculoskeletal:     Cervical back: Normal range of motion and neck supple.     Right lower leg: 1+ Pitting Edema present.     Left lower leg: 1+ Pitting Edema present.   Skin:    General: Skin is warm and dry.     Comments: Skin changes consistent with his impetigo, small wounds sources noted,mild erythema and pitting edema.    Neurological:     Mental Status: He is alert and oriented to person, place, and time.     (all labs ordered are listed, but only abnormal results are displayed) Labs Reviewed  CULTURE, BLOOD (ROUTINE X 2) - Abnormal; Notable for the following components:      Result Value   Culture   (*)    Value: STAPHYLOCOCCUS COHNII THE SIGNIFICANCE OF ISOLATING THIS ORGANISM FROM A SINGLE SET OF BLOOD CULTURES WHEN MULTIPLE SETS ARE DRAWN IS UNCERTAIN. PLEASE NOTIFY THE MICROBIOLOGY DEPARTMENT WITHIN ONE WEEK IF SPECIATION AND SENSITIVITIES ARE REQUIRED. Performed at [facility], [address], [place], [place] [address]    All other components within normal limits  BLOOD CULTURE ID PANEL (REFLEXED) - BCID2 - Abnormal; Notable for the following components:   Staphylococcus species DETECTED (*)    All other components within normal limits  BASIC METABOLIC PANEL WITH GFR - Abnormal; Notable for the following components:   Glucose, Bld 107 (*)    All other components within normal limits  CBC WITH DIFFERENTIAL/PLATELET - Abnormal; Notable for the following components:   WBC 10.8 (*)    RDW 15.7 (*)    Neutro Abs 7.9 (*)    Monocytes Absolute 1.1 (*)    All other components within normal limits  D-DIMER, QUANTITATIVE - Abnormal; Notable for the following components:   D-Dimer, Quant 3.25 (*)    All other components within normal limits  COMPREHENSIVE METABOLIC PANEL WITH GFR - Abnormal; Notable for the following components:   Glucose, Bld 153 (*)    Calcium  8.8 (*)    Albumin 2.6 (*)    All other components within normal limits  URINALYSIS, COMPLETE  (UACMP) WITH MICROSCOPIC - Abnormal; Notable for the following components:   Specific Gravity, Urine 1.032 (*)    All other components within normal limits  CBC WITH DIFFERENTIAL/PLATELET - Abnormal; Notable for the following components:   Hemoglobin 12.8 (*)    RDW 15.9 (*)    All other components within normal limits  IRON AND TIBC - Abnormal; Notable for the following components:   Iron 23 (*)    Saturation Ratios 8 (*)    All other components within normal limits  C-REACTIVE PROTEIN - Abnormal; Notable for the following components:   CRP  3.7 (*)    All other components within normal limits  BASIC METABOLIC PANEL WITH GFR - Abnormal; Notable for the following components:   Glucose, Bld 103 (*)    Calcium  8.6 (*)    All other components within normal limits  LIPID PANEL - Abnormal; Notable for the following components:   HDL 30 (*)    All other components within normal limits  COMPREHENSIVE METABOLIC PANEL WITH GFR - Abnormal; Notable for the following components:   Albumin 2.7 (*)    All other components within normal limits  CBC WITH DIFFERENTIAL/PLATELET - Abnormal; Notable for the following components:   RDW 15.9 (*)    All other components within normal limits  TROPONIN I (HIGH SENSITIVITY) - Abnormal; Notable for the following components:   Troponin I (High Sensitivity) 20 (*)    All other components within normal limits  RESP PANEL BY RT-PCR (RSV, FLU A&B, COVID)  RVPGX2  CULTURE, BLOOD (ROUTINE X 2)  BRAIN NATRIURETIC PEPTIDE  ETHANOL  MAGNESIUM  MAGNESIUM  PROCALCITONIN  TSH  FERRITIN  PROCALCITONIN  BRAIN NATRIURETIC PEPTIDE  RAPID URINE DRUG SCREEN, HOSP PERFORMED  TROPONIN I (HIGH SENSITIVITY)    EKG: EKG Interpretation Date/Time:  Tuesday February 19 2024 02:30:02 EDT Ventricular Rate:  75 PR Interval:  126 QRS Duration:  108 QT Interval:  433 QTC Calculation: 484 R Axis:   40  Text Interpretation: Sinus rhythm Abnormal T, consider ischemia, lateral  leads Confirmed by Lorette Mayo [phone]) on 02/19/2024 2:33:52 AM  Radiology: No results found.   Medications Ordered in the ED  acetaminophen  (TYLENOL ) tablet 650 mg (has no administration in time range)    Or  acetaminophen  (TYLENOL ) suppository 650 mg (has no administration in time range)  melatonin tablet 3 mg (has no administration in time range)  ondansetron  (ZOFRAN ) injection 4 mg (has no administration in time range)  albuterol  (PROVENTIL ) (2.5 MG/3ML) 0.083% nebulizer solution 2.5 mg (has no administration in time range)  amLODipine  (NORVASC ) tablet 10 mg (10 mg Oral Given 02/21/24 0841)  hydrALAZINE  (APRESOLINE ) injection 10 mg (10 mg Intravenous Given 02/19/24 0344)  nicotine  (NICODERM CQ  - dosed in mg/24 hours) patch 21 mg (21 mg Transdermal Patch Removed 02/21/24 0842)  ceFAZolin  (ANCEF ) IVPB 2g/100 mL premix (2 g Intravenous New Bag/Given 02/21/24 2216)  ferrous sulfate  tablet 325 mg (325 mg Oral Given 02/21/24 1655)  docusate sodium  (COLACE) capsule 200 mg (200 mg Oral Given 02/21/24 0841)  enoxaparin  (LOVENOX ) injection 40 mg (40 mg Subcutaneous Given 02/21/24 2210)  aspirin  EC tablet 81 mg (81 mg Oral Given 02/21/24 0841)  hydrALAZINE  (APRESOLINE ) tablet 100 mg (100 mg Oral Given 02/21/24 2209)  furosemide  (LASIX ) injection 80 mg (80 mg Intravenous Given 02/21/24 1654)  sacubitril -valsartan  (ENTRESTO ) 97-103 mg per tablet (1 tablet Oral Given 02/21/24 2209)  spironolactone  (ALDACTONE ) tablet 25 mg (25 mg Oral Given 02/21/24 1136)  rosuvastatin  (CRESTOR ) tablet 20 mg (20 mg Oral Given 02/21/24 2210)  ipratropium-albuterol  (DUONEB) 0.5-2.5 (3) MG/3ML nebulizer solution 6 mL (6 mLs Nebulization Given 02/18/24 2245)  iohexol  (OMNIPAQUE ) 350 MG/ML injection 75 mL (75 mLs Intravenous Contrast Given 02/19/24 0055)   Clinical Course as of 02/21/24 2344  Mon Feb 18, 2024  2329 D-Dimer, Quant(!): 3.25 [JS]    Clinical Course User Index [JS] Kishawn Pickar, PA-C                                Medical Decision  Making Amount and/or Complexity of Data Reviewed Labs: ordered. Decision-making details documented in ED Course. Radiology: ordered.  Risk Prescription drug management. Decision regarding hospitalization.    This patient presents to the ED for concern of hypertension, this involves a number of treatment options, and is a complaint that carries with it a high risk of complications and morbidity.  The differential diagnosis includes hypertensive urgency versus emergency versus infection versus COPD exacerbation.    Co morbidities: Discussed in HPI   Brief History:  See HPI.   EMR reviewed including pt PMHx, past surgical history and past visits to ER.   See HPI for more details   Lab Tests:  I ordered and independently interpreted labs.  The pertinent results include:    CBC with a mild leukocytosis of 10.8, hemoglobin within normal limits. BMP with no electrolyte derangement.  Creatinine levels within normal limits.  First troponin is 17, will obtain delta.  BNP is within normal limits.  Blood cultures have been sent off.   Imaging Studies:  NAD. I personally reviewed all imaging studies and no acute abnormality found. I agree with radiology interpretation.  Cardiac Monitoring:  The patient was maintained on a cardiac monitor.  I personally viewed and interpreted the cardiac monitored which showed an underlying rhythm of: NSR  EKG non-ischemic   Medicines ordered:  I ordered medication including duoneb  for work of breathing  Reevaluation of the patient after these medicines showed that the patient stayed the same I have reviewed the patients home medicines and have made adjustments as needed  Critical Interventions:  Patient with multiple episodes of hypoxia, placed on 2L [place] as levels were as low as 88%.   Reevaluation:  After the interventions noted above I re-evaluated patient and found that they have :stayed the same  Social Determinants  of Health:  The patient's social determinants of health were a factor in the care of this patient  Problem List / ED Course:  Patient presents to the ED with a chief complaint of hypertension, extensive chart review does show patient disposition from the hospital in September 2024.  He had a regimen of lisinopril  10 mg, amlodipine  10 mg, hydrochlorothiazide  25 mg all of these daily. He reports he has not been taking this medication but his roomates are worried about him.  Labs on today's visit with slight elevation of his white blood cell count, hemoglobin stable.  BMP with no electrolyte derangement, current levels unremarkable.  BNP is within normal limits. First troponin is normal, will obtain delta, blood cultures have also been ordered. UDS is pending Chest xray with no acute findings but has some vascular congestion, seeing as patient continues to destat,  and is now requiring 2L [place] will admit for further management.  In the setting of COPD, given Duoneb without much improvement in his symptoms. Considered pulmonary embolism as well with underlying hypoxia, Dimer on todays visit is elevated 3, will obtain CT Angio to r/o PE.      Dispostion:  After consideration of the diagnostic results and the patients response to treatment, I feel that the patent would benefit from admission for further workup of his hypoxia.    Portions of this note were generated with Scientist, clinical (histocompatibility and immunogenetics). Dictation errors may occur despite best attempts at proofreading.   Final diagnoses:  Hypertension, unspecified type  Hypoxia    ED Discharge Orders     None          Jakari Jacot, PA-C 02/21/24  [address]    Neysa Caron PARAS, DO 02/22/24 1523

## 2024-02-18 NOTE — ED Notes (Signed)
 Pt O2 sat fluctuating from 86-90%, pt placed on 2L supplemental oxygen. RN notified.

## 2024-02-18 NOTE — ED Provider Triage Note (Signed)
 Emergency Medicine Provider Triage Evaluation Note  Hermen Mario , a 53 y.o. male  was evaluated in triage.  Pt complains of hypertension.  Review of Systems  Positive: Weeping around wounds and swelling in bilateral lower extremities.  Hypertension and out of meds. Negative: Nausea, vomiting,  Physical Exam  BP (!) 154/82   Pulse 80   Temp 98.5 F (36.9 C) (Oral)   Resp 20   Ht 5' 11 (1.803 m)   Wt 128.4 kg   SpO2 94%   BMI 39.48 kg/m  Gen:   Awake, no distress   Resp:  Normal effort  MSK:   Moves extremities without difficulty  Other:  Bilateral +3 pitting edema with weeping  Medical Decision Making  Medically screening exam initiated at 7:30 PM.  Appropriate orders placed.  Zak Gondek was informed that the remainder of the evaluation will be completed by another provider, this initial triage assessment does not replace that evaluation, and the importance of remaining in the ED until their evaluation is complete.  Labs and imaging ordered   Merryl Abraham 02/18/24 8295

## 2024-02-19 ENCOUNTER — Observation Stay (HOSPITAL_COMMUNITY)

## 2024-02-19 DIAGNOSIS — E8809 Other disorders of plasma-protein metabolism, not elsewhere classified: Secondary | ICD-10-CM | POA: Diagnosis present

## 2024-02-19 DIAGNOSIS — I4719 Other supraventricular tachycardia: Secondary | ICD-10-CM | POA: Diagnosis present

## 2024-02-19 DIAGNOSIS — I160 Hypertensive urgency: Secondary | ICD-10-CM | POA: Diagnosis not present

## 2024-02-19 DIAGNOSIS — D649 Anemia, unspecified: Secondary | ICD-10-CM

## 2024-02-19 DIAGNOSIS — I5033 Acute on chronic diastolic (congestive) heart failure: Secondary | ICD-10-CM | POA: Diagnosis present

## 2024-02-19 DIAGNOSIS — I471 Supraventricular tachycardia, unspecified: Secondary | ICD-10-CM

## 2024-02-19 DIAGNOSIS — R600 Localized edema: Secondary | ICD-10-CM

## 2024-02-19 DIAGNOSIS — G4733 Obstructive sleep apnea (adult) (pediatric): Secondary | ICD-10-CM | POA: Diagnosis present

## 2024-02-19 DIAGNOSIS — F17210 Nicotine dependence, cigarettes, uncomplicated: Secondary | ICD-10-CM | POA: Diagnosis present

## 2024-02-19 DIAGNOSIS — J449 Chronic obstructive pulmonary disease, unspecified: Secondary | ICD-10-CM | POA: Diagnosis present

## 2024-02-19 DIAGNOSIS — I2510 Atherosclerotic heart disease of native coronary artery without angina pectoris: Secondary | ICD-10-CM | POA: Diagnosis present

## 2024-02-19 DIAGNOSIS — D72829 Elevated white blood cell count, unspecified: Secondary | ICD-10-CM | POA: Diagnosis present

## 2024-02-19 DIAGNOSIS — J9811 Atelectasis: Secondary | ICD-10-CM | POA: Diagnosis present

## 2024-02-19 DIAGNOSIS — I472 Ventricular tachycardia, unspecified: Secondary | ICD-10-CM | POA: Diagnosis not present

## 2024-02-19 DIAGNOSIS — I700 Atherosclerosis of aorta: Secondary | ICD-10-CM | POA: Diagnosis present

## 2024-02-19 DIAGNOSIS — Z6839 Body mass index (BMI) 39.0-39.9, adult: Secondary | ICD-10-CM | POA: Diagnosis not present

## 2024-02-19 DIAGNOSIS — F32A Depression, unspecified: Secondary | ICD-10-CM | POA: Diagnosis present

## 2024-02-19 DIAGNOSIS — L03116 Cellulitis of left lower limb: Secondary | ICD-10-CM | POA: Diagnosis present

## 2024-02-19 DIAGNOSIS — R531 Weakness: Secondary | ICD-10-CM | POA: Diagnosis not present

## 2024-02-19 DIAGNOSIS — I110 Hypertensive heart disease with heart failure: Secondary | ICD-10-CM | POA: Diagnosis present

## 2024-02-19 DIAGNOSIS — E440 Moderate protein-calorie malnutrition: Secondary | ICD-10-CM | POA: Diagnosis present

## 2024-02-19 DIAGNOSIS — Z720 Tobacco use: Secondary | ICD-10-CM

## 2024-02-19 DIAGNOSIS — L03115 Cellulitis of right lower limb: Secondary | ICD-10-CM | POA: Diagnosis present

## 2024-02-19 DIAGNOSIS — R0902 Hypoxemia: Secondary | ICD-10-CM | POA: Diagnosis present

## 2024-02-19 DIAGNOSIS — E785 Hyperlipidemia, unspecified: Secondary | ICD-10-CM | POA: Diagnosis present

## 2024-02-19 DIAGNOSIS — E66812 Obesity, class 2: Secondary | ICD-10-CM | POA: Diagnosis present

## 2024-02-19 DIAGNOSIS — J9601 Acute respiratory failure with hypoxia: Secondary | ICD-10-CM

## 2024-02-19 DIAGNOSIS — R001 Bradycardia, unspecified: Secondary | ICD-10-CM | POA: Diagnosis not present

## 2024-02-19 DIAGNOSIS — D509 Iron deficiency anemia, unspecified: Secondary | ICD-10-CM | POA: Diagnosis present

## 2024-02-19 DIAGNOSIS — Z79899 Other long term (current) drug therapy: Secondary | ICD-10-CM | POA: Diagnosis not present

## 2024-02-19 DIAGNOSIS — I161 Hypertensive emergency: Secondary | ICD-10-CM | POA: Diagnosis present

## 2024-02-19 DIAGNOSIS — L03119 Cellulitis of unspecified part of limb: Secondary | ICD-10-CM

## 2024-02-19 LAB — MAGNESIUM
Magnesium: 2.2 mg/dL (ref 1.7–2.4)
Magnesium: 2 mg/dL (ref 1.7–2.4)

## 2024-02-19 LAB — URINALYSIS, COMPLETE (UACMP) WITH MICROSCOPIC
Bacteria, UA: NONE SEEN
Bilirubin Urine: NEGATIVE
Glucose, UA: NEGATIVE mg/dL
Hgb urine dipstick: NEGATIVE
Ketones, ur: NEGATIVE mg/dL
Leukocytes,Ua: NEGATIVE
Nitrite: NEGATIVE
Protein, ur: NEGATIVE mg/dL
Specific Gravity, Urine: 1.032 — ABNORMAL HIGH (ref 1.005–1.030)
pH: 5 (ref 5.0–8.0)

## 2024-02-19 LAB — CBC WITH DIFFERENTIAL/PLATELET
Abs Immature Granulocytes: 0.07 10*3/uL (ref 0.00–0.07)
Basophils Absolute: 0.1 10*3/uL (ref 0.0–0.1)
Basophils Relative: 1 %
Eosinophils Absolute: 0.1 10*3/uL (ref 0.0–0.5)
Eosinophils Relative: 1 %
HCT: 40.6 % (ref 39.0–52.0)
Hemoglobin: 12.8 g/dL — ABNORMAL LOW (ref 13.0–17.0)
Immature Granulocytes: 1 %
Lymphocytes Relative: 13 %
Lymphs Abs: 1.3 10*3/uL (ref 0.7–4.0)
MCH: 26.3 pg (ref 26.0–34.0)
MCHC: 31.5 g/dL (ref 30.0–36.0)
MCV: 83.4 fL (ref 80.0–100.0)
Monocytes Absolute: 1 10*3/uL (ref 0.1–1.0)
Monocytes Relative: 10 %
Neutro Abs: 7.7 10*3/uL (ref 1.7–7.7)
Neutrophils Relative %: 74 %
Platelets: 354 10*3/uL (ref 150–400)
RBC: 4.87 MIL/uL (ref 4.22–5.81)
RDW: 15.9 % — ABNORMAL HIGH (ref 11.5–15.5)
WBC: 10.1 10*3/uL (ref 4.0–10.5)
nRBC: 0 % (ref 0.0–0.2)

## 2024-02-19 LAB — COMPREHENSIVE METABOLIC PANEL WITH GFR
ALT: 19 U/L (ref 0–44)
AST: 17 U/L (ref 15–41)
Albumin: 2.6 g/dL — ABNORMAL LOW (ref 3.5–5.0)
Alkaline Phosphatase: 57 U/L (ref 38–126)
Anion gap: 7 (ref 5–15)
BUN: 10 mg/dL (ref 6–20)
CO2: 28 mmol/L (ref 22–32)
Calcium: 8.8 mg/dL — ABNORMAL LOW (ref 8.9–10.3)
Chloride: 101 mmol/L (ref 98–111)
Creatinine, Ser: 0.99 mg/dL (ref 0.61–1.24)
GFR, Estimated: >60 mL/min (ref >60)
Glucose, Bld: 153 mg/dL — ABNORMAL HIGH (ref 70–99)
Potassium: 3.8 mmol/L (ref 3.5–5.1)
Sodium: 136 mmol/L (ref 135–145)
Total Bilirubin: 0.4 mg/dL (ref 0.0–1.2)
Total Protein: 7.4 g/dL (ref 6.5–8.1)

## 2024-02-19 LAB — IRON AND TIBC
Iron: 23 ug/dL — ABNORMAL LOW (ref 45–182)
Saturation Ratios: 8 % — ABNORMAL LOW (ref 17.9–39.5)
TIBC: 301 ug/dL (ref 250–450)
UIBC: 278 ug/dL

## 2024-02-19 LAB — RESP PANEL BY RT-PCR (RSV, FLU A&B, COVID)  RVPGX2
Influenza A by PCR: NEGATIVE
Influenza B by PCR: NEGATIVE
Resp Syncytial Virus by PCR: NEGATIVE
SARS Coronavirus 2 by RT PCR: NEGATIVE

## 2024-02-19 LAB — PROCALCITONIN: Procalcitonin: <0.1 ng/mL

## 2024-02-19 LAB — FERRITIN: Ferritin: 41 ng/mL (ref 24–336)

## 2024-02-19 LAB — TSH: TSH: 1.032 u[IU]/mL (ref 0.350–4.500)

## 2024-02-19 LAB — C-REACTIVE PROTEIN: CRP: 3.7 mg/dL — ABNORMAL HIGH (ref <1.0)

## 2024-02-19 MED ORDER — HYDRALAZINE HCL 50 MG PO TABS
50.0000 mg | ORAL_TABLET | Freq: Three times a day (TID) | ORAL | Status: DC
  Administered 2024-02-19 – 2024-02-20 (×5): 50 mg via ORAL
  Filled 2024-02-19 (×2): qty 2
  Filled 2024-02-19 (×3): qty 1

## 2024-02-19 MED ORDER — AMLODIPINE BESYLATE 10 MG PO TABS
10.0000 mg | ORAL_TABLET | Freq: Every day | ORAL | Status: DC
  Administered 2024-02-19 – 2024-02-25 (×6): 10 mg via ORAL
  Filled 2024-02-19 (×6): qty 1
  Filled 2024-02-19: qty 2

## 2024-02-19 MED ORDER — NICOTINE 21 MG/24HR TD PT24
21.0000 mg | MEDICATED_PATCH | Freq: Every day | TRANSDERMAL | Status: DC
  Administered 2024-02-19 – 2024-02-25 (×6): 21 mg via TRANSDERMAL
  Filled 2024-02-19 (×7): qty 1

## 2024-02-19 MED ORDER — CEFAZOLIN SODIUM-DEXTROSE 2-4 GM/100ML-% IV SOLN
2.0000 g | Freq: Three times a day (TID) | INTRAVENOUS | Status: AC
  Administered 2024-02-19 – 2024-02-22 (×8): 2 g via INTRAVENOUS
  Filled 2024-02-19 (×13): qty 100

## 2024-02-19 MED ORDER — HYDRALAZINE HCL 20 MG/ML IJ SOLN
10.0000 mg | INTRAMUSCULAR | Status: DC | PRN
  Administered 2024-02-19: 10 mg via INTRAVENOUS
  Filled 2024-02-19: qty 1

## 2024-02-19 MED ORDER — ACETAMINOPHEN 325 MG PO TABS: 650.0000 mg | ORAL_TABLET | Freq: Four times a day (QID) | ORAL | Status: DC | PRN

## 2024-02-19 MED ORDER — ONDANSETRON HCL 4 MG/2ML IJ SOLN: 4.0000 mg | Freq: Four times a day (QID) | INTRAMUSCULAR | Status: DC | PRN

## 2024-02-19 MED ORDER — MELATONIN 3 MG PO TABS: 3.0000 mg | ORAL_TABLET | Freq: Every evening | ORAL | Status: DC | PRN

## 2024-02-19 MED ORDER — IOHEXOL 350 MG/ML SOLN
75.0000 mL | Freq: Once | INTRAVENOUS | Status: AC | PRN
  Administered 2024-02-19: 75 mL via INTRAVENOUS

## 2024-02-19 MED ORDER — ALBUTEROL SULFATE (2.5 MG/3ML) 0.083% IN NEBU: 2.5000 mg | INHALATION_SOLUTION | RESPIRATORY_TRACT | Status: DC | PRN

## 2024-02-19 MED ORDER — FUROSEMIDE 10 MG/ML IJ SOLN
40.0000 mg | Freq: Two times a day (BID) | INTRAMUSCULAR | Status: DC
  Administered 2024-02-19 – 2024-02-20 (×4): 40 mg via INTRAVENOUS
  Filled 2024-02-19 (×5): qty 4

## 2024-02-19 MED ORDER — ACETAMINOPHEN 650 MG RE SUPP: 650.0000 mg | Freq: Four times a day (QID) | RECTAL | Status: DC | PRN

## 2024-02-19 NOTE — ED Notes (Signed)
Patient ambulating with PT.

## 2024-02-19 NOTE — Evaluation (Signed)
 Occupational Therapy Evaluation Patient Details Name: Alex [name] MRN: [id number] DOB: November 06, 1970 Today's Date: 02/19/2024   History of Present Illness   Pt is a 53 y.o. male presenting 02/18/24 with BLE pitting edema, generalized weakness, and weeping wounds. PMH significant for HTN, CVA, HTN, MI, COPD, medication non-compliance.     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 Pt reports difficulty taking care of feet due to inability to reach them and edema. Pt has not been able to wear socks for ~1 year and has decreased frequency of bathing due to falls in shower. Pt mobilizing at a supervision level but needing mod A for LB ADL and min-mod A for simulated tub-shower transfers. Pt reports roommates cannot assist with these tasks. Reviewed AE pt can get with plans to practice in future session. Highly recommending tub transfer bench to optimize ability to bathe and improve hygiene for bil feet to optimize hygiene and reduce instance of wounds.      If plan is discharge home, recommend the following:   A little help with walking and/or transfers;A little help with bathing/dressing/bathroom;Assistance with cooking/housework;Help with stairs or ramp for entrance;Assist for transportation     Functional Status Assessment   Patient has had a recent decline in their functional status and demonstrates the ability to make significant improvements in function in a reasonable and predictable amount of time.     Equipment Recommendations   Tub/shower bench     Recommendations for Other Services         Precautions/Restrictions   Precautions Precautions: Fall Recall of Precautions/Restrictions: Intact Restrictions Weight Bearing Restrictions Per Provider Order: No     Mobility Bed Mobility Overal bed mobility: Needs Assistance Bed Mobility: Supine to Sit     Supine to sit: Supervision, HOB elevated      General bed mobility comments: sup for safety on stretcher    Transfers Overall transfer level: Needs assistance Equipment used: None Transfers: Sit to/from Stand Sit to Stand: Supervision           General transfer comment: sup/CGA for safety, good use of hands for rise and lower      Balance Overall balance assessment: Needs assistance Sitting-balance support: Feet supported Sitting balance-Leahy Scale: Good     Standing balance support: During functional activity, No upper extremity supported Standing balance-Leahy Scale: Fair                             ADL either performed or assessed with clinical judgement   ADL Overall ADL's : Needs assistance/impaired Eating/Feeding: Independent   Grooming: Supervision/safety;Wash/dry hands;Standing   Upper Body Bathing: Set up;Sitting   Lower Body Bathing: Moderate assistance;Sit to/from stand   Upper Body Dressing : Set up;Sitting   Lower Body Dressing: Moderate assistance;Sit to/from stand   Toilet Transfer: Supervision/safety                   Vision Patient Visual Report: No change from baseline       Perception         Praxis         Pertinent Vitals/Pain Pain Assessment Pain Assessment: Faces Faces Pain Scale: Hurts little more Pain Location: bil feet, calves Pain Descriptors / Indicators: Aching     Extremity/Trunk Assessment Upper Extremity Assessment Upper Extremity Assessment: Overall WFL for tasks assessed   Lower Extremity Assessment Lower Extremity Assessment: Defer to PT evaluation  Cervical / Trunk Assessment Cervical / Trunk Assessment: Normal   Communication Communication Communication: No apparent difficulties   Cognition Arousal: Alert Behavior During Therapy: WFL for tasks assessed/performed Cognition: No apparent impairments                               Following commands: Intact       Cueing  General Comments   Cueing Techniques:  Verbal cues  VSS. Discussed methods for improving LE hygiene.   Exercises     Shoulder Instructions      Home Living Family/patient expects to be discharged to:: Private residence Living Arrangements: Non-relatives/Friends (2 roommates)   Type of Home: House Home Access: Stairs to enter Secretary/administrator of Steps: 4 Entrance Stairs-Rails: Right Home Layout: One level     Bathroom Shower/Tub: Chief Strategy Officer: Standard     Home Equipment: None   Additional Comments: has fallen in bathroom (last time ~ 37month ago) report roommates left towels on floor and tripped over them.      Prior Functioning/Environment Prior Level of Function : Independent/Modified Independent;Working/employed (traffic control (Human resources officer))             Mobility Comments: no AD for mobility ADLs Comments: every 3-4 days, (challenging to get legs in and out) no shower seat.    OT Problem List: Decreased strength;Decreased range of motion;Impaired balance (sitting and/or standing)   OT Treatment/Interventions: Self-care/ADL training;Therapeutic exercise;DME and/or AE instruction;Therapeutic activities;Patient/family education;Balance training      OT Goals(Current goals can be found in the care plan section)   Acute Rehab OT Goals Patient Stated Goal: get better OT Goal Formulation: With patient Time For Goal Achievement: 03/04/24 Potential to Achieve Goals: Good   OT Frequency:  Min 1X/week    Co-evaluation              AM-PAC OT 6 Clicks Daily Activity     Outcome Measure Help from another person eating meals?: None Help from another person taking care of personal grooming?: A Little Help from another person toileting, which includes using toliet, bedpan, or urinal?: A Little Help from another person bathing (including washing, rinsing, drying)?: A Little Help from another person to put on and taking off regular upper body clothing?: A  Little Help from another person to put on and taking off regular lower body clothing?: A Little 6 Click Score: 19   End of Session Equipment Utilized During Treatment: Gait belt Nurse Communication: Mobility status  Activity Tolerance: Patient tolerated treatment well Patient left: in bed;with call bell/phone within reach  OT Visit Diagnosis: Unsteadiness on feet (R26.81);Muscle weakness (generalized) (M62.81)                Time: 5284-1324 OT Time Calculation (min): 25 min Charges:  OT General Charges $OT Visit: 1 Visit OT Evaluation $OT Eval Low Complexity: 1 Low OT Treatments $Self Care/Home Management : 8-22 mins  Karilyn Ouch, OTR/L [facility] Acute Rehabilitation Office: [phone]   Emery Hans 02/19/2024, 1:59 PM

## 2024-02-19 NOTE — Progress Notes (Signed)
  Carryover admission to the Day Admitter.  I discussed this case with the EDP, Atlee Leach, PA.  Per these discussions:   This is a 53 year old male with history of essential hypertension, medication noncompliance, who is being admitted with generalized weakness with new supplemental oxygen requirement at resenting with generalized weakness.  The patient conveys that he recently independently stopped all of his outpatient medications, including his home blood pressure medications that included amlodipine .  It is unclear at this time why he stopped taking all of these medications, but denied to EDP any suicidal ideations.  Send for his generalized weakness, he is without acute complaint, including no chest pain or shortness of breath.  Vital signs in the ED were notable for afebrile, heart rates in the 60s to 80s; systolic blood pressures in the 160s to 180s mmHg. oxygen saturations in the mid 80s on room air, S[place]an improving into the mid 90s on 2 L nasal cannula.  CBC showed blood cell count 10,800.  D-dimer was elevated.  BNP 67.  Urine drug screen is currently pending.  Chest x-ray showed low lung volumes with mild bibasilar atelectasis, but otherwise no evidence of acute cardiopulmonary process.  EDP ordered CTA PE study, which is currently pending.  I have placed an order for observation for further evaluation management of the above.   I have placed some additional preliminary admit orders via the adult multi-morbid admission order set.  For his generalized weakness, have also ordered urinalysis, full Russians, as well as PT/OT consults for the morning.  Regarding his mildly elevated blood pressure, I have resumed his home Norvasc  and added as needed IV hydralazine .  Additionally, in the setting of his new supplemental oxygen requirement, ordered prn albuterol nebulizer, incentive spirometry added on a procalcitonin level.  Also ordered morning labs in the form of CMP, CBC, magnesium  level.   Camelia Cavalier, DO Hospitalist

## 2024-02-19 NOTE — TOC Progression Note (Signed)
 Transition of Care [facility]) - Progression Note    Patient Details  Name: Alex [name] MRN: [id number] Date of Birth: 1971/09/04  Transition of [facility]) CM/SW Contact  Katrine Parody, LCSW Phone Number: 02/19/2024, 3:14 PM  Clinical Narrative:    CSW received a [place] asking if [employer] covered for pt.  Consulted with [employer] for DME. [place] typically covered by Medicaid but pt's on [place] insurance should also reach out the [place] to determine if covered.  CSW sent  back with info.        Expected Discharge Plan and Services                                               Social Determinants of Health (SDOH) Interventions SDOH Screenings   Food Insecurity: Food Insecurity Present (05/21/2023)  Housing: Low Risk  (05/21/2023)  Transportation Needs: Unmet Transportation Needs (05/21/2023)  Utilities: Not At Risk (05/21/2023)  Depression (PHQ2-9): Medium Risk (02/10/2022)  Tobacco Use: High Risk (02/18/2024)    Readmission Risk Interventions    11/22/2021   11:28 AM  Readmission Risk Prevention Plan  Post Dischage Appt Not Complete  Appt Comments [facility], he is aware of this and verbalizes agreement to do so  Medication Screening Complete  Transportation Screening Complete

## 2024-02-19 NOTE — Evaluation (Signed)
 Physical Therapy Evaluation Patient Details Name: Alex [name] MRN: [id number] DOB: 09-22-1970 Today's Date: 02/19/2024  History of Present Illness  Pt is a 53 y.o. male presenting 02/18/24 with BLE pitting edema, generalized weakness, and weeping wounds. PMH significant for HTN, CVA, HTN, MI, COPD, medication non-compliance.  Clinical Impression  Alex [name] is 53 y.o. male admitted with above HPI and diagnosis. Patient is currently limited by functional impairments below (see PT problem list). Patient lives with roommates and is independent with no AD for mobility at baseline but struggles with ADLs for self care. Pt currently limited by LE edema, weakness, and pain, requires sup/CGA for all functional mobility at this time. Patient will benefit from continued skilled PT interventions to address impairments and progress independence with mobility, recommending HH f/u for OT/PT and wound care. Acute PT will follow and progress as able.         If plan is discharge home, recommend the following: Help with stairs or ramp for entrance;Assist for transportation;Direct supervision/assist for medications management;A little help with bathing/dressing/bathroom   Can travel by private vehicle        Equipment Recommendations Other (comment) (tub bench)  Recommendations for Other Services       Functional Status Assessment Patient has had a recent decline in their functional status and demonstrates the ability to make significant improvements in function in a reasonable and predictable amount of time.     Precautions / Restrictions Precautions Precautions: Fall Recall of Precautions/Restrictions: Intact Restrictions Weight Bearing Restrictions Per Provider Order: No      Mobility  Bed Mobility Overal bed mobility: Needs Assistance Bed Mobility: Supine to Sit     Supine to sit: Supervision, HOB elevated     General bed mobility comments: sup for safety on stretcher     Transfers Overall transfer level: Needs assistance Equipment used: None Transfers: Sit to/from Stand Sit to Stand: Supervision           General transfer comment: sup/CGA for safety, good use of hands for rise and lower    Ambulation/Gait Ambulation/Gait assistance: Contact guard assist Gait Distance (Feet): 120 Feet Assistive device: None Gait Pattern/deviations: Step-through pattern, Decreased stride length, Wide base of support, Antalgic Gait velocity: decr     General Gait Details: mild antalgia with greater discomfort on Lt LE with gait, wide BOS, no buckling or overt LOB  Stairs            Wheelchair Mobility     Tilt Bed    Modified Rankin (Stroke Patients Only)       Balance Overall balance assessment: Needs assistance Sitting-balance support: Feet supported Sitting balance-Leahy Scale: Good     Standing balance support: During functional activity, No upper extremity supported Standing balance-Leahy Scale: Fair                               Pertinent Vitals/Pain Pain Assessment Pain Assessment: Faces    Home Living Family/patient expects to be discharged to:: Private residence Living Arrangements: Non-relatives/Friends (2 roommates)   Type of Home: House Home Access: Stairs to enter Entrance Stairs-Rails: Right Entrance Stairs-Number of Steps: 4   Home Layout: One level Home Equipment: None Additional Comments: has fallen in bathroom (last time ~ 53month ago) report roommates left towels on floor and tripped over them.    Prior Function Prior Level of Function : Independent/Modified Independent;Working/employed (traffic control (Human resources officer))  Mobility Comments: no AD for mobility ADLs Comments: every 3-4 days, (challenging to get legs in and out) no shower seat.     Extremity/Trunk Assessment   Upper Extremity Assessment Upper Extremity Assessment: Defer to OT evaluation    Lower Extremity  Assessment Lower Extremity Assessment: Generalized weakness;RLE deficits/detail;LLE deficits/detail RLE Deficits / Details: not formmlay tested due to LE wounds and pain LLE Deficits / Details: not formmlay tested due to LE wounds and pain    Cervical / Trunk Assessment Cervical / Trunk Assessment: Normal  Communication   Communication Communication: No apparent difficulties    Cognition Arousal: Alert Behavior During Therapy: WFL for tasks assessed/performed   PT - Cognitive impairments: No apparent impairments                         Following commands: Intact       Cueing Cueing Techniques: Verbal cues     General Comments      Exercises     Assessment/Plan    PT Assessment Patient needs continued PT services  PT Problem List Decreased strength;Decreased activity tolerance;Decreased balance;Decreased mobility;Decreased knowledge of use of DME;Decreased safety awareness;Decreased knowledge of precautions;Cardiopulmonary status limiting activity;Obesity;Pain;Decreased skin integrity       PT Treatment Interventions DME instruction;Gait training;Stair training;Functional mobility training;Therapeutic activities;Therapeutic exercise;Balance training;Patient/family education    PT Goals (Current goals can be found in the Care Plan section)  Acute Rehab PT Goals Patient Stated Goal: wound to heal and take better care of self PT Goal Formulation: With patient Time For Goal Achievement: 03/04/24 Potential to Achieve Goals: Good    Frequency Min 1X/week     Co-evaluation               AM-PAC PT 6 Clicks Mobility  Outcome Measure Help needed turning from your back to your side while in a flat bed without using bedrails?: A Little Help needed moving from lying on your back to sitting on the side of a flat bed without using bedrails?: A Little Help needed moving to and from a bed to a chair (including a wheelchair)?: A Little Help needed standing up  from a chair using your arms (e.g., wheelchair or bedside chair)?: A Little Help needed to walk in hospital room?: A Little Help needed climbing 3-5 steps with a railing? : A Lot 6 Click Score: 17    End of Session Equipment Utilized During Treatment: Gait belt Activity Tolerance: Patient tolerated treatment well Patient left: in bed;with call bell/phone within reach Nurse Communication: Mobility status PT Visit Diagnosis: Other abnormalities of gait and mobility (R26.89);Muscle weakness (generalized) (M62.81);Difficulty in walking, not elsewhere classified (R26.2);Pain Pain - Right/Left: Left (bil) Pain - part of body: Leg    Time: 3875-6433 PT Time Calculation (min) (ACUTE ONLY): 22 min   Charges:   PT Evaluation $PT Eval Low Complexity: 1 Low   PT General Charges $$ ACUTE PT VISIT: 1 Visit         Tish Forge, DPT Acute Rehabilitation Services Office [phone]  02/19/24 11:10 AM

## 2024-02-19 NOTE — H&P (Signed)
 History and Physical    Patient: Alex [name] ZOX:[id number] DOB: [birth year] DOA: 02/18/2024 DOS: the patient was seen and examined on 02/19/2024 PCP: [facility]  Patient coming from: Home  Chief Complaint:  Chief Complaint  Patient presents with   Hypertension   HPI: Alex [name] is a 53 y.o. male with medical history significant of hypertension, CVA, COPD, tobacco abuse, and obesity who presented with complaints of leg swelling.  He has significant swelling and redness in his legs and feet. He denies chest pain but feels feverish at times, although he is uncertain about having an actual fever.  His lifestyle is marked by social isolation. He describes himself as 'inclusive' and does not leave his room much. He goes to work but does not engage in other activities due to financial constraints. A wellness check was initiated by the health department due to concerns about his living conditions.  Does admit to being depressed but denies any suicidal ideations.  He smokes about a pack and a half of cigarettes per day and has not experienced any other pains aside from the leg swelling.  In the ED patient was noted to be afebrile with tachypnea, blood pressures elevated up to 213/105, and O2 saturations as low as 84% with improvement on 2 L of nasal cannula oxygen.  Labs since 6/16 significant for WBC 10.8, hemoglobin 12.8, high-sensitivity troponin 17-> 20, D-dimer 3.25, alcohol level undetectable, and BNP 67.  Chest x-ray showed low lung volumes with bilateral bibasilar atelectasis.  Urinalysis showed no signs for infection, but had elevated specific gravity of 1.032.  Due to the elevated D-dimer a CT angiogram of the chest have been obtained which showed no evidence of a pulmonary embolism and moderate hepatic steatosis.  Blood cultures have been obtained.  Patient has been given an DuoNeb breathing treatment.  He was noted to have a 22nd episode of V. tach with conversion back into  a normal sinus rhythm.  Review of Systems: As mentioned in the history of present illness. All other systems reviewed and are negative. Past Medical History:  Diagnosis Date   HTN (hypertension)    Stroke [facility])    Past Surgical History:  Procedure Laterality Date   HAND SURGERY Right    TONSILLECTOMY     Social History:  reports that he has been smoking cigarettes. He has a 40 pack-year smoking history. He has never used smokeless tobacco. He reports current alcohol use of about 6.0 - 12.0 standard drinks of alcohol per week. He reports that he does not currently use drugs after having used the following drugs: Methamphetamines and Marijuana.  No Known Allergies  Family History  Problem Relation Age of Onset   Diabetes Mother    Hypotension Mother    Heart disease Father    Hypertension Father     Prior to Admission medications   Medication Sig Start Date End Date Taking? Authorizing Provider  acetaminophen  (TYLENOL ) 500 MG tablet Take 2 tablets (1,000 mg total) by mouth every 6 (six) hours for 4 days then as directed by MD 05/22/23   Cleven Dallas, DO  albuterol (VENTOLIN HFA) 108 (90 Base) MCG/ACT inhaler Inhale 2 puffs into the lungs every 6 (six) hours as needed for wheezing or shortness of breath.    [provider]  amLODipine  (NORVASC ) 10 MG tablet Take 10 mg by mouth daily.    [provider]  amoxicillin -clavulanate (AUGMENTIN ) 875-125 MG tablet Take 1 tablet by mouth 2 (two) times daily. 05/23/23  Cleven Dallas, DO  aspirin  81 MG EC tablet Take 1 tablet (81 mg total) by mouth daily. Swallow whole. 11/22/21   Danford, Willis Harter, MD  hydrALAZINE  (APRESOLINE ) 50 MG tablet Take 1 tablet (50 mg total) by mouth 3 (three) times daily. 01/11/22   Lawrance Presume, MD  hydrochlorothiazide  (HYDRODIURIL ) 25 MG tablet Take 1 tablet (25 mg total) by mouth daily. 11/22/21 07/14/23  Danford, Willis Harter, MD  lisinopril  (ZESTRIL ) 10 MG tablet Take 1 tablet (10 mg  total) by mouth daily. 05/23/23   Cleven Dallas, DO  rosuvastatin  (CRESTOR ) 20 MG tablet Take 10 mg by mouth daily.    [provider]  Tiotropium Bromide  Monohydrate 2.5 MCG/ACT AERS Inhale 1 puff into the lungs daily.    [provider]    Physical Exam: Vitals:   02/19/24 0530 02/19/24 0600 02/19/24 0630 02/19/24 0700  BP: (!) 163/68 (!) 166/86 (!) 177/67 (!) 190/100  Pulse: 74 (!) 49 (!) 52 88  Resp: 16 15 (!) 24 20  Temp:      TempSrc:      SpO2: (!) 87% 93% 98% 97%  Weight:      Height:       Constitutional: Obese middle-age male who is unkept currently in no acute distress sitting up in the bed. Eyes: PERRL,  ENMT: Mucous membranes are moist. Posterior pharynx clear of any exudate or lesions.    Neck: normal, supple  Respiratory: clear to auscultation bilaterally, no wheezing, no crackles. Normal respiratory effort.  Cardiovascular: Regular rate and rhythm, no murmurs / rubs / gallops.  3+ pitting bilateral lower extremity edema present. Abdomen: no tenderness, no masses palpated.  Bowel sounds positive.  Musculoskeletal: no clubbing / cyanosis. No joint deformity upper and lower extremities. Good ROM, no contractures. Normal muscle tone.  Skin: Erythema noted of the lower extremities. Neurologic: CN 2-12 grossly intact. Sensation intact, DTR normal. Strength 5/5 in all 4.  Psychiatric: Normal judgment and insight. Alert and oriented x 3. Normal mood.   Data Reviewed:  EKG revealed normal sinus rhythm 83 bpm with ST wave changes.  Reviewed labs, imaging, and pertinent records as documented. Assessment and Plan:  Acute respiratory failure with hypoxia Patient presents with acute respiratory failure with hypoxia.  O2 saturations noted to be as low as 84% on room air for which patient was placed on 2 L nasal cannula oxygen.  Chest x-ray showed Low lung volumes with mild bibasilar atelectasis.  CT angiogram of the chest did not reveal any signs for pulmonary  embolism or acute findings in the chest. - Admit to a progressive bed - Continuous pulse oximetry with oxygen maintain O2 saturation greater than 92%. - Check procalcitonin - Breathing treatments as needed  Hypertensive urgency On admission blood pressures elevated up to 213/105.  Patient had stopped taking all of his blood pressure medications which previously included amlodipine  10 mg daily, hydralazine  50 mg 3 times daily, we will send refill 10 mg daily, and hydrochlorothiazide  25 mg daily - Resume amlodipine  and hydralazine  - Hydralazine  IV as needed  Bilateral lower extremity swelling Possible cellulitis Acute.  Patient has at least 3+ pitting bilateral lower extremity edema with surrounding erythema.  Question possibility of acute infection.  Urinalysis did not show significant signs for proteinuria.  Cause of swelling  possibly diastolic CHF versus vascular insufficiency versus hypoalbuminemia. - Elevate lower extremities - Check CRP - Lasix  40 mg IV twice daily - Empiric antibiotics as cefazolin  Diastolic congestive heart failure  Suspect acute on chronic.  Patient with at least 3+ pitting edema noted on the bilateral lower extremities.  BNP was 67.  Last echocardiogram noted EF to be 60 to 65% with grade 1 diastolic dysfunction when checked last in 2023.  Other causes for symptoms include hypoalbuminemia - Strict I&O's and daily weights - Check echocardiogram - Lasix  as noted above.  Reassess diuresis in a.m. adjust as needed  Leukocytosis Acute.  Initial white blood cell count elevated at 10.8, but repeat check within normal limits at 10.1.  Thought possibly related to above. - Check CRP  PSVT Patient was noted to have a 22nd episode of SVT while in the emergency department that spontaneous converted back into a normal sinus rhythm. - Follow-up telemetry overnight  Normocytic anemia Hemoglobin noted to be 12.8 when checked today with normal MCV and MCH, but elevated RDW  of 15.9. - Check iron studies - Recheck CBC in a.m.  Generalized weakness Patient reports having issues with getting up and moving around due to lower extremity swelling. - Check TSH - PT to evaluate and treat  Depression Patient denies any reports of wanting to hurt himself.  Tobacco abuse Patient reports smoking a pack and a half of cigarettes per day on average. - Continue patch offered   DVT prophylaxis: Lovenox  Advance Care Planning:   Code Status: Full Code   Consults: None  Family Communication: None  Severity of Illness: The appropriate patient status for this patient is INPATIENT. Inpatient status is judged to be reasonable and necessary in order to provide the required intensity of service to ensure the patient's safety. The patient's presenting symptoms, physical exam findings, and initial radiographic and laboratory data in the context of their chronic comorbidities is felt to place them at high risk for further clinical deterioration. Furthermore, it is not anticipated that the patient will be medically stable for discharge from the hospital within 2 midnights of admission.   * I certify that at the point of admission it is my clinical judgment that the patient will require inpatient hospital care spanning beyond 2 midnights from the point of admission due to high intensity of service, high risk for further deterioration and high frequency of surveillance required.*  Author: Lena Qualia, MD 02/19/2024 7:58 AM  For on call review [email or web address].

## 2024-02-19 NOTE — ED Notes (Signed)
 This nurse was notified by CCMD that patient had a 20 second episode of Vtach with conversion back to NSR. Patient asleep but has no complaints when woken up.  Howerter, MD notified of event. Will obtain EKG.

## 2024-02-20 DIAGNOSIS — R531 Weakness: Secondary | ICD-10-CM

## 2024-02-20 LAB — BLOOD CULTURE ID PANEL (REFLEXED) - BCID2

## 2024-02-20 LAB — BASIC METABOLIC PANEL WITH GFR
Anion gap: 7 (ref 5–15)
BUN: 10 mg/dL (ref 6–20)
CO2: 28 mmol/L (ref 22–32)
Calcium: 8.6 mg/dL — ABNORMAL LOW (ref 8.9–10.3)
Chloride: 100 mmol/L (ref 98–111)
Creatinine, Ser: 0.93 mg/dL (ref 0.61–1.24)
GFR, Estimated: >60 mL/min (ref >60)
Glucose, Bld: 103 mg/dL — ABNORMAL HIGH (ref 70–99)
Potassium: 3.9 mmol/L (ref 3.5–5.1)
Sodium: 135 mmol/L (ref 135–145)

## 2024-02-20 LAB — BRAIN NATRIURETIC PEPTIDE: B Natriuretic Peptide: 62.7 pg/mL (ref 0.0–100.0)

## 2024-02-20 LAB — PROCALCITONIN: Procalcitonin: <0.1 ng/mL

## 2024-02-20 MED ORDER — FERROUS SULFATE 325 (65 FE) MG PO TABS
325.0000 mg | ORAL_TABLET | Freq: Two times a day (BID) | ORAL | Status: DC
  Administered 2024-02-20 – 2024-02-25 (×8): 325 mg via ORAL
  Filled 2024-02-20 (×11): qty 1

## 2024-02-20 MED ORDER — SACUBITRIL-VALSARTAN 49-51 MG PO TABS
1.0000 | ORAL_TABLET | Freq: Two times a day (BID) | ORAL | Status: DC
  Administered 2024-02-20: 1 via ORAL
  Filled 2024-02-20 (×2): qty 1

## 2024-02-20 MED ORDER — ENOXAPARIN SODIUM 40 MG/0.4ML IJ SOSY
40.0000 mg | PREFILLED_SYRINGE | Freq: Every day | INTRAMUSCULAR | Status: DC
  Administered 2024-02-20 – 2024-02-24 (×4): 40 mg via SUBCUTANEOUS
  Filled 2024-02-20 (×5): qty 0.4

## 2024-02-20 MED ORDER — DOCUSATE SODIUM 100 MG PO CAPS
200.0000 mg | ORAL_CAPSULE | Freq: Every day | ORAL | Status: DC
  Administered 2024-02-22 – 2024-02-25 (×4): 200 mg via ORAL
  Filled 2024-02-20 (×5): qty 2

## 2024-02-20 MED ORDER — ASPIRIN 81 MG PO TBEC
81.0000 mg | DELAYED_RELEASE_TABLET | Freq: Every day | ORAL | Status: DC
  Administered 2024-02-20 – 2024-02-25 (×5): 81 mg via ORAL
  Filled 2024-02-20 (×6): qty 1

## 2024-02-20 NOTE — Progress Notes (Signed)
 Orthopedic Tech Progress Note Patient Details:  Alex [name] 20-Aug-1971 [id number]  Ortho Devices Type of Ortho Device: Ace wrap, Unna boot Ortho Device/Splint Location: BLE Ortho Device/Splint Interventions: Ordered, Application, Adjustmentapplied UNNA BOOTS, once finished emptied out urinal for patient and it had in bottle    Post Interventions Patient Tolerated: Well Instructions Provided: Care of device  Kermitt Pedlar 02/20/2024, 4:12 PM

## 2024-02-20 NOTE — Plan of Care (Signed)
  Problem: Education: Goal: Knowledge of General Education information will improve Description: Including pain rating scale, medication(s)/side effects and non-pharmacologic comfort measures Outcome: Not Progressing   Problem: Health Behavior/Discharge Planning: Goal: Ability to manage health-related needs will improve Outcome: Not Progressing   Problem: Clinical Measurements: Goal: Ability to maintain clinical measurements within normal limits will improve Outcome: Not Progressing Goal: Will remain free from infection Outcome: Not Progressing Goal: Diagnostic test results will improve Outcome: Not Progressing Goal: Respiratory complications will improve Outcome: Not Progressing Goal: Cardiovascular complication will be avoided Outcome: Not Progressing   Problem: Activity: Goal: Risk for activity intolerance will decrease Outcome: Not Progressing   Problem: Nutrition: Goal: Adequate nutrition will be maintained Outcome: Not Progressing   Problem: Coping: Goal: Level of anxiety will decrease Outcome: Not Progressing   Problem: Elimination: Goal: Will not experience complications related to bowel motility Outcome: Not Progressing Goal: Will not experience complications related to urinary retention Outcome: Not Progressing   Problem: Pain Managment: Goal: General experience of comfort will improve and/or be controlled Outcome: Not Progressing   Problem: Safety: Goal: Ability to remain free from injury will improve Outcome: Not Progressing   Problem: Skin Integrity: Goal: Risk for impaired skin integrity will decrease Outcome: Not Progressing   Problem: Clinical Measurements: Goal: Ability to avoid or minimize complications of infection will improve Outcome: Not Progressing   Problem: Skin Integrity: Goal: Skin integrity will improve Outcome: Not Progressing

## 2024-02-20 NOTE — Consult Note (Signed)
 Cardiology Consultation   Patient ID: Alex [name] MRN: [id number]; DOB: 06-16-1971  Admit date: 02/18/2024 Date of Consult: 02/20/2024  PCP:  Clinic, [facility] Providers Cardiologist:  New to Dr. Paulita Boss  History of Present Illness: Mr. Kresse s a 53 year old with hypertension, prior stroke, and COPD who presents with leg swelling.  He has experienced swelling in his legs and feet for about a year, describing his legs as 'screaming' with discomfort. He has not been taking any medications regularly for the past few months.  He has a history of hypertension and presented with a hypertensive emergency requiring two liters of oxygen. Current medications include amlodipine  and hydralazine .  He has a history of coronary artery disease with notable coronary artery calcifications and aortic atherosclerosis. He has not been taking aspirin  or rosuvastatin  regularly for the past few months. No significant chest pain is reported.  He has a history of COPD and was found to have airspace disease consistent with COPD on a CT pulmonary embolism study. He experiences some difficulty breathing, which he attributes to lying down frequently. He smokes about half a pack a day since he was between 18 and 15 years old.  He reports significant financial constraints impacting his ability to engage in activities and maintain adequate nutrition, contributing to malnutrition. He describes himself as a 'homebody' due to financial limitations and has not been attending [facility] appointments regularly. He lives with a roommate who recently had surgery, further impacting his financial situation. 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  He reports a headache, leg swelling, and some difficulty breathing. No significant chest pain.   Past Medical History:  Diagnosis Date   HTN (hypertension)    Stroke [facility])     Past  Surgical History:  Procedure Laterality Date   HAND SURGERY Right    TONSILLECTOMY       Home Medications: none- he has not taken any medications save for inpatient in over two months.  Scheduled Meds:  amLODipine   10 mg Oral Daily   docusate sodium  200 mg Oral Daily   enoxaparin  (LOVENOX ) injection  40 mg Subcutaneous QHS   ferrous sulfate  325 mg Oral BID WC   furosemide   40 mg Intravenous BID   hydrALAZINE   50 mg Oral TID   nicotine   21 mg Transdermal Daily   Continuous Infusions:   ceFAZolin (ANCEF) IV 2 g (02/20/24 1420)   PRN Meds: acetaminophen  **OR** acetaminophen , albuterol, hydrALAZINE , melatonin, ondansetron  (ZOFRAN ) IV  Allergies:   No Known Allergies  Social History:   Social History   Socioeconomic History   Marital status: Divorced    Spouse name: Not on file   Number of children: Not on file   Years of education: Not on file   Highest education level: Not on file  Occupational History   Not on file  Tobacco Use   Smoking status: Every Day    Current packs/day: 1.00    Average packs/day: 1 pack/day for 40.0 years (40.0 ttl pk-yrs)    Types: Cigarettes   Smokeless tobacco: Never  Substance and Sexual Activity   Alcohol use: Yes    Alcohol/week: 6.0 - 12.0 standard drinks of alcohol    Types: 6 - 12 Cans of beer per week    Comment: socially   Drug use: Not Currently    Types: Methamphetamines, Marijuana    Comment: 1 month prior  to admission   Sexual activity: Not on file  Other Topics Concern   Not on file  Social History Narrative   Right handed   Lives with roommates   Caffeine use: coffee daily, mostly water otherwise   Social Drivers of Health   Financial Resource Strain: Not on file  Food Insecurity: Food Insecurity Present (02/19/2024)   Hunger Vital Sign    Worried About Running Out of Food in the Last Year: Sometimes true    Ran Out of Food in the Last Year: Sometimes true  Transportation Needs: No Transportation Needs  (02/19/2024)   PRAPARE - Administrator, Civil Service (Medical): No    Lack of Transportation (Non-Medical): No  Physical Activity: Not on file  Stress: Not on file  Social Connections: Not on file  Intimate Partner Violence: Not At Risk (02/19/2024)   Humiliation, Afraid, Rape, and Kick questionnaire    Fear of Current or Ex-Partner: No    Emotionally Abused: No    Physically Abused: No    Sexually Abused: No    Family History:    Family History  Problem Relation Age of Onset   Diabetes Mother    Hypotension Mother    Heart disease Father    Hypertension Father      ROS:  Please see the history of present illness.      Physical Exam/Data: Vitals:   02/19/24 2339 02/20/24 0445 02/20/24 0800 02/20/24 1209  BP: (!) 118/50 126/88 (!) 159/100 (!) 159/90  Pulse: 65 87  82  Resp: 18 19 20 19   Temp: 98.5 F (36.9 C) 98.5 F (36.9 C) 97.7 F (36.5 C) 98.1 F (36.7 C)  TempSrc: Oral Oral Oral Oral  SpO2: (!) 87% 97% 93% 94%  Weight:  129 kg    Height:        Intake/Output Summary (Last 24 hours) at 02/20/2024 1457 Last data filed at 02/20/2024 0446 Gross per 24 hour  Intake 240 ml  Output 1450 ml  Net -1210 ml      02/20/2024    4:45 AM 02/18/2024    7:28 PM 05/21/2023   11:01 AM  Last 3 Weights  Weight (lbs) 284 lb 6.3 oz 283 lb 1.1 oz 283 lb  Weight (kg) 129 kg 128.4 kg 128.368 kg     Body mass index is 39.66 kg/m.  General:  Chronically ill with morbid obesity HEENT: normal Neck: + JVD Cardiac:  normal S1, S2; RRR; no murmur but distant heart sounds Lungs:  crackles right base, no wheezing, rhonchi or rales  Abd: soft, nontender, but with dullness to percussion  Ext: +2 edema into thigh; wrapped for cellulitis Musculoskeletal:  No deformities, BUE and BLE strength normal and equal Skin: warm and dry  Neuro:  CNs 2-12 intact, no focal abnormalities noted Psych:  Normal affect    Telemetry:  Telemetry was personally reviewed and demonstrates:   SR  Relevant CV Studies:  LABS WBC: 10.8 (02/20/2024) Troponin: no significant elevation (02/20/2024) BNP: minimal elevation (02/20/2024) Calcium : slightly decreased (02/20/2024) Albumin: 2.6 (02/20/2024)  RADIOLOGY CT Pulmonary Angiogram: airspace disease consistent with COPD, moderate dilation of main pulmonary artery (31 mm), no pulmonary embolism, coronary artery calcifications in LAD, circumflex aortic, aortic atherosclerosis, likely dilation of right ventricle, pleural effusions in right base  (Personally reviewed)  DIAGNOSTIC EKG: sinus rhythm with septal T wave inversions (02/19/2024) EKG: lateral ST depressions (02/18/2024) Echocardiogram: normal ventricular function 2023  Laboratory Data: High Sensitivity Troponin:  Recent Labs  Lab 02/18/24 1931 02/18/24 2141  TROPONINIHS 17 20*     Chemistry Recent Labs  Lab 02/18/24 1931 02/18/24 2141 02/19/24 0600 02/20/24 0349  NA 136  --  136 135  K 4.0  --  3.8 3.9  CL 100  --  101 100  CO2 27  --  28 28  GLUCOSE 107*  --  153* 103*  BUN 11  --  10 10  CREATININE 0.95  --  0.99 0.93  CALCIUM  9.5  --  8.8* 8.6*  MG  --  2.2 2.0  --   GFRNONAA >60  --  >60 >60  ANIONGAP 9  --  7 7    Recent Labs  Lab 02/19/24 0600  PROT 7.4  ALBUMIN 2.6*  AST 17  ALT 19  ALKPHOS 57  BILITOT 0.4    Hematology Recent Labs  Lab 02/18/24 1931 02/19/24 0608  WBC 10.8* 10.1  RBC 5.13 4.87  HGB 13.4 12.8*  HCT 42.8 40.6  MCV 83.4 83.4  MCH 26.1 26.3  MCHC 31.3 31.5  RDW 15.7* 15.9*  PLT 391 354   Thyroid  Recent Labs  Lab 02/19/24 0600  TSH 1.032    BNP Recent Labs  Lab 02/18/24 1931  BNP 67.0    DDimer  Recent Labs  Lab 02/18/24 2243  DDIMER 3.25*    Radiology/Studies:  CT Angio Chest PE W and/or Wo Contrast Result Date: 02/19/2024 EXAM: CTA of the Chest with contrast for PE 02/19/2024 12:54:21 AM TECHNIQUE: CTA of the chest was performed after the administration of intravenous contrast.  Multiplanar reformatted images are provided for review. MIP images are provided for review. Automated exposure control, iterative reconstruction, and/or weight based adjustment of the mA/kV was utilized to reduce the radiation dose to as low as reasonably achievable. COMPARISON: Chest radiograph dated 02/18/2024. CLINICAL HISTORY: Pulmonary embolism (PE) suspected, high prob. SOB. FINDINGS: PULMONARY ARTERIES: Pulmonary arteries are adequately opacified for evaluation. No evidence of pulmonary embolism. MEDIASTINUM: The heart and pericardium demonstrate no acute abnormality. Although not tailored for evaluation of the thoracic aorta, there is no evidence of thoracic aneurysm or dissection. Mild thoracic aortic atherosclerosis. Moderate 3-vessel coronary atherosclerosis. LYMPH NODES: No mediastinal, hilar or axillary lymphadenopathy. LUNGS AND PLEURA: Mild centrilobular and paraseptal emphysematous changes, upper lung predominant. Calcified granuloma in the anterior left upper lobe (image 61), benign. Mild subpleural scarring in the bilateral lower lobes, likely postinfectious/inflammatory. No pleural effusion or pneumothorax. UPPER ABDOMEN: Moderate hepatic steatosis. SOFT TISSUES AND BONES: No acute bone or soft tissue abnormality. IMPRESSION: 1. No evidence of pulmonary embolism. 2. Negative CT chest. 3. Moderate hepatic steatosis. Electronically signed by: Zadie Herter MD 02/19/2024 12:59 AM EDT RP Workstation: [id number]   DG Chest Port 1 View Result Date: 02/18/2024 CLINICAL DATA:  Chest pain. EXAM: PORTABLE CHEST 1 VIEW COMPARISON:  May 21, 2023 FINDINGS: The heart size and mediastinal contours are within normal limits. Low lung volumes are noted. Mild atelectasis is suspected within the bilateral lung bases. No pleural effusion or pneumothorax is identified. The visualized skeletal structures are unremarkable. IMPRESSION: Low lung volumes with mild bibasilar atelectasis. Electronically Signed    By: Virgle Grime M.D.   On: 02/18/2024 20:36     Assessment and Plan:  Hypertensive emergency Heart failure of unclear etiology or duration - Presented with hypertensive emergency requiring oxygen now to room air. Blood pressure significantly elevated. Currently on amlodipine  and hydralazine . Plan to discontinue amlodipine  in the future for more  targeted therapies. Entresto initiation because of confirmed no recent lisinopril  use. Discussed the importance of controlling blood pressure and the potential benefits of Entresto in managing heart failure symptoms and reducing fluid overload. - NYHA IV, Hypervolemic - Echo is pending - start Entresto 49-51, he notes he can get free medications through the [place] - Continue hydralazine  50 mg TID until echo - continue current lasix , may escalation dose based on ARNI response - UNNA boot ordered - minimal alcohol use recently related to finances; do not suspect withdrawal or alcohol medication cardiomyopathy  Coronary artery disease Coronary artery calcifications in LAD, circumflex, and aortic with concurrent aortic atherosclerosis seen on CTPE. Aspirin  81 mg ordered. Fasting lipid panel ordered. Echocardiogram pending to assess heart function. Discussed the significance of coronary artery disease and the role of aspirin  in reducing the risk of cardiovascular events. - Order aspirin  81 mg - Order fasting lipid panel - Await echocardiogram results  Chronic obstructive pulmonary disease (COPD) Tobacco use disorder- started age ~45 - COPD with moderate dilation of the main pulmonary artery on CT. Discussed the impact of COPD on overall health and the importance of smoking cessation in managing symptoms. - he is contemplative of getting rid of his cigarettes that he has with him in the room; nictotine patch as per primary  Cellulitis - as per primary  Morbid obesity - will improve with diuresis  Malnutrition - Significant malnutrition indicated  by low albumin level of 2.6. Discussed dietary challenges due to financial constraints. Emphasized the importance of nutritional support to improve overall health and recovery. - food insecurity noted; consult to transitions or care team  Social isolation Social isolation due to financial constraints. Discussed potential VA resources to assist with social and financial support.   Patient has several pounds of fluid on him due to a years worth of symptoms; this may be a prolonged hospitalization (unclear DC date at this time)   Risk Assessment/Risk Scores:       [place]  Heart Association (NYHA) Functional Class NYHA Class IV       For questions or updates, please contact [facility] Please consult [email or web address] for contact info under   Gloriann Larger, MD FASE [facility] Cardiologist [facility]  [address] [place], [place] [address] [phone]  3:44 PM

## 2024-02-20 NOTE — Progress Notes (Signed)
 Occupational Therapy Treatment Patient Details Name: Alex [name] MRN: [id number] DOB: 1970-12-27 Today's Date: 02/20/2024   History of present illness Pt is a 53 y.o. male presenting 02/18/24 with BLE pitting edema, generalized weakness, and weeping wounds. PMH significant for HTN, CVA, HTN, MI, COPD, medication non-compliance.   OT comments  Pt progressing well towards goals. Reviewed adaptive equipment for LB dressing. Pt with good recall of sock aid, but needed demo for use of reacher. OT educated pt on where to purchase equipment. Will continue to practice to improve carryover. Continue to recommend HHOT to optimize independence levels. Will continue to follow acutely.      If plan is discharge home, recommend the following:  A little help with walking and/or transfers;A little help with bathing/dressing/bathroom;Assistance with cooking/housework;Help with stairs or ramp for entrance;Assist for transportation   Equipment Recommendations  Tub/shower bench    Recommendations for Other Services      Precautions / Restrictions Precautions Precautions: Fall Recall of Precautions/Restrictions: Intact Restrictions Weight Bearing Restrictions Per Provider Order: No       Mobility Bed Mobility Overal bed mobility: Needs Assistance Bed Mobility: Supine to Sit     Supine to sit: Supervision, HOB elevated     General bed mobility comments: S for safety, no use of rails    Transfers Overall transfer level: Needs assistance Equipment used: None Transfers: Sit to/from Stand Sit to Stand: Supervision           General transfer comment: sup/CGA for safety, good use of hands for rise and lower     Balance Overall balance assessment: Needs assistance Sitting-balance support: Feet supported Sitting balance-Leahy Scale: Good     Standing balance support: During functional activity, No upper extremity supported Standing balance-Leahy Scale: Fair                              ADL either performed or assessed with clinical judgement   ADL Overall ADL's : Needs assistance/impaired                     Lower Body Dressing: Set up;With adaptive equipment Lower Body Dressing Details (indicate cue type and reason): With use of reacher and sock aid, cues for use Toilet Transfer: Supervision/safety;Ambulation             General ADL Comments: Re-educated pt on use of AE, pt able to return demo with verbal cues intermittently    Extremity/Trunk Assessment Upper Extremity Assessment Upper Extremity Assessment: Overall WFL for tasks assessed   Lower Extremity Assessment Lower Extremity Assessment: Defer to PT evaluation        Vision   Vision Assessment?: No apparent visual deficits         Communication Communication Communication: No apparent difficulties   Cognition Arousal: Alert Behavior During Therapy: WFL for tasks assessed/performed Cognition: No apparent impairments     Following commands: Intact        Cueing   Cueing Techniques: Verbal cues        General Comments VSS on RA    Pertinent Vitals/ Pain       Pain Assessment Pain Assessment: Faces Faces Pain Scale: Hurts even more Pain Location: bil feet, calves Pain Descriptors / Indicators: Aching Pain Intervention(s): Limited activity within patient's tolerance   Frequency  Min 1X/week        Progress Toward Goals  OT Goals(current goals can now be found in the care  plan section)  Progress towards OT goals: Progressing toward goals  Acute Rehab OT Goals Patient Stated Goal: To go home OT Goal Formulation: With patient Time For Goal Achievement: 03/04/24 Potential to Achieve Goals: Good ADL Goals Pt Will Perform Lower Body Bathing: with modified independence;with adaptive equipment;sit to/from stand Pt Will Perform Lower Body Dressing: with modified independence;with adaptive equipment;sit to/from stand Pt Will Transfer to Toilet: with  modified independence;ambulating Pt Will Perform Tub/Shower Transfer: Tub transfer;tub bench;with supervision  Plan         AM-PAC OT 6 Clicks Daily Activity     Outcome Measure   Help from another person eating meals?: None Help from another person taking care of personal grooming?: A Little Help from another person toileting, which includes using toliet, bedpan, or urinal?: A Little Help from another person bathing (including washing, rinsing, drying)?: A Little Help from another person to put on and taking off regular upper body clothing?: A Little Help from another person to put on and taking off regular lower body clothing?: A Little 6 Click Score: 19    End of Session Equipment Utilized During Treatment: Gait belt;Other (comment) (reacher and sock aid)  OT Visit Diagnosis: Unsteadiness on feet (R26.81);Muscle weakness (generalized) (M62.81)   Activity Tolerance Patient tolerated treatment well   Patient Left in bed;with call bell/phone within reach   Nurse Communication Mobility status        Time: 1610-9604 OT Time Calculation (min): 14 min  Charges: OT General Charges $OT Visit: 1 Visit OT Treatments $Self Care/Home Management : 8-22 mins  [name], OT  Acute Rehabilitation Services Office [phone] Secure chat preferred   [name] 02/20/2024, 3:19 PM

## 2024-02-20 NOTE — Progress Notes (Signed)
 Mobility Specialist Progress Note;   02/20/24 1114  Mobility  Activity Ambulated with assistance in hallway  Level of Assistance Contact guard assist, steadying assist  Assistive Device None  Distance Ambulated (ft) 225 ft  Activity Response Tolerated well  Mobility Referral Yes  Mobility visit 1 Mobility  Mobility Specialist Start Time (ACUTE ONLY) 1114  Mobility Specialist Stop Time (ACUTE ONLY) 1121  Mobility Specialist Time Calculation (min) (ACUTE ONLY) 7 min   Pt agreeable to mobility, however often falling asleep while MS is talking to pt. Once up, pt more alert. Required MinG assistance during ambulation for safety. VSS throughout and no c/o when asked. Pt returned to sitting on EoB with all needs met, call bell in reach.   Janit Meline Mobility Specialist Please contact via SecureChat or [employer] [phone]

## 2024-02-20 NOTE — Progress Notes (Signed)
 2/2 vs 2/3 or 2/4   PHARMACY - PHYSICIAN COMMUNICATION CRITICAL VALUE ALERT - BLOOD CULTURE IDENTIFICATION (BCID)  Alex [name] is an 53 y.o. male who presented to [facility] on 02/18/2024 with a chief complaint of hypertension.  Assessment: 53 YOM on antibiotics for B/L LE cellulitis now with 1 set of blood cultures showing GPC in clusters with BCID detecting staph species - which could be seeded from his cellulitis or be contamination. It appears another set was also collected with no bottles showing positive yet.   Name of physician (or Provider) Contacted: Shahmehdi  Current antibiotics: Cefazolin  Changes to prescribed antibiotics recommended:  No changes for now - monitor additional set for updates  Results for orders placed or performed during the hospital encounter of 02/18/24  Blood Culture ID Panel (Reflexed) (Collected: 02/18/2024  8:53 PM)  Result Value Ref Range   Enterococcus faecalis NOT DETECTED NOT DETECTED   Enterococcus Faecium NOT DETECTED NOT DETECTED   Listeria monocytogenes NOT DETECTED NOT DETECTED   Staphylococcus species DETECTED (A) NOT DETECTED   Staphylococcus aureus (BCID) NOT DETECTED NOT DETECTED   Staphylococcus epidermidis NOT DETECTED NOT DETECTED   Staphylococcus lugdunensis NOT DETECTED NOT DETECTED   Streptococcus species NOT DETECTED NOT DETECTED   Streptococcus agalactiae NOT DETECTED NOT DETECTED   Streptococcus pneumoniae NOT DETECTED NOT DETECTED   Streptococcus pyogenes NOT DETECTED NOT DETECTED   A.calcoaceticus-baumannii NOT DETECTED NOT DETECTED   Bacteroides fragilis NOT DETECTED NOT DETECTED   Enterobacterales NOT DETECTED NOT DETECTED   Enterobacter cloacae complex NOT DETECTED NOT DETECTED   Escherichia coli NOT DETECTED NOT DETECTED   Klebsiella aerogenes NOT DETECTED NOT DETECTED   Klebsiella oxytoca NOT DETECTED NOT DETECTED   Klebsiella pneumoniae NOT DETECTED NOT DETECTED   Proteus species NOT DETECTED NOT DETECTED    Salmonella species NOT DETECTED NOT DETECTED   Serratia marcescens NOT DETECTED NOT DETECTED   Haemophilus influenzae NOT DETECTED NOT DETECTED   Neisseria meningitidis NOT DETECTED NOT DETECTED   Pseudomonas aeruginosa NOT DETECTED NOT DETECTED   Stenotrophomonas maltophilia NOT DETECTED NOT DETECTED   Candida albicans NOT DETECTED NOT DETECTED   Candida auris NOT DETECTED NOT DETECTED   Candida glabrata NOT DETECTED NOT DETECTED   Candida krusei NOT DETECTED NOT DETECTED   Candida parapsilosis NOT DETECTED NOT DETECTED   Candida tropicalis NOT DETECTED NOT DETECTED   Cryptococcus neoformans/gattii NOT DETECTED NOT DETECTED    Thank you for allowing pharmacy to be a part of this patient's care.  Garland Junk, PharmD, BCPS, BCIDP Infectious Diseases Clinical Pharmacist 02/20/2024 3:58 PM   **Pharmacist phone directory can now be found on [email or web address] ([email or web address]).  Listed under [facility].

## 2024-02-20 NOTE — TOC Initial Note (Signed)
 Transition of Care [facility]) - Initial/Assessment Note    Patient Details  Name: Alex [name] MRN: [id number] Date of Birth: Sep 12, 1970  Transition of Care [facility]) CM/SW Contact:    Cosimo Diones, RN Phone Number: 02/20/2024, 3:46 PM  Clinical Narrative: Patient presented for generalized weakness and complaints of leg swelling. Patient is being treated for cellulitis-IV Ancef. PTA patient states he was from home with two roommates. Patient states his roommate can learn how to do the dressing changes. Case Manager discussed home health with the patient and he is agreeable to home health-patient does not have an agency preference. Referral submitted to [facility]. Agency will need orders for authorization. Start of care to begin once authorization has been completed for the [place]. Patient states his PCP is at the [facility] and his meds are obtained via mail order. Patient states friends provide transportation to appointments. Case Manager educated patient regarding [facility] transportation for appointments. Patient will just have to call his CSW and the office will arrange transportation.  Case Manager will continue to follow for additional needs.           Expected Discharge Plan: Home w Home Health Services Barriers to Discharge: No Barriers Identified   Patient Goals and CMS Choice Patient states their goals for this hospitalization and ongoing recovery are:: patient will return home with roommates.   Choice offered to / list presented to : Patient (Patient did not have a preference-just to be in network with the [facility])      Expected Discharge Plan and Services   Discharge Planning Services: CM Consult Post Acute Care Choice: Home Health Living arrangements for the past 2 months: Single Family Home                           HH Arranged: RN, Disease Management, PT, OT HH Agency: [facility] Date [facility] Agency Contacted: 02/20/24 Time HH Agency  Contacted: 1545 Representative spoke with at [facility] Agency: Bartholomew Light  Prior Living Arrangements/Services Living arrangements for the past 2 months: Single Family Home Lives with:: Roommate   Do you feel safe going back to the place where you live?: Yes      Need for Family Participation in Patient Care: No (Comment) Care giver support system in place?: Yes (comment)   Criminal Activity/Legal Involvement Pertinent to Current Situation/Hospitalization: No - Comment as needed  Activities of Daily Living   ADL Screening (condition at time of admission) Independently performs ADLs?: Yes (appropriate for developmental age) Is the patient deaf or have difficulty hearing?: No Does the patient have difficulty seeing, even when wearing glasses/contacts?: No Does the patient have difficulty concentrating, remembering, or making decisions?: No  Permission Sought/Granted Permission sought to share information with : Family Supports, Magazine features editor, Case Estate manager/land agent granted to share information with : Yes, Verbal Permission Granted     Permission granted to share info w AGENCY: [facility]        Emotional Assessment Appearance:: Appears stated age Attitude/Demeanor/Rapport: Engaged Affect (typically observed): Appropriate Orientation: : Oriented to Self, Oriented to Place, Oriented to  Time Alcohol / Substance Use: Not Applicable Psych Involvement: No (comment)  Admission diagnosis:  Hypoxia [R09.02] General weakness [R53.1] Generalized weakness [R53.1] Hypertension, unspecified type [I10] Patient Active Problem List   Diagnosis Date Noted   Generalized weakness 02/19/2024   Acute respiratory failure with hypoxia (HCC) 02/19/2024   Leukocytosis 02/19/2024   Acute on chronic diastolic CHF (  congestive heart failure) (HCC) 02/19/2024   PSVT (paroxysmal supraventricular tachycardia) (HCC) 02/19/2024   Normocytic anemia 02/19/2024   Bilateral lower extremity edema  05/22/2023   Essential hypertension 05/22/2023   Cellulitis 05/21/2023   Secondary hypertension 05/21/2023   Morbid obesity (HCC) 05/21/2023   History of cerebrovascular accident (CVA) with residual deficit 12/12/2021   Tobacco dependence 12/12/2021   Alcohol use disorder 12/12/2021   Prediabetes 12/12/2021   Depression 12/12/2021   CVA (cerebral vascular accident) (HCC) 11/20/2021   Hypertensive urgency 01/01/2021   Tobacco abuse 01/01/2021   Alcohol use 01/01/2021   Substance use 01/01/2021   Vertigo 01/01/2021   Resistant hypertension 01/01/2021   PCP:  [facility] Pharm[facility]:   [facility] [phone] Jonette Nestle, [place] - [address] AT [facility] [address] [place] [address] Phone: [phone] Fax: [phone]  [place] [facility] [address], [address] [place] [address] Phone: [phone] Fax: [phone]  [facility] [address] [place] [address] Phone: [phone] Fax: [phone]  [facility] [address] [place] [address] Phone: [phone] Fax: [phone]  [facility] - [place], [place] - [address] [place] [address] Phone: [phone] Fax: [phone]     Social Drivers of Health (SDOH) Social History: SDOH Screenings   Food Insecurity: Food Insecurity Present (02/19/2024)  Housing: Unknown (02/19/2024)  Transportation Needs: No Transportation Needs (02/19/2024)  Utilities: Not At Risk (02/19/2024)  Depression (PHQ2-9): Medium Risk (02/10/2022)  Tobacco Use: High Risk (02/18/2024)   SDOH Interventions:     Readmission Risk Interventions    11/22/2021   11:28 AM  Readmission Risk Prevention Plan  Post Dischage Appt Not Complete  Appt Comments Patient needs to send financials to [facility] to  get PCP assigned, he is aware of this and verbalizes agreement to do so  Medication Screening Complete  Transportation Screening Complete

## 2024-02-20 NOTE — Progress Notes (Signed)
 PROGRESS NOTE    Patient: Alex [name]                            PCP: [facility]                    DOB: Oct 03, 1970            DOA: 02/18/2024 ZOX:[id number]             DOS: 02/20/2024, 11:39 AM   LOS: 1 day   Date of Service: The patient was seen and examined on 02/20/2024  Subjective:   The patient was seen and examined this morning. Hemodynamically stable. No issues overnight .  Brief Narrative:   Alex [name] is a 53 y.o. male with medical history significant of hypertension, CVA, COPD, tobacco abuse, and obesity who presented with complaints of leg swelling.   He has significant swelling and redness in his legs and feet. He denies chest pain but feels feverish at times, although he is uncertain about having an actual fever.   Does admit to being depressed but denies any suicidal ideations.   He smokes about a pack and a half of cigarettes per day and has not experienced any other pains aside from the leg swelling.   ED: Noted to be afebrile with tachypnea, blood pressures elevated up to 213/105, and O2 saturations as low as 84% with improvement on 2 L of nasal cannula oxygen.  Labs since 6/16 significant for WBC 10.8, hemoglobin 12.8, high-sensitivity troponin 17-> 20, D-dimer 3.25, alcohol level undetectable, and BNP 67.  Chest x-ray showed low lung volumes with bilateral bibasilar atelectasis.   Urinalysis showed no signs for infection,  Due to the elevated D-dimer a CT angiogram of the chest have been obtained which showed no evidence of a pulmonary embolism and moderate hepatic steatosis.   Blood cultures have been obtained.   Patient has been given an DuoNeb breathing treatment.   He was noted to have a 22nd episode of V. tach with conversion back into a normal sinus rhythm.      Assessment & Plan:   Principal Problem:   Generalized weakness Active Problems:   Acute respiratory failure with hypoxia (HCC)   Leukocytosis   Hypertensive urgency    Cellulitis   Bilateral lower extremity edema   Acute on chronic diastolic CHF (congestive heart failure) (HCC)   PSVT (paroxysmal supraventricular tachycardia) (HCC)   Normocytic anemia   Depression   Tobacco abuse     Assessment and Plan:   Acute respiratory failure with hypoxia Patient presents with acute respiratory failure with hypoxia.   POA: O2  84% on room air>>> 93%    Chest x-ray showed Low lung volumes with mild bibasilar atelectasis.   CT angiogram of the chest did not reveal any signs for pulmonary embolism or acute findings in the chest.  - Continuous pulse oximetry with oxygen maintain O2 saturation greater than 92%. - Viral respiratory panel all negative,  - Breathing treatments as needed   Hypertensive urgency On admission blood pressures elevated up to 213/105.   - BP improved with current meds  Patient had stopped taking all of his blood pressure medications  which previously included  Amlodipine  10 mg daily, hydralazine  50 mg 3 times daily, we will send refills- add  hydrochlorothiazide  25 mg daily - Resume amlodipine  and hydralazine  - Hydralazine  IV as needed   Bilateral lower extremity swelling Possible cellulitis  Acute.  Patient has at least 3+ pitting bilateral lower extremity edema with surrounding erythema.  ? acute infection.   Urinalysis did not show significant signs for proteinuria.  Cause of swelling  possibly diastolic CHF versus vascular insufficiency versus hypoalbuminemia. - Elevate lower extremities - Check CRP - Lasix  40 mg IV twice daily - Empiric antibiotics as cefazolin   Diastolic congestive heart failure Suspect acute on chronic.  Patient with at least 3+ pitting edema noted on the bilateral lower extremities.  BNP was 67.  Last echocardiogram noted EF to be 60 to 65% with grade 1 diastolic dysfunction when checked last in 2023.  Other causes for symptoms include hypoalbuminemia - Strict I&O's and daily weights - Echocardiogram -  Lasix  as noted above.  Reassess diuresis in a.m. adjust as needed   Leukocytosis Acute. WBC 10.8, but repeat check within normal limits at 10.1.  Thought possibly related to above. - CRP 3.7  -No signs of infection withholding antibiotic use at this point   PSVT Patient was noted to have a 22nd episode of SVT while in the emergency department that spontaneous converted back into a normal sinus rhythm. - Follow-up telemetry overnight -Monitoring electrolytes and replating   Normocytic anemia iron deficiency -Total iron 23, saturation ratio 8 -Initiating iron supplements Hemoglobin noted to be 12.8 elevated RDW of 15.9.    Generalized weakness Patient reports having issues with getting up and moving around due to lower extremity swelling. - PSA is normal at 1.03 - PT to evaluate and treat   Depression Patient denies any reports of wanting to hurt himself.   Tobacco abuse Patient reports smoking a pack and a half of cigarettes per day on average. - Continue patch offered    Assessment and Plan: No notes have been filed under this hospital service. Service: Hospitalist           ----------------------------------------------------------------------------------------------------------------------------------------------- Nutritional status:  The patient's BMI is: Body mass index is 39.66 kg/m. I agree with the assessment and plan as outlined  ------------------------------------------------------------------------------------------------------------------------------------------------  DVT prophylaxis:  SCDs Start: 02/19/24 0008   Code Status:   Code Status: Full Code  Family Communication: No family member present at bedside-  -Advance care planning has been discussed.   Admission status:   Status is: Inpatient Remains inpatient appropriate because: Needing further evaluation of acute respiratory failure, severe generalized weaknesses,   Disposition: From   - home             Planning for discharge in 1 days: to home with home health  Procedures:   No admission procedures for hospital encounter.   Antimicrobials:  Anti-infectives (From admission, onward)    Start     Dose/Rate Route Frequency Ordered Stop   02/19/24 1400  ceFAZolin (ANCEF) IVPB 2g/100 mL premix        2 g 200 mL/hr over 30 Minutes Intravenous Every 8 hours 02/19/24 1314 02/26/24 1359        Medication:   amLODipine   10 mg Oral Daily   furosemide   40 mg Intravenous BID   hydrALAZINE   50 mg Oral TID   nicotine   21 mg Transdermal Daily    acetaminophen  **OR** acetaminophen , albuterol, hydrALAZINE , melatonin, ondansetron  (ZOFRAN ) IV   Objective:   Vitals:   02/19/24 2014 02/19/24 2339 02/20/24 0445 02/20/24 0800  BP: 118/80 (!) 118/50 126/88 (!) 159/100  Pulse:  65 87   Resp: 19 18 19 20   Temp: 98.7 F (37.1 C) 98.5 F (36.9 C) 98.5  F (36.9 C) 97.7 F (36.5 C)  TempSrc: Oral Oral Oral Oral  SpO2: 99% (!) 87% 97% 93%  Weight:   129 kg   Height:        Intake/Output Summary (Last 24 hours) at 02/20/2024 1139 Last data filed at 02/20/2024 0446 Gross per 24 hour  Intake 240 ml  Output 1450 ml  Net -1210 ml   Filed Weights   02/18/24 1928 02/20/24 0445  Weight: 128.4 kg 129 kg     Physical examination:   Constitution:  Alert, cooperative, SOB,  Appears calm and comfortable  Psychiatric:   Normal and stable mood and affect, cognition intact,   HEENT:        Normocephalic, PERRL, otherwise with in Normal limits  Chest:         Chest symmetric Cardio vascular:  S1/S2, RRR, No murmure, No Rubs or Gallops  pulmonary: Clear to auscultation bilaterally, respirations unlabored, negative wheezes / crackles Abdomen: Soft, non-tender, non-distended, bowel sounds,no masses, no organomegaly Muscular skeletal: Limited exam - in bed, able to move all 4 extremities,   Neuro: CNII-XII intact. , normal motor and sensation, reflexes intact  Extremities: +2  pitting edema lower extremities, +2 pulses  Skin: Dry, warm to touch, negative for any Rashes, No open wounds Wounds: per nursing documentation   ------------------------------------------------------------------------------------------------------------------------------------------    LABs:     Latest Ref Rng & Units 02/19/2024    6:08 AM 02/18/2024    7:31 PM 05/22/2023    5:35 AM  CBC  WBC 4.0 - 10.5 K/uL 10.1  10.8  10.0   Hemoglobin 13.0 - 17.0 g/dL 16.1  09.6  04.5   Hematocrit 39.0 - 52.0 % 40.6  42.8  41.6   Platelets 150 - 400 K/uL 354  391  302       Latest Ref Rng & Units 02/20/2024    3:49 AM 02/19/2024    6:00 AM 02/18/2024    7:31 PM  CMP  Glucose 70 - 99 mg/dL 409  811  914   BUN 6 - 20 mg/dL 10  10  11    Creatinine 0.61 - 1.24 mg/dL 7.82  9.56  2.13   Sodium 135 - 145 mmol/L 135  136  136   Potassium 3.5 - 5.1 mmol/L 3.9  3.8  4.0   Chloride 98 - 111 mmol/L 100  101  100   CO2 22 - 32 mmol/L 28  28  27    Calcium  8.9 - 10.3 mg/dL 8.6  8.8  9.5   Total Protein 6.5 - 8.1 g/dL  7.4    Total Bilirubin 0.0 - 1.2 mg/dL  0.4    Alkaline Phos 38 - 126 U/L  57    AST 15 - 41 U/L  17    ALT 0 - 44 U/L  19         Micro Results Recent Results (from the past 240 hours)  Blood culture (routine x 2)     Status: None (Preliminary result)   Collection Time: 02/18/24  8:53 PM   Specimen: BLOOD  Result Value Ref Range Status   Specimen Description BLOOD SITE NOT SPECIFIED  Final   Special Requests   Final    BOTTLES DRAWN AEROBIC AND ANAEROBIC Blood Culture adequate volume   Culture   Final    NO GROWTH 2 DAYS Performed at [facility], [address], [place], [place] [address]    Report Status PENDING  Incomplete  Resp  panel by RT-PCR (RSV, Flu A&B, Covid) Anterior Nasal Swab     Status: None   Collection Time: 02/19/24  8:22 AM   Specimen: Anterior Nasal Swab  Result Value Ref Range Status   SARS Coronavirus 2 by RT PCR NEGATIVE NEGATIVE Final    Influenza A by PCR NEGATIVE NEGATIVE Final   Influenza B by PCR NEGATIVE NEGATIVE Final    Comment: (NOTE) The Xpert Xpress SARS-CoV-2/FLU/RSV plus assay is intended as an aid in the diagnosis of influenza from Nasopharyngeal swab specimens and should not be used as a sole basis for treatment. Nasal washings and aspirates are unacceptable for Xpert Xpress SARS-CoV-2/FLU/RSV testing.  Fact Sheet for Patients: BloggerCourse.com  Fact Sheet for Healthcare Providers: SeriousBroker.it  This test is not yet approved or cleared by the [place]  FDA and has been authorized for detection and/or diagnosis of SARS-CoV-2 by FDA under an Emergency Use Authorization (EUA). This EUA will remain in effect (meaning this test can be used) for the duration of the COVID-19 declaration under Section 564(b)(1) of the Act, 21 U.S.C. section 360bbb-3(b)(1), unless the authorization is terminated or revoked.     Resp Syncytial Virus by PCR NEGATIVE NEGATIVE Final    Comment: (NOTE) Fact Sheet for Patients: BloggerCourse.com  Fact Sheet for Healthcare Providers: SeriousBroker.it  This test is not yet approved or cleared by the [place]  FDA and has been authorized for detection and/or diagnosis of SARS-CoV-2 by FDA under an Emergency Use Authorization (EUA). This EUA will remain in effect (meaning this test can be used) for the duration of the COVID-19 declaration under Section 564(b)(1) of the Act, 21 U.S.C. section 360bbb-3(b)(1), unless the authorization is terminated or revoked.  Performed at [facility], [address] [place], [place] [address]     Radiology Reports No results found.  SIGNED: Bobbetta Burnet, MD, FHM. FAAFP. Arlin Benes - [facility] hospital[facility] Time spent - 55 min.  In seeing, evaluating and examining the patient. Reviewing medical records, labs, drawn plan of  care. [facility],  Pager (please use [email or web address] to page/ text) Please use Epic Secure Chat for non-urgent communication (7AM-7PM)  If 7PM-7AM, please contact night-coverage [email or web address], 02/20/2024, 11:39 AM

## 2024-02-20 NOTE — Hospital Course (Addendum)
 Alex [name] is a 53 y.o. male with medical history significant of hypertension, CVA, COPD, tobacco abuse, and obesity who presented with complaints of leg swelling.   He has significant swelling and redness in his legs and feet. He denies chest pain but feels feverish at times, although he is uncertain about having an actual fever.   Does admit to being depressed but denies any suicidal ideations.   He smokes about a pack and a half of cigarettes per day and has not experienced any other pains aside from the leg swelling.   ED: Noted to be afebrile with tachypnea, blood pressures elevated up to 213/105, and O2 saturations as low as 84% with improvement on 2 L of nasal cannula oxygen.  Labs since 6/16 significant for WBC 10.8, hemoglobin 12.8, high-sensitivity troponin 17-> 20, D-dimer 3.25, alcohol level undetectable, and BNP 67.  Chest x-ray showed low lung volumes with bilateral bibasilar atelectasis.   Urinalysis showed no signs for infection,  Due to the elevated D-dimer a CT angiogram of the chest have been obtained which showed no evidence of a pulmonary embolism and moderate hepatic steatosis.   Blood cultures have been obtained.   Patient has been given an DuoNeb breathing treatment.   He was noted to have a 22nd episode of V. tach with conversion back into a normal sinus rhythm.      Assessment & Plan:   Principal Problem:   Generalized weakness Active Problems:   Acute respiratory failure with hypoxia (HCC)   Leukocytosis   Hypertensive urgency   Cellulitis   Bilateral lower extremity edema   Acute on chronic diastolic CHF (congestive heart failure) (HCC)   PSVT (paroxysmal supraventricular tachycardia) (HCC)   Normocytic anemia   Depression   Tobacco abuse     Assessment and Plan:   Acute respiratory failure with hypoxia Patient presents with acute respiratory failure with hypoxia.   POA: O2  84% on room air>>> 93%    Chest x-ray showed Low lung volumes with  mild bibasilar atelectasis.   CT angiogram of the chest did not reveal any signs for pulmonary embolism or acute findings in the chest.  - Continuous pulse oximetry with oxygen maintain O2 saturation greater than 92%. - Viral respiratory panel all negative,  - Breathing treatments as needed   Hypertensive urgency On admission blood pressures elevated up to 213/105.   - BP improved with current meds  Patient had stopped taking all of his blood pressure medications  which previously included  Amlodipine  10 mg daily, hydralazine  50 mg 3 times daily, we will send refills- add  hydrochlorothiazide  25 mg daily - Resume amlodipine  and hydralazine  - Hydralazine  IV as needed   Bilateral lower extremity swelling Possible cellulitis Acute.  Patient has at least 3+ pitting bilateral lower extremity edema with surrounding erythema.  ? acute infection.   Urinalysis did not show significant signs for proteinuria.  Cause of swelling  possibly diastolic CHF versus vascular insufficiency versus hypoalbuminemia. - Elevate lower extremities - Check CRP - Lasix  40 mg IV twice daily - Empiric antibiotics as cefazolin   Diastolic congestive heart failure Suspect acute on chronic.  Patient with at least 3+ pitting edema noted on the bilateral lower extremities.  BNP was 67.  Last echocardiogram noted EF to be 60 to 65% with grade 1 diastolic dysfunction when checked last in 2023.  Other causes for symptoms include hypoalbuminemia - Strict I&O's and daily weights - Echocardiogram - Lasix  as noted above.  Reassess  diuresis in a.m. adjust as needed   Leukocytosis Acute. WBC 10.8, but repeat check within normal limits at 10.1.  Thought possibly related to above. - CRP 3.7  -No signs of infection withholding antibiotic use at this point   PSVT Patient was noted to have a 22nd episode of SVT while in the emergency department that spontaneous converted back into a normal sinus rhythm. - Follow-up telemetry  overnight -Monitoring electrolytes and replating   Normocytic anemia iron deficiency -Total iron 23, saturation ratio 8 -Initiating iron supplements Hemoglobin noted to be 12.8 elevated RDW of 15.9.    Generalized weakness Patient reports having issues with getting up and moving around due to lower extremity swelling. - PSA is normal at 1.03 - PT to evaluate and treat   Depression Patient denies any reports of wanting to hurt himself.   Tobacco abuse Patient reports smoking a pack and a half of cigarettes per day on average. - Continue patch offered

## 2024-02-20 NOTE — TOC CM/SW Note (Signed)
 Notified April with the [facility] of admission.  Patient is followed by Dr. Ressie Cassette and CSW [place] [phone] ext [phone]

## 2024-02-21 ENCOUNTER — Other Ambulatory Visit (HOSPITAL_COMMUNITY)

## 2024-02-21 LAB — LIPID PANEL
Cholesterol: 145 mg/dL (ref 0–200)
HDL: 30 mg/dL — ABNORMAL LOW (ref >40)
LDL Cholesterol: 98 mg/dL (ref 0–99)
Total CHOL/HDL Ratio: 4.8 ratio
Triglycerides: 84 mg/dL (ref <150)
VLDL: 17 mg/dL (ref 0–40)

## 2024-02-21 LAB — CBC WITH DIFFERENTIAL/PLATELET
Abs Immature Granulocytes: 0.04 10*3/uL (ref 0.00–0.07)
Basophils Absolute: 0 10*3/uL (ref 0.0–0.1)
Basophils Relative: 1 %
Eosinophils Absolute: 0.2 10*3/uL (ref 0.0–0.5)
Eosinophils Relative: 2 %
HCT: 43.2 % (ref 39.0–52.0)
Hemoglobin: 13.8 g/dL (ref 13.0–17.0)
Immature Granulocytes: 1 %
Lymphocytes Relative: 20 %
Lymphs Abs: 1.7 10*3/uL (ref 0.7–4.0)
MCH: 26.2 pg (ref 26.0–34.0)
MCHC: 31.9 g/dL (ref 30.0–36.0)
MCV: 82.1 fL (ref 80.0–100.0)
Monocytes Absolute: 0.7 10*3/uL (ref 0.1–1.0)
Monocytes Relative: 8 %
Neutro Abs: 5.9 10*3/uL (ref 1.7–7.7)
Neutrophils Relative %: 68 %
Platelets: 334 10*3/uL (ref 150–400)
RBC: 5.26 MIL/uL (ref 4.22–5.81)
RDW: 15.9 % — ABNORMAL HIGH (ref 11.5–15.5)
WBC: 8.6 10*3/uL (ref 4.0–10.5)
nRBC: 0 % (ref 0.0–0.2)

## 2024-02-21 LAB — COMPREHENSIVE METABOLIC PANEL WITH GFR
ALT: 14 U/L (ref 0–44)
AST: 17 U/L (ref 15–41)
Albumin: 2.7 g/dL — ABNORMAL LOW (ref 3.5–5.0)
Alkaline Phosphatase: 56 U/L (ref 38–126)
Anion gap: 12 (ref 5–15)
BUN: 9 mg/dL (ref 6–20)
CO2: 26 mmol/L (ref 22–32)
Calcium: 8.9 mg/dL (ref 8.9–10.3)
Chloride: 98 mmol/L (ref 98–111)
Creatinine, Ser: 0.87 mg/dL (ref 0.61–1.24)
GFR, Estimated: >60 mL/min (ref >60)
Glucose, Bld: 96 mg/dL (ref 70–99)
Potassium: 4.4 mmol/L (ref 3.5–5.1)
Sodium: 136 mmol/L (ref 135–145)
Total Bilirubin: 0.6 mg/dL (ref 0.0–1.2)
Total Protein: 8.1 g/dL (ref 6.5–8.1)

## 2024-02-21 MED ORDER — SPIRONOLACTONE 25 MG PO TABS
25.0000 mg | ORAL_TABLET | Freq: Every day | ORAL | Status: DC
  Administered 2024-02-22 – 2024-02-25 (×4): 25 mg via ORAL
  Filled 2024-02-21 (×5): qty 1

## 2024-02-21 MED ORDER — SACUBITRIL-VALSARTAN 97-103 MG PO TABS
1.0000 | ORAL_TABLET | Freq: Two times a day (BID) | ORAL | Status: DC
  Administered 2024-02-22 – 2024-02-25 (×7): 1 via ORAL
  Filled 2024-02-21 (×8): qty 1

## 2024-02-21 MED ORDER — HYDRALAZINE HCL 50 MG PO TABS
100.0000 mg | ORAL_TABLET | Freq: Three times a day (TID) | ORAL | Status: DC
  Administered 2024-02-22 – 2024-02-25 (×10): 100 mg via ORAL
  Filled 2024-02-21 (×13): qty 2

## 2024-02-21 MED ORDER — ROSUVASTATIN CALCIUM 20 MG PO TABS
20.0000 mg | ORAL_TABLET | Freq: Every day | ORAL | Status: DC
  Administered 2024-02-22 – 2024-02-24 (×3): 20 mg via ORAL
  Filled 2024-02-21 (×4): qty 1

## 2024-02-21 MED ORDER — FUROSEMIDE 10 MG/ML IJ SOLN
80.0000 mg | Freq: Two times a day (BID) | INTRAMUSCULAR | Status: DC
  Administered 2024-02-22 – 2024-02-25 (×7): 80 mg via INTRAVENOUS
  Filled 2024-02-21 (×8): qty 8

## 2024-02-21 NOTE — Plan of Care (Signed)

## 2024-02-21 NOTE — Progress Notes (Addendum)
 Progress Note  Patient Name: Alex [name] Date of Encounter: 02/21/2024 [place] [name] Cardiologist: Alex Melody, MD   Interval Summary   Patient feeling well  BP 165/89 when I was in the room, has not gotten morning medications yet Pending echo and AM labs  Feels well, had episode of apnea, hypoxia, bradycardia last night while asleep as discussed below   Vital Signs Vitals:   02/21/24 0631 02/21/24 0636 02/21/24 0700 02/21/24 0802  BP: (!) 170/82  (!) 172/98 (!) 165/97  Pulse:   63 98  Resp: 16  16 16   Temp:   97.7 F (36.5 C) 97.7 F (36.5 C)  TempSrc:   Oral Oral  SpO2: 94% 97% 96% 100%  Weight:      Height:        Intake/Output Summary (Last 24 hours) at 02/21/2024 0806 Last data filed at 02/21/2024 0602 Gross per 24 hour  Intake 200 ml  Output 1900 ml  Net -1700 ml      02/21/2024    4:16 AM 02/20/2024    4:45 AM 02/18/2024    7:28 PM  Last 3 Weights  Weight (lbs) 279 lb 11.2 oz 284 lb 6.3 oz 283 lb 1.1 oz  Weight (kg) 126.871 kg 129 kg 128.4 kg      Telemetry/ECG  Sinus rhythm, HR 70s -- dropped to 30-40s overnight with hypoxia, discussed below - Personally Reviewed  Physical Exam  GEN: No acute distress.   Neck: mild JVD Cardiac: RRR, distant heart sounds  Respiratory: Clear to auscultation bilaterally. GI: Soft, nontender, non-distended  MS: LE bilateral edema, wrapped for cellulitis   Assessment & Plan  Alex [name] s a 53 year old with hypertension, prior stroke, and COPD who presents with leg swelling   Hypertensive emergency Heart failure, unknown etiology, duration, EF, NYHA IV Presented with HTN emergency, BP as high as 213/105, previously requiring oxygen Net -3.5 L this admission with almost 2 L out yesterday Pending AM labs Weight down to 279 lb today from 284 Currently on amlodipine  10 mg daily, IV Lasix  40 mg BID, hydralazine  50 mg TID, Entresto 41-59 mg BID Pending updated echocardiogram  Continue strict I&O's,  daily weights, BMPs  CAD  Coronary calcifications noted in LAD, LCX, aorta on CTPE 02/19/2024: ALT 19 02/21/2024: HDL 30; LDL Cholesterol 98  Continue ASA 81 mg daily   Suspected sleep apnea Overnight patient has episodes of apnea folloowed by bradycardia and hypoxia HR dropped into 30-40s, SpO2 dropped to 83%  Patient was asleep and denied any symptoms once awake  He was placed on 2 L [place] while resting Will need sleep study and likely require CPAP   Per primary COPD Tobacco use disorder Cellulitis Morbid obesity Malnutrition     For questions or updates, please contact Alex [name] Please consult [email or web address] for contact info under       Signed, Alex Mote, PA-C   I have personally seen and examined the patient.  My HPI, Exam, and assessment and plan are below, independent of the NPP above.   Patient notes no CP or palpitations.  SOB and improved: he is able to lay flat without coughing now  Exam notable for  Gen: no distress, morbid obesity   Neck: supine JVD Cardiac: No Rubs or Gallops, S3, regular rhythm Respiratory: Basilar brackles bilaterally, normal effort, normal  respiratory rate GI: Soft, nontender, distended  MS: No edema with UNNA boot; + moves all extremities Integument: Skin feels warm Neuro:  At time of evaluation, alert and oriented to person/place/time/situation  Psych: Normal affect, patient feels fair  Tele: SR  In assessment and plan:   Hypertensive emergency Heart failure of unclear etiology or duration - NYHA IV, Hypervolemic - Echo is pending - Entresto increased dose - Continue hydralazine  50 mg TID until echo - Increase lasix  dose added spironolactone - UNNA boot    Coronary artery disease Coronary artery calcifications in LAD, circumflex, and aortic with concurrent aortic atherosclerosis seen on CTPE. Aspirin  81 mg ordered. Fasting lipid panel ordered. Echocardiogram pending to assess heart function. Discussed the  significance of coronary artery disease and the role of aspirin  in reducing the risk of cardiovascular events. - Order aspirin  81 mg - LDL slightly above goal (HLD)- restarted rosuvastatin  20 mg - Await echocardiogram results  Morbid obesity STOP BANG 5 with daytime somnolence - outpatient sleep study   Malnutrition - TC consult ongoing; he needed reminded today that [facility]; needs aggressive social work support or high risk of readmission - reviewed [facility] support including transport and medication therapy  Chronic obstructive pulmonary disease (COPD) Tobacco use disorder- started age ~53 - contemplative of smoking cessation; his roommate smokes and he cannot leave that given housing issues - rest as per primary   Cellulitis - as per primary   Alex Larger, MD FASE [facility] Cardiologist [facility]  [address], [address] [place], [place] [address] [phone]  10:00 AM

## 2024-02-21 NOTE — Progress Notes (Signed)
 Physical Therapy Treatment Patient Details Name: Alex [name] MRN: [id number] DOB: 1971-06-09 Today's Date: 02/21/2024   History of Present Illness Pt is a 53 y.o. male presenting 02/18/24 with BLE pitting edema, generalized weakness, and weeping wounds. PMH significant for HTN, CVA, HTN, MI, COPD, medication non-compliance.    PT Comments  Pt greeted supine in bed, pleasant and agreeable to PT session. He advance his gait distance ambulating ~252ft without an AD and supervision for safety. Pt engaged in stair training according to his home set-up. He ascended/descended 8 steps three times with unilateral handrail and supervision for safety. Pt took a seated rest following stairs and before ambulating back to his room. Will continue to follow acutely and advance appropriately.    If plan is discharge home, recommend the following: Help with stairs or ramp for entrance;Assist for transportation;Direct supervision/assist for medications management;A little help with bathing/dressing/bathroom   Can travel by private vehicle        Equipment Recommendations  Other (comment) (tub bench)    Recommendations for Other Services       Precautions / Restrictions Precautions Precautions: Fall Recall of Precautions/Restrictions: Intact Restrictions Weight Bearing Restrictions Per Provider Order: No     Mobility  Bed Mobility Overal bed mobility: Modified Independent             General bed mobility comments: Pt performed supine<>sit from a flat bed without use of railings.    Transfers Overall transfer level: Needs assistance Equipment used: None Transfers: Sit to/from Stand, Bed to chair/wheelchair/BSC Sit to Stand: Supervision   Step pivot transfers: Supervision       General transfer comment: Pt stood from lowest bed height and recliner chair by pushing up with BUE support. Supervision for safety. Good eccentric control with sitting.    Ambulation/Gait Ambulation/Gait  assistance: Supervision Gait Distance (Feet): 250 Feet Assistive device: None Gait Pattern/deviations: Step-through pattern, Decreased stride length, Wide base of support Gait velocity: decr Gait velocity interpretation: 1.31 - 2.62 ft/sec, indicative of limited community ambulator   General Gait Details: Pt ambulated within the hallway well. He maintained an upright posture, even weight shift, and good foot clearence. No overt LOB, but noted increased trunk sway as pt fatigues. Distance self-limited by pt as he had recently walked with the mobility specialist.   Stairs Stairs: Yes Stairs assistance: Supervision Stair Management: One rail Left, Forwards, Alternating pattern Number of Stairs: 8 (x3) General stair comments: Pt ascended/descended a flight of stairs three times with varying UE support. Pt initially utilized B railings, but his home set-up only has one on the left. He completed the other two reps with unilateral UE support. Pt took a seated rest following stairs.   Wheelchair Mobility     Tilt Bed    Modified Rankin (Stroke Patients Only)       Balance Overall balance assessment: Mild deficits observed, not formally tested                                          Communication Communication Communication: No apparent difficulties  Cognition Arousal: Alert Behavior During Therapy: WFL for tasks assessed/performed   PT - Cognitive impairments: No apparent impairments                         Following commands: Intact      Cueing Cueing Techniques: Verbal cues  Exercises      General Comments General comments (skin integrity, edema, etc.): VSS on RA      Pertinent Vitals/Pain Pain Assessment Pain Assessment: Faces Faces Pain Scale: Hurts little more Pain Location: BLE Pain Descriptors / Indicators: Aching, Discomfort Pain Intervention(s): Monitored during session    Home Living                          Prior  Function            PT Goals (current goals can now be found in the care plan section) Acute Rehab PT Goals Patient Stated Goal: Return Home Progress towards PT goals: Progressing toward goals    Frequency    Min 1X/week      PT Plan      Co-evaluation              AM-PAC PT 6 Clicks Mobility   Outcome Measure  Help needed turning from your back to your side while in a flat bed without using bedrails?: None Help needed moving from lying on your back to sitting on the side of a flat bed without using bedrails?: None Help needed moving to and from a bed to a chair (including a wheelchair)?: A Little Help needed standing up from a chair using your arms (e.g., wheelchair or bedside chair)?: A Little Help needed to walk in hospital room?: A Little Help needed climbing 3-5 steps with a railing? : A Little 6 Click Score: 20    End of Session Equipment Utilized During Treatment: Gait belt Activity Tolerance: Patient tolerated treatment well Patient left: in bed;with call bell/phone within reach Nurse Communication: Mobility status PT Visit Diagnosis: Other abnormalities of gait and mobility (R26.89);Muscle weakness (generalized) (M62.81);Difficulty in walking, not elsewhere classified (R26.2);Pain Pain - part of body: Leg     Time: 1610-9604 PT Time Calculation (min) (ACUTE ONLY): 20 min  Charges:    $Gait Training: 8-22 mins PT General Charges $$ ACUTE PT VISIT: 1 Visit                     Glenford Lanes, PT, DPT Acute Rehabilitation Services Office: [phone] Secure Chat Preferred  Riva Chester 02/21/2024, 12:14 PM

## 2024-02-21 NOTE — Progress Notes (Signed)
 Report of bradycardia from monitor tech.  Upon assessment of patient he was found sleeping and easily aroused to be oriented x4.  He continued to have frequent but brief episodes of bradycardia reaching  low into the 30's which appeared to correlated with notable sleep apnea with hypoxia noted by oxygen saturation of 83% with apneic event.  He is asymptomatic and denies complaint.  I notified rapid response RN for assistance in assessing patient see her note for her assessment and resolution.  Patient placed on oxygen at 2L [place] and an EKG was performed.  Dr. Dezii notified and no new orders were given at this time.  Patient has not had further episodes bradycardia or hypoxic episodes since being placed on oxygen.  Patient lying in bed sleeping and easily aroused to be oriented x4.  He continues to deny complaint and no distress is noted at this time.

## 2024-02-21 NOTE — Progress Notes (Signed)
 PROGRESS NOTE    Patient: Alex [name]                            PCP: [facility]                    DOB: [birth year]            DOA: 02/18/2024 [id number]             DOS: 02/21/2024, 12:29 PM   LOS: 2 days   Date of Service: The patient was seen and examined on 02/21/2024  Subjective:   The patient was seen and examined this morning, stable no acute distress Reporting improved lower extremity edema erythema Satting 96 on room air, elevated blood pressure 155/131 No issues overnight  Brief Narrative:   Alex [name] is a 53 y.o. male with medical history significant of hypertension, CVA, COPD, tobacco abuse, and obesity who presented with complaints of leg swelling.   He has significant swelling and redness in his legs and feet. He denies chest pain but feels feverish at times, although he is uncertain about having an actual fever.   Does admit to being depressed but denies any suicidal ideations.   He smokes about a pack and a half of cigarettes per day and has not experienced any other pains aside from the leg swelling.   ED: Noted to be afebrile with tachypnea, blood pressures elevated up to 213/105, and O2 saturations as low as 84% with improvement on 2 L of nasal cannula oxygen.  Labs since 6/16 significant for WBC 10.8, hemoglobin 12.8, high-sensitivity troponin 17-> 20, D-dimer 3.25, alcohol level undetectable, and BNP 67.  Chest x-ray showed low lung volumes with bilateral bibasilar atelectasis.   Urinalysis showed no signs for infection,  Due to the elevated D-dimer a CT angiogram of the chest have been obtained which showed no evidence of a pulmonary embolism and moderate hepatic steatosis.   Blood cultures have been obtained.   Patient has been given an DuoNeb breathing treatment.   He was noted to have a 22nd episode of V. tach with conversion back into a normal sinus rhythm.      Assessment & Plan:   Principal Problem:   Generalized  weakness Active Problems:   Acute respiratory failure with hypoxia (HCC)   Leukocytosis   Hypertensive urgency   Cellulitis   Bilateral lower extremity edema   Acute on chronic diastolic CHF (congestive heart failure) (HCC)   PSVT (paroxysmal supraventricular tachycardia) (HCC)   Normocytic anemia   Depression   Tobacco abuse     Assessment and Plan:   Acute respiratory failure with hypoxia COPD exacerbation -  versus CHF exacerbation - Shortness of breath with exertion, denies having any chest pain Patient presents with acute respiratory failure with hypoxia.   POA: O2  84% on room air>>> 93% >> 96% on room air    Chest x-ray showed Low lung volumes with mild bibasilar atelectasis.   CT angiogram of the chest did not reveal any signs for pulmonary embolism or acute findings in the chest..  Chronic continuing COPD  -Monitor closely - Viral respiratory panel all negative,  - Breathing treatments as needed -Continue nebs, diuretics   Hypertensive urgency On admission blood pressures elevated up to 213/105.   - BP improved with current meds  Patient had stopped taking all of his blood pressure medications  which previously included  Resuming home  meds:  Amlodipine  10 mg daily,  hydralazine  50 mg 3 times daily,  Added   hydrochlorothiazide  25 mg daily  - Hydralazine  IV as needed   Bilateral lower extremity swelling Possible cellulitis -BMP 62.7, CRP 3.7, procalcitonin < 0.10 Acute.  Patient has at least 3+ pitting bilateral lower extremity edema with surrounding erythema.  ? acute infection.   Urinalysis did not show significant signs for proteinuria.  Cause of swelling  possibly diastolic CHF versus vascular insufficiency versus hypoalbuminemia. - Elevate lower extremities - Lasix  40 >> 80 mg IV twice daily - Empiric antibiotics as cefazolin   Diastolic congestive heart failure Suspect acute on chronic.  Patient with at least 3+ pitting edema noted on the  bilateral lower extremities.   BNP was 67.   Last echocardiogram noted EF to be 60 to 65% with grade 1 diastolic dysfunction when checked last in 2023.   Also  hypoalbuminemia - Strict I&O's and daily weights - Echocardiogram:  - Lasix  as noted above.  Reassess diuresis in a.m. adjust as needed    Intake/Output Summary (Last 24 hours) at 02/21/2024 1227 Last data filed at 02/21/2024 0847 Gross per 24 hour  Intake 440 ml  Output 2500 ml  Net -2060 ml     Leukocytosis Acute. WBC 10.8, but repeat check within normal limits at 10.1.  Thought possibly related to above. - CRP 3.7  -No signs of infection withholding antibiotic use at this point   PSVT Patient was noted to have a 22nd episode of SVT while in the emergency department that spontaneous converted back into a normal sinus rhythm. - Follow-up telemetry overnight -Monitoring electrolytes and replating   Normocytic anemia iron deficiency -Total iron 23, saturation ratio 8 -Initiating iron supplements Hemoglobin noted to be 12.8 elevated RDW of 15.9.    Generalized weakness Patient reports having issues with getting up and moving around due to lower extremity swelling. - PSA is normal at 1.03 - PT to evaluate and treat   Depression Patient denies any reports of wanting to hurt himself.   Tobacco abuse Patient reports smoking a pack and a half of cigarettes per day on average. - Continue patch offered    Assessment and Plan: No notes have been filed under this hospital service. Service: Hospitalist  ---------------------------------------------------------------------------------------------------------------- Nutritional status:  The patient's BMI is: Body mass index is 39.01 kg/m. I agree with the assessment and plan as outlined  ------------------------------------------------------------------------------------------------------------------------------------------------  DVT prophylaxis:  enoxaparin   (LOVENOX ) injection 40 mg Start: 02/20/24 2200 SCDs Start: 02/19/24 0008   Code Status:   Code Status: Full Code  Family Communication: No family member present at bedside-  -Advance care planning has been discussed.   Admission status:   Status is: Inpatient Remains inpatient appropriate because: Needing further evaluation of acute respiratory failure, severe generalized weaknesses,   Disposition: From  - home             Planning for discharge in 1 days: to home with home health  Procedures:   No admission procedures for hospital encounter.   Antimicrobials:  Anti-infectives (From admission, onward)    Start     Dose/Rate Route Frequency Ordered Stop   02/19/24 1400  ceFAZolin (ANCEF) IVPB 2g/100 mL premix        2 g 200 mL/hr over 30 Minutes Intravenous Every 8 hours 02/19/24 1314 02/26/24 1359        Medication:   amLODipine   10 mg Oral Daily   aspirin  EC  81 mg  Oral Daily   docusate sodium  200 mg Oral Daily   enoxaparin  (LOVENOX ) injection  40 mg Subcutaneous QHS   ferrous sulfate  325 mg Oral BID WC   furosemide   80 mg Intravenous BID   hydrALAZINE   100 mg Oral TID   nicotine   21 mg Transdermal Daily   rosuvastatin   20 mg Oral QHS   sacubitril-valsartan  1 tablet Oral BID   spironolactone  25 mg Oral Daily    acetaminophen  **OR** acetaminophen , albuterol, hydrALAZINE , melatonin, ondansetron  (ZOFRAN ) IV   Objective:   Vitals:   02/21/24 0802 02/21/24 0900 02/21/24 1000 02/21/24 1134  BP: (!) 165/97 (!) 135/90 136/82 (!) 155/131  Pulse: 98   93  Resp: 16   18  Temp: 97.7 F (36.5 C)   99.3 F (37.4 C)  TempSrc: Oral   Oral  SpO2: 100% 96% 96% 96%  Weight:      Height:        Intake/Output Summary (Last 24 hours) at 02/21/2024 1229 Last data filed at 02/21/2024 0847 Gross per 24 hour  Intake 440 ml  Output 2500 ml  Net -2060 ml   Filed Weights   02/18/24 1928 02/20/24 0445 02/21/24 0416  Weight: 128.4 kg 129 kg 126.9 kg     Physical  examination:        General:  AAO x 3,  cooperative, no distress;   HEENT:  Normocephalic, PERRL, otherwise with in Normal limits   Neuro:  CNII-XII intact. , normal motor and sensation, reflexes intact   Lungs:   Clear to auscultation BL, Respirations unlabored,  No wheezes / crackles  Cardio:    S1/S2, RRR, No murmure, No Rubs or Gallops   Abdomen:  Soft, non-tender, bowel sounds active all four quadrants, no guarding or peritoneal signs.  Muscular  skeletal:  Limited exam -global generalized weaknesses - in bed, able to move all 4 extremities,   2+ pulses,  symmetric, No pitting edema  Skin:  Dry, warm to touch, negative for any Rashes, Lower extremity erythema edema  Wounds: Bilateral lower extremity erythema-improved edema, Unna boots legs Superficial blisters, negative for any open wounds or drainage       Media Information   Media Information  Document Information  Photos  Right anterior leg  02/19/2024 18:10  Attached To:  Hospital Encounter on 02/18/24  Source Information        LABs:     Latest Ref Rng & Units 02/21/2024    8:18 AM 02/19/2024    6:08 AM 02/18/2024    7:31 PM  CBC  WBC 4.0 - 10.5 K/uL 8.6  10.1  10.8   Hemoglobin 13.0 - 17.0 g/dL 16.1  09.6  04.5   Hematocrit 39.0 - 52.0 % 43.2  40.6  42.8   Platelets 150 - 400 K/uL 334  354  391       Latest Ref Rng & Units 02/21/2024    8:18 AM 02/20/2024    3:49 AM 02/19/2024    6:00 AM  CMP  Glucose 70 - 99 mg/dL 96  409  811   BUN 6 - 20 mg/dL 9  10  10    Creatinine 0.61 - 1.24 mg/dL 9.14  7.82  9.56   Sodium 135 - 145 mmol/L 136  135  136   Potassium 3.5 - 5.1 mmol/L 4.4  3.9  3.8   Chloride 98 - 111 mmol/L 98  100  101   CO2 22 - 32 mmol/L  26  28  28    Calcium  8.9 - 10.3 mg/dL 8.9  8.6  8.8   Total Protein 6.5 - 8.1 g/dL 8.1   7.4   Total Bilirubin 0.0 - 1.2 mg/dL 0.6   0.4   Alkaline Phos 38 - 126 U/L 56   57   AST 15 - 41 U/L 17   17   ALT 0 - 44 U/L 14   19        Micro  Results Recent Results (from the past 240 hours)  Blood culture (routine x 2)     Status: Abnormal (Preliminary result)   Collection Time: 02/18/24  8:53 PM   Specimen: BLOOD  Result Value Ref Range Status   Specimen Description BLOOD SITE NOT SPECIFIED  Final   Special Requests   Final    BOTTLES DRAWN AEROBIC AND ANAEROBIC Blood Culture adequate volume   Culture  Setup Time   Final    GRAM POSITIVE COCCI IN CLUSTERS IN BOTH AEROBIC AND ANAEROBIC BOTTLES CRITICAL RESULT CALLED TO, READ BACK BY AND VERIFIED WITH: PHARMD ELIZABETH MARTIN ON 02/20/24 @ 1524 BY DRT    Culture (A)  Final    STAPHYLOCOCCUS COHNII THE SIGNIFICANCE OF ISOLATING THIS ORGANISM FROM A SINGLE SET OF BLOOD CULTURES WHEN MULTIPLE SETS ARE DRAWN IS UNCERTAIN. PLEASE NOTIFY THE MICROBIOLOGY DEPARTMENT WITHIN ONE WEEK IF SPECIATION AND SENSITIVITIES ARE REQUIRED. Performed at [facility], [address], [place], [place] [address]    Report Status PENDING  Incomplete  Blood Culture ID Panel (Reflexed)     Status: Abnormal   Collection Time: 02/18/24  8:53 PM  Result Value Ref Range Status   Enterococcus faecalis NOT DETECTED NOT DETECTED Final   Enterococcus Faecium NOT DETECTED NOT DETECTED Final   Listeria monocytogenes NOT DETECTED NOT DETECTED Final   Staphylococcus species DETECTED (A) NOT DETECTED Final    Comment: CRITICAL RESULT CALLED TO, READ BACK BY AND VERIFIED WITH: PHARMD ELIZABETH MARTIN ON 02/20/24 @ 1524 BY DRT    Staphylococcus aureus (BCID) NOT DETECTED NOT DETECTED Final   Staphylococcus epidermidis NOT DETECTED NOT DETECTED Final   Staphylococcus lugdunensis NOT DETECTED NOT DETECTED Final   Streptococcus species NOT DETECTED NOT DETECTED Final   Streptococcus agalactiae NOT DETECTED NOT DETECTED Final   Streptococcus pneumoniae NOT DETECTED NOT DETECTED Final   Streptococcus pyogenes NOT DETECTED NOT DETECTED Final   A.calcoaceticus-baumannii NOT DETECTED NOT DETECTED Final    Bacteroides fragilis NOT DETECTED NOT DETECTED Final   Enterobacterales NOT DETECTED NOT DETECTED Final   Enterobacter cloacae complex NOT DETECTED NOT DETECTED Final   Escherichia coli NOT DETECTED NOT DETECTED Final   Klebsiella aerogenes NOT DETECTED NOT DETECTED Final   Klebsiella oxytoca NOT DETECTED NOT DETECTED Final   Klebsiella pneumoniae NOT DETECTED NOT DETECTED Final   Proteus species NOT DETECTED NOT DETECTED Final   Salmonella species NOT DETECTED NOT DETECTED Final   Serratia marcescens NOT DETECTED NOT DETECTED Final   Haemophilus influenzae NOT DETECTED NOT DETECTED Final   Neisseria meningitidis NOT DETECTED NOT DETECTED Final   Pseudomonas aeruginosa NOT DETECTED NOT DETECTED Final   Stenotrophomonas maltophilia NOT DETECTED NOT DETECTED Final   Candida albicans NOT DETECTED NOT DETECTED Final   Candida auris NOT DETECTED NOT DETECTED Final   Candida glabrata NOT DETECTED NOT DETECTED Final   Candida krusei NOT DETECTED NOT DETECTED Final   Candida parapsilosis NOT DETECTED NOT DETECTED Final   Candida tropicalis NOT DETECTED NOT DETECTED  Final   Cryptococcus neoformans/gattii NOT DETECTED NOT DETECTED Final    Comment: Performed at [facility], [address], [place], [place] [address]  Resp panel by RT-PCR (RSV, Flu A&B, Covid) Anterior Nasal Swab     Status: None   Collection Time: 02/19/24  8:22 AM   Specimen: Anterior Nasal Swab  Result Value Ref Range Status   SARS Coronavirus 2 by RT PCR NEGATIVE NEGATIVE Final   Influenza A by PCR NEGATIVE NEGATIVE Final   Influenza B by PCR NEGATIVE NEGATIVE Final    Comment: (NOTE) The Xpert Xpress SARS-CoV-2/FLU/RSV plus assay is intended as an aid in the diagnosis of influenza from Nasopharyngeal swab specimens and should not be used as a sole basis for treatment. Nasal washings and aspirates are unacceptable for Xpert Xpress SARS-CoV-2/FLU/RSV testing.  Fact Sheet for  Patients: BloggerCourse.com  Fact Sheet for Healthcare Providers: SeriousBroker.it  This test is not yet approved or cleared by the [place]  FDA and has been authorized for detection and/or diagnosis of SARS-CoV-2 by FDA under an Emergency Use Authorization (EUA). This EUA will remain in effect (meaning this test can be used) for the duration of the COVID-19 declaration under Section 564(b)(1) of the Act, 21 U.S.C. section 360bbb-3(b)(1), unless the authorization is terminated or revoked.     Resp Syncytial Virus by PCR NEGATIVE NEGATIVE Final    Comment: (NOTE) Fact Sheet for Patients: BloggerCourse.com  Fact Sheet for Healthcare Providers: SeriousBroker.it  This test is not yet approved or cleared by the [place]  FDA and has been authorized for detection and/or diagnosis of SARS-CoV-2 by FDA under an Emergency Use Authorization (EUA). This EUA will remain in effect (meaning this test can be used) for the duration of the COVID-19 declaration under Section 564(b)(1) of the Act, 21 U.S.C. section 360bbb-3(b)(1), unless the authorization is terminated or revoked.  Performed at [facility], [address], [place], [place] [address]     Radiology Reports No results found.  SIGNED: Bobbetta Burnet, MD, FHM. FAAFP. Arlin Benes - [facility] hospitalist Time spent - 55 min.  In seeing, evaluating and examining the patient. Reviewing medical records, labs, drawn plan of care. [facility],  Pager (please use [email or web address] to page/ text) Please use Epic Secure Chat for non-urgent communication (7AM-7PM)  If 7PM-7AM, please contact night-coverage [email or web address], 02/21/2024, 12:29 PM

## 2024-02-21 NOTE — Significant Event (Signed)
 Rapid Response Event Note   Reason for Call :  Unsustained episodes of apnea followed by bradycardia and hypoxia.   Initial Focused Assessment:  Pt lying in bed with eyes closed. He is having periods of apnea. When he goes apneic, his SpO2 drops to 83% and his HR drops to 30s/40s. He awakens easily and denies CP/SOB/dizziness. Lungs diminished t/o. Skin warm/dry.  He says he has been told before that he needs to have a sleep study to evaluate for sleep apnea.   HR-73, BP-170/82, RR-16, SpO2-95% on RA.  Pt placed on 2L [place].   Interventions:  [place] 2L   Plan of Care:  Pt appears to have sleep apnea. He is asymptomatic. Continue to monitor. Please all RRT if further assistance needed.   Event Summary:   MD Notified: Dr Marquette Sites notified by bedside RN  Call [phone] Arrival [phone] End [id number]  Dorrine Gaudy, RN

## 2024-02-21 NOTE — Progress Notes (Signed)
 Mobility Specialist Progress Note;   02/21/24 1033  Mobility  Activity Ambulated with assistance in hallway  Level of Assistance Contact guard assist, steadying assist  Assistive Device None  Distance Ambulated (ft) 200 ft  Activity Response Tolerated well  Mobility Referral Yes  Mobility visit 1 Mobility  Mobility Specialist Start Time (ACUTE ONLY) 1033  Mobility Specialist Stop Time (ACUTE ONLY) 1040  Mobility Specialist Time Calculation (min) (ACUTE ONLY) 7 min   Pt agreeable to mobility. Required light MinG assistance during ambulation for safety. VSS on RA and no c/o when asked. Pt returned back to bed with all needs met, call bell in reach.   Janit Meline Mobility Specialist Please contact via SecureChat or [employer] [phone]

## 2024-02-22 ENCOUNTER — Inpatient Hospital Stay (HOSPITAL_COMMUNITY)

## 2024-02-22 DIAGNOSIS — I5033 Acute on chronic diastolic (congestive) heart failure: Secondary | ICD-10-CM

## 2024-02-22 LAB — CULTURE, BLOOD (ROUTINE X 2): Special Requests: ADEQUATE

## 2024-02-22 LAB — ECHOCARDIOGRAM COMPLETE
Area-P 1/2: 3.47 cm2
Calc EF: 62.4 %
Height: 71 in
S' Lateral: 2.85 cm
Single Plane A2C EF: 63.6 %
Single Plane A4C EF: 60.3 %
Weight: 4412.8 [oz_av]

## 2024-02-22 LAB — BASIC METABOLIC PANEL WITH GFR
Anion gap: 8 (ref 5–15)
BUN: 11 mg/dL (ref 6–20)
CO2: 26 mmol/L (ref 22–32)
Calcium: 8.7 mg/dL — ABNORMAL LOW (ref 8.9–10.3)
Chloride: 99 mmol/L (ref 98–111)
Creatinine, Ser: 0.91 mg/dL (ref 0.61–1.24)
GFR, Estimated: >60 mL/min (ref >60)
Glucose, Bld: 133 mg/dL — ABNORMAL HIGH (ref 70–99)
Potassium: 4.4 mmol/L (ref 3.5–5.1)
Sodium: 133 mmol/L — ABNORMAL LOW (ref 135–145)

## 2024-02-22 MED ORDER — CEFADROXIL 500 MG PO CAPS
1000.0000 mg | ORAL_CAPSULE | Freq: Two times a day (BID) | ORAL | Status: DC
  Administered 2024-02-23 – 2024-02-25 (×5): 1000 mg via ORAL
  Filled 2024-02-22 (×6): qty 2

## 2024-02-22 NOTE — Progress Notes (Signed)
  Echocardiogram 2D Echocardiogram has been performed.  Royden Corin 02/22/2024, 8:54 AM

## 2024-02-22 NOTE — Progress Notes (Addendum)
 Progress Note  Patient Name: Alex [name] Date of Encounter: 02/22/2024 [facility] Cardiologist: Jann Melody, MD   Interval Summary    No complaints this morning. Breathing much improved. On RA.   Vital Signs Vitals:   02/21/24 1559 02/21/24 1947 02/21/24 2309 02/22/24 0414  BP: (!) 143/97 129/73 (!) 148/82 (!) 148/76  Pulse: 92 76 79 71  Resp: 18 20 18 20   Temp: 97.7 F (36.5 C) 98 F (36.7 C) 98.3 F (36.8 C) 98.2 F (36.8 C)  TempSrc: Oral Oral Oral Oral  SpO2: 98% 96% 95% 100%  Weight:    125.1 kg  Height:        Intake/Output Summary (Last 24 hours) at 02/22/2024 0741 Last data filed at 02/21/2024 1600 Gross per 24 hour  Intake 240 ml  Output 1300 ml  Net -1060 ml      02/22/2024    4:14 AM 02/21/2024    4:16 AM 02/20/2024    4:45 AM  Last 3 Weights  Weight (lbs) 275 lb 12.8 oz 279 lb 11.2 oz 284 lb 6.3 oz  Weight (kg) 125.102 kg 126.871 kg 129 kg      Telemetry/ECG   Sinus Rhythm with episodes of nocturnal bradycardia - Personally Reviewed  Physical Exam  GEN: No acute distress.   Neck: No JVD Cardiac: RRR, no murmurs, rubs, or gallops.  Respiratory: Diminished in bases  GI: Soft, nontender, non-distended  MS: Unna boots   Assessment & Plan   [name] s a 53 year old with hypertension, prior stroke, and COPD who presents with leg swelling   Hypertensive emergency CHF, previously HFpEF -- Presented with hypertensive emergency with blood pressure as high as 213/105 -- Chest x-ray with low lung volumes and mild bibasilar atelectasis, BNP 62 --On IV Lasix  80 mg twice daily, net -4.5 L and weight down from 283-275lbs -- BP improved, recheck in the room 130/60s -- Continue amlodipine  10 mg daily, hydralazine  100 mg 3 times daily, Entresto 97-103 twice daily, spironolactone 25 mg daily. No BB for now with episodes of nocturnal bradycardia  -- echo pending  CAD w/ coronary artery calcifications noted in LAD, circumflex and  aorta -- No anginal symptoms -- Continue aspirin , Crestor  20 mg daily -- Echo pending  Suspected sleep apnea -- Noted to have episodes of apnea with bradycardia and hypoxia on telemetry with sleeping  -- Will need outpatient sleep study  COPD Tobacco use -- cessation advised   Per Primary Morbid Obesity  Cellulitis  For questions or updates, please contact [facility] Please consult [email or web address] for contact info under       Signed, Alex Nailer, NP   I have personally seen and examined the patient.  My HPI, Exam, and assessment and plan are below, independent of the NPP above.  53 yo M with Tobacco abuse with HFpEF (acute) and HTN emergency. Improved LE swelling. No CP, SOB, Palpitations.  No sx with nocturnal bradycardia, still contemplative about smoking cessation.  Exam notable for  Gen: no distress, morbid obesity  Cardiac: No Rubs or Gallops, no Murmur, RRR +2 radial pulses Respiratory: Clear to auscultation bilaterally, normal effort, normal  respiratory rate GI: Soft, nontender, non-distended  MS: edema above UNNA boot;  moves all extremities Integument: Skin feels warm Neuro:  At time of evaluation, alert and oriented to person/place/time/situation  Psych: Normal affect, patient feels ok  Tele: SR  to nocturnal sinus bradycardia Personally reviewed relevant tests: Echo shows preserved LVEF, full  study pending  In assessment and plan:   Hypertensive emergency & Acute HFpEF - Entresto max dose - Continue hydralazine  50 mg TID, may need 75 mg PO BID - Continue MRA - continue IV Lasix  (-6 L) - UNNA boot  - needs outpatient food support (consider outpatient value based care consult )   Coronary artery disease - aspirin  81 mg - rosuvastatin  20 mg - no WMA   Morbid obesity STOP BANG 5 with daytime somnolence Nocturnal Bradycardia - outpatient sleep study    Chronic obstructive pulmonary disease (COPD) Tobacco use disorder- started age  ~9 - contemplative of smoking cessation; his roommate smokes and he cannot leave that given housing issues - rest as per primary   Cellulitis - as per primary  Gloriann Larger, MD FASE [facility] Cardiologist [facility]  [address], [address] [place], [place] [address] [phone]  10:18 AM

## 2024-02-22 NOTE — Progress Notes (Addendum)
 PROGRESS NOTE    Alex [name]  ZOX:[id number] DOB: 1971-05-27 DOA: 02/18/2024 PCP: [facility]    Brief Narrative:   Alex [name] is a 53 y.o. male [employer] veteran with past medical history significant for HTN, chronic diastolic congestive heart failure, CAD, COPD, CVA, tobacco use disorder, obesity who presented to [facility] ED on 02/18/2024 with complaints of progressive lower extremity edema.  He reports redness and pain to his lower extremities.  Denies chest pain.  Patient is fairly isolated socially, lives with a roommate who recently underwent surgery.  Has not been compliant with his medications and not following up with the [place] as scheduled.  In the ED, temperature 98.5 F, HR 80, RR 20, BP 154/82, SpO2 87% on room air and placed on 2 L nasal cannula.  WBC 10.8, hemoglobin 13.4, platelet count 391.  Sodium 136, potassium 4.0, chloride 100, CO2 27, glucose 107, BUN 11, creatinine 0.95.  BNP 67.0.  High-sensitivity troponin 17> 20.  Procalcitonin less than 0.10.  D-dimer elevated 3.25.  EtOH level less than 15.  Chest x-ray with low lung volumes and mid bibasilar atelectasis. Urinalysis showed no signs for infection, but had elevated specific gravity of 1.032. Due to the elevated D-dimer a CT angiogram of the chest have been obtained which showed no evidence of a pulmonary embolism and moderate hepatic steatosis. Blood cultures have been obtained. Patient has been given an DuoNeb breathing treatment. He was noted to have a 22nd episode of V. tach with conversion back into a normal sinus rhythm.   Assessment & Plan:   Acute hypoxic respiratory failure, POA Acute on chronic diastolic congestive heart failure Hypertensive emergency  Hx HTN; medication noncompliance Patient presenting with progressive lower extremity edema in the setting of hypertensive emergency, medication noncompliance and likely dietary indiscretions.  TTE with LVEF 60-65%, LV with no regional wall motion normalities,  moderate asymptomatic LVH, grade 1 diastolic dysfunction, LA mildly dilated, trivial MR, no aortic stenosis -- Cardiology following, appreciate assistance -- Net negative 1.1 L past 24 hours, net  negative 4.6 L since admission -- Wt 129>>125.1kg -- Amlodipine  10 mg p.o. daily -- Hydralazine  100 mg p.o. 3 times daily -- Entresto 97-103 mg p.o. twice daily -- Spironolactone 25 mg p.o. daily -- Furosemide  80 mg IV twice daily -- Continue aspirin  and statin -- Strict I's and O's, daily weights -- BMP daily  Lower extremity cellulitis Patient presenting with progressive lower extremity swelling, erythema.  Concerning for cellulitis given elevated WBC count on admission.  Likely large contributing factor is his CHF as legs above. -- Cefazolin 2 g IV every 8 hours; likely will transition to cefadroxil tomorrow to complete 10-day course -- Unna boots  CAD with coronary artery calcifications LAD, circumflex, aorta -- Aspirin  81 mg PO BID -- Crestor  20 mg p.o. daily  Ascending/aortic root dilation TTE with findings of mild dilation of the ascending aorta measuring 40 mm, aortic root measuring 41 mm.  Continue annual surveillance.  COPD Not on any controlling inhalers outpatient. -- Albuterol neb every 4 hours.  Wheezing/shortness of breath  History of CVA -- Continue aspirin  and statin  Tobacco use disorder Endorses smoking a pack and 1/2 cigarettes/day.  Counseled on need for complete cessation/abstinence. -- Nicotine  patch  Suspected sleep apnea Noted to have episodes of apnea with bradycardia, hypoxia on telemetry while sleeping. -- Outpatient sleep study  Obesity, class II Body mass index is 38.47 kg/m.  Weakness/ability/deconditioning/gait disturbance: -- PT/OT recommending home health on discharge, TOC  consulted for assistance -- Continue therapy efforts while inpatient   DVT prophylaxis: enoxaparin  (LOVENOX ) injection 40 mg Start: 02/20/24 2200 SCDs Start: 02/19/24  0008    Code Status: Full Code Family Communication: No family present at bedside this morning  Disposition Plan:  Level of care: Progressive Status is: Inpatient Remains inpatient appropriate because: IV diuresis, IV antibiotics    Consultants:  Cardiology  Procedures:  TTE  Antimicrobials:  Cefazolin 6/17>>   Subjective: Patient seen examined bedside, lying in bed.  Currently undergoing echocardiogram.  Dyspnea improved.  Oxygen now titrated off.  Discussed [facility], [facility].  Appreciative of the care he is receiving.  No other Alex [name] complaints, concerns or questions at this time.  Denies headache, no dizziness, no chest pain, no palpitations, no abdominal pain, no fever/chills/night sweats, no nausea/vomiting/diarrhea, no focal weakness, no fatigue, no paresthesia.  No acute events overnight per nursing.  Objective: Vitals:   02/21/24 2309 02/22/24 0414 02/22/24 0750 02/22/24 1208  BP: (!) 148/82 (!) 148/76 (!) 173/114 112/77  Pulse: 79 71 92 85  Resp: 18 20 20 18   Temp: 98.3 F (36.8 C) 98.2 F (36.8 C) 97.8 F (36.6 C) 97.8 F (36.6 C)  TempSrc: Oral Oral Oral Oral  SpO2: 95% 100%  91%  Weight:  125.1 kg    Height:        Intake/Output Summary (Last 24 hours) at 02/22/2024 1345 Last data filed at 02/22/2024 1212 Gross per 24 hour  Intake 240 ml  Output 1150 ml  Net -910 ml   Filed Weights   02/20/24 0445 02/21/24 0416 02/22/24 0414  Weight: 129 kg 126.9 kg 125.1 kg    Examination:  Physical Exam: GEN: NAD, alert and oriented x 3, chronically ill appearance, appears older than stated age HEENT: NCAT, PERRL, EOMI, sclera clear, MMM PULM: Breath sounds slight diminished lower bases with crackles, no wheezing, normal respiratory effort, on room air with SpO2 94% at rest CV: RRR w/o M/G/R GI: abd soft, NTND, NABS, no R/G/M MSK: + Bilateral lower extremity peripheral edema with Unna boots in place, moves all  extremities independently NEURO: No focal neurological deficit PSYCH: Depressed mood, flat affect Integumentary: No concerning rashes/lesions/wounds noted on exposed skin surfaces    Data Reviewed: I have personally reviewed following labs and imaging studies  CBC: Recent Labs  Lab 02/18/24 1931 02/19/24 0608 02/21/24 0818  WBC 10.8* 10.1 8.6  NEUTROABS 7.9* 7.7 5.9  HGB 13.4 12.8* 13.8  HCT 42.8 40.6 43.2  MCV 83.4 83.4 82.1  PLT 391 354 334   Basic Metabolic Panel: Recent Labs  Lab 02/18/24 1931 02/18/24 2141 02/19/24 0600 02/20/24 0349 02/21/24 0818 02/22/24 0916  NA 136  --  136 135 136 133*  K 4.0  --  3.8 3.9 4.4 4.4  CL 100  --  101 100 98 99  CO2 27  --  28 28 26 26   GLUCOSE 107*  --  153* 103* 96 133*  BUN 11  --  10 10 9 11   CREATININE 0.95  --  0.99 0.93 0.87 0.91  CALCIUM  9.5  --  8.8* 8.6* 8.9 8.7*  MG  --  2.2 2.0  --   --   --    GFR: Estimated Creatinine Clearance: 125 mL/min (by C-G formula based on SCr of 0.91 mg/dL). Liver Function Tests: Recent Labs  Lab 02/19/24 0600 02/21/24 0818  AST 17 17  ALT 19 14  ALKPHOS 57  56  BILITOT 0.4 0.6  PROT 7.4 8.1  ALBUMIN 2.6* 2.7*   No results for input(s): LIPASE, AMYLASE in the last 168 hours. No results for input(s): AMMONIA in the last 168 hours. Coagulation Profile: No results for input(s): INR, PROTIME in the last 168 hours. Cardiac Enzymes: No results for input(s): CKTOTAL, CKMB, CKMBINDEX, TROPONINI in the last 168 hours. BNP (last 3 results) No results for input(s): PROBNP in the last 8760 hours. HbA1C: No results for input(s): HGBA1C in the last 72 hours. CBG: No results for input(s): GLUCAP in the last 168 hours. Lipid Profile: Recent Labs    02/21/24 0340  CHOL 145  HDL 30*  LDLCALC 98  TRIG 84  CHOLHDL 4.8   Thyroid Function Tests: No results for input(s): TSH, T4TOTAL, FREET4, T3FREE, THYROIDAB in the last 72 hours. Anemia Panel: No  results for input(s): VITAMINB12, FOLATE, FERRITIN, TIBC, IRON, RETICCTPCT in the last 72 hours. Sepsis Labs: Recent Labs  Lab 02/18/24 2141 02/20/24 0349  PROCALCITON <0.10 <0.10    Recent Results (from the past 240 hours)  Blood culture (routine x 2)     Status: Abnormal   Collection Time: 02/18/24  8:53 PM   Specimen: BLOOD  Result Value Ref Range Status   Specimen Description BLOOD SITE NOT SPECIFIED  Final   Special Requests   Final    BOTTLES DRAWN AEROBIC AND ANAEROBIC Blood Culture adequate volume   Culture  Setup Time   Final    GRAM POSITIVE COCCI IN CLUSTERS IN BOTH AEROBIC AND ANAEROBIC BOTTLES CRITICAL RESULT CALLED TO, READ BACK BY AND VERIFIED WITH: PHARMD ELIZABETH MARTIN ON 02/20/24 @ 1524 BY DRT    Culture (A)  Final    STAPHYLOCOCCUS COHNII THE SIGNIFICANCE OF ISOLATING THIS ORGANISM FROM A SINGLE SET OF BLOOD CULTURES WHEN MULTIPLE SETS ARE DRAWN IS UNCERTAIN. PLEASE NOTIFY THE MICROBIOLOGY DEPARTMENT WITHIN ONE WEEK IF SPECIATION AND SENSITIVITIES ARE REQUIRED. Performed at [facility], [address], [place], [place] [address]    Report Status 02/22/2024 FINAL  Final  Blood Culture ID Panel (Reflexed)     Status: Abnormal   Collection Time: 02/18/24  8:53 PM  Result Value Ref Range Status   Enterococcus faecalis NOT DETECTED NOT DETECTED Final   Enterococcus Faecium NOT DETECTED NOT DETECTED Final   Listeria monocytogenes NOT DETECTED NOT DETECTED Final   Staphylococcus species DETECTED (A) NOT DETECTED Final    Comment: CRITICAL RESULT CALLED TO, READ BACK BY AND VERIFIED WITH: PHARMD ELIZABETH MARTIN ON 02/20/24 @ 1524 BY DRT    Staphylococcus aureus (BCID) NOT DETECTED NOT DETECTED Final   Staphylococcus epidermidis NOT DETECTED NOT DETECTED Final   Staphylococcus lugdunensis NOT DETECTED NOT DETECTED Final   Streptococcus species NOT DETECTED NOT DETECTED Final   Streptococcus agalactiae NOT DETECTED NOT DETECTED Final    Streptococcus pneumoniae NOT DETECTED NOT DETECTED Final   Streptococcus pyogenes NOT DETECTED NOT DETECTED Final   A.calcoaceticus-baumannii NOT DETECTED NOT DETECTED Final   Bacteroides fragilis NOT DETECTED NOT DETECTED Final   Enterobacterales NOT DETECTED NOT DETECTED Final   Enterobacter cloacae complex NOT DETECTED NOT DETECTED Final   Escherichia coli NOT DETECTED NOT DETECTED Final   Klebsiella aerogenes NOT DETECTED NOT DETECTED Final   Klebsiella oxytoca NOT DETECTED NOT DETECTED Final   Klebsiella pneumoniae NOT DETECTED NOT DETECTED Final   Proteus species NOT DETECTED NOT DETECTED Final   Salmonella species NOT DETECTED NOT DETECTED Final   Serratia marcescens NOT DETECTED  NOT DETECTED Final   Haemophilus influenzae NOT DETECTED NOT DETECTED Final   Neisseria meningitidis NOT DETECTED NOT DETECTED Final   Pseudomonas aeruginosa NOT DETECTED NOT DETECTED Final   Stenotrophomonas maltophilia NOT DETECTED NOT DETECTED Final   Candida albicans NOT DETECTED NOT DETECTED Final   Candida auris NOT DETECTED NOT DETECTED Final   Candida glabrata NOT DETECTED NOT DETECTED Final   Candida krusei NOT DETECTED NOT DETECTED Final   Candida parapsilosis NOT DETECTED NOT DETECTED Final   Candida tropicalis NOT DETECTED NOT DETECTED Final   Cryptococcus neoformans/gattii NOT DETECTED NOT DETECTED Final    Comment: Performed at [facility], [address] [place], [place] [address]  Resp panel by RT-PCR (RSV, Flu A&B, Covid) Anterior Nasal Swab     Status: None   Collection Time: 02/19/24  8:22 AM   Specimen: Anterior Nasal Swab  Result Value Ref Range Status   SARS Coronavirus 2 by RT PCR NEGATIVE NEGATIVE Final   Influenza A by PCR NEGATIVE NEGATIVE Final   Influenza B by PCR NEGATIVE NEGATIVE Final    Comment: (NOTE) The Xpert Xpress SARS-CoV-2/FLU/RSV plus assay is intended as an aid in the diagnosis of influenza from Nasopharyngeal swab specimens and should not be used as  a sole basis for treatment. Nasal washings and aspirates are unacceptable for Xpert Xpress SARS-CoV-2/FLU/RSV testing.  Fact Sheet for Patients: BloggerCourse.com  Fact Sheet for Healthcare Providers: SeriousBroker.it  This test is not yet approved or cleared by the [place]  FDA and has been authorized for detection and/or diagnosis of SARS-CoV-2 by FDA under an Emergency Use Authorization (EUA). This EUA will remain in effect (meaning this test can be used) for the duration of the COVID-19 declaration under Section 564(b)(1) of the Act, 21 U.S.C. section 360bbb-3(b)(1), unless the authorization is terminated or revoked.     Resp Syncytial Virus by PCR NEGATIVE NEGATIVE Final    Comment: (NOTE) Fact Sheet for Patients: BloggerCourse.com  Fact Sheet for Healthcare Providers: SeriousBroker.it  This test is not yet approved or cleared by the [place]  FDA and has been authorized for detection and/or diagnosis of SARS-CoV-2 by FDA under an Emergency Use Authorization (EUA). This EUA will remain in effect (meaning this test can be used) for the duration of the COVID-19 declaration under Section 564(b)(1) of the Act, 21 U.S.C. section 360bbb-3(b)(1), unless the authorization is terminated or revoked.  Performed at [facility], [address], [place], [place] [address]          Radiology Studies: ECHOCARDIOGRAM COMPLETE Result Date: 02/22/2024    ECHOCARDIOGRAM REPORT   Patient Name:   Alex [name] Date of Exam: 02/22/2024 Medical Rec #:  [id number]      Height:       71.0 in Accession #:    [id number]     Weight:       275.8 lb Date of Birth:  02/25/1971      BSA:          2.417 m Patient Age:    53 years       BP:           148/76 mmHg Patient Gender: M              HR:           60 bpm. Exam Location:  Inpatient Procedure: 2D Echo, Cardiac Doppler and Color  Doppler (Both Spectral and Color  Flow Doppler were utilized during procedure). Indications:    I50.40* Unspecified combined systolic (congestive) and diastolic                 (congestive) heart failure  History:        Patient has prior history of Echocardiogram examinations, most                 recent 11/21/2021. CHF, Abnormal ECG, Arrythmias:PSVT; Risk                 Factors:Current Smoker and Hypertension. ETOH. Substance abuse.  Sonographer:    Raynelle Callow RDCS Referring Phys: [id number] [facility] A CHANDRASEKHAR IMPRESSIONS  1. Left ventricular ejection fraction, by estimation, is 60 to 65%. The left ventricle has normal function. The left ventricle has no regional wall motion abnormalities. There is moderate asymmetric left ventricular hypertrophy of the basal and septal segments. Left ventricular diastolic parameters are consistent with Grade I diastolic dysfunction (impaired relaxation).  2. Right ventricular systolic function is normal. The right ventricular size is normal. Tricuspid regurgitation signal is inadequate for assessing PA pressure.  3. Left atrial size was mildly dilated.  4. The mitral valve is abnormal. Trivial mitral valve regurgitation. No evidence of mitral stenosis.  5. The aortic valve is tricuspid. Aortic valve regurgitation is not visualized. Aortic valve sclerosis is present, with no evidence of aortic valve stenosis.  6. Aortic dilatation noted. There is mild dilatation of the ascending aorta, measuring 40 mm. There is mild dilatation of the aortic root, measuring 41 mm. FINDINGS  Left Ventricle: Left ventricular ejection fraction, by estimation, is 60 to 65%. The left ventricle has normal function. The left ventricle has no regional wall motion abnormalities. The left ventricular internal cavity size was normal in size. There is  moderate asymmetric left ventricular hypertrophy of the basal and septal segments. Left ventricular diastolic parameters are consistent with Grade I  diastolic dysfunction (impaired relaxation). Right Ventricle: The right ventricular size is normal. No increase in right ventricular wall thickness. Right ventricular systolic function is normal. Tricuspid regurgitation signal is inadequate for assessing PA pressure. Left Atrium: Left atrial size was mildly dilated. Right Atrium: Right atrial size was normal in size. Pericardium: There is no evidence of pericardial effusion. Mitral Valve: The mitral valve is abnormal. There is mild thickening of the posterior mitral valve leaflet(s). There is mild calcification of the mitral valve leaflet(s). Trivial mitral valve regurgitation. No evidence of mitral valve stenosis. Tricuspid Valve: The tricuspid valve is normal in structure. Tricuspid valve regurgitation is trivial. No evidence of tricuspid stenosis. Aortic Valve: The aortic valve is tricuspid. Aortic valve regurgitation is not visualized. Aortic valve sclerosis is present, with no evidence of aortic valve stenosis. Pulmonic Valve: The pulmonic valve was normal in structure. Pulmonic valve regurgitation is not visualized. No evidence of pulmonic stenosis. Aorta: Aortic dilatation noted. There is mild dilatation of the ascending aorta, measuring 40 mm. There is mild dilatation of the aortic root, measuring 41 mm. IAS/Shunts: No atrial level shunt detected by color flow Doppler. Additional Comments: 3D was performed not requiring image post processing on an independent workstation and was indeterminate.  LEFT VENTRICLE PLAX 2D LVIDd:         4.30 cm     Diastology LVIDs:         2.85 cm     LV e' medial:    5.22 cm/s LV PW:         1.70 cm  LV E/e' medial:  15.5 LV IVS:        1.60 cm     LV e' lateral:   5.98 cm/s LVOT diam:     2.40 cm     LV E/e' lateral: 13.5 LV SV:         123 LV SV Index:   51 LVOT Area:     4.52 cm  LV Volumes (MOD) LV vol d, MOD A2C: 85.6 ml LV vol d, MOD A4C: 94.0 ml LV vol s, MOD A2C: 31.2 ml LV vol s, MOD A4C: 37.3 ml LV SV MOD A2C:      54.4 ml LV SV MOD A4C:     94.0 ml LV SV MOD BP:      57.4 ml RIGHT VENTRICLE             IVC RV S prime:     14.30 cm/s  IVC diam: 1.70 cm TAPSE (M-mode): 2.5 cm LEFT ATRIUM             Index        RIGHT ATRIUM           Index LA diam:        4.00 cm 1.65 cm/m   RA Area:     12.70 cm LA Vol (A2C):   28.6 ml 11.83 ml/m  RA Volume:   26.20 ml  10.84 ml/m LA Vol (A4C):   16.6 ml 6.87 ml/m LA Biplane Vol: 22.2 ml 9.18 ml/m  AORTIC VALVE LVOT Vmax:   124.00 cm/s LVOT Vmean:  83.400 cm/s LVOT VTI:    0.272 m  AORTA Ao Root diam: 4.10 cm Ao Asc diam:  3.95 cm MITRAL VALVE MV Area (PHT): 3.47 cm    SHUNTS MV Decel Time: 218 msec    Systemic VTI:  0.27 m MV E velocity: 80.82 cm/s  Systemic Diam: 2.40 cm MV A velocity: 87.78 cm/s MV E/A ratio:  0.92 Janelle Mediate MD Electronically signed by Janelle Mediate MD Signature Date/Time: 02/22/2024/11:14:14 AM    Final         Scheduled Meds:  amLODipine   10 mg Oral Daily   aspirin  EC  81 mg Oral Daily   docusate sodium  200 mg Oral Daily   enoxaparin  (LOVENOX ) injection  40 mg Subcutaneous QHS   ferrous sulfate  325 mg Oral BID WC   furosemide   80 mg Intravenous BID   hydrALAZINE   100 mg Oral TID   nicotine   21 mg Transdermal Daily   rosuvastatin   20 mg Oral QHS   sacubitril-valsartan  1 tablet Oral BID   spironolactone  25 mg Oral Daily   Continuous Infusions:   ceFAZolin (ANCEF) IV 2 g (02/22/24 0721)     LOS: 3 days    Time spent: 53 minutes spent on 02/22/2024 caring for this patient face-to-face including chart review, ordering labs/tests, documenting, discussion with nursing staff, consultants, updating family and interview/physical exam    Rema Care [place], DO [facility] Available via Epic secure chat 7am-7pm After these hours, please refer to coverage provider listed on [email or web address] 02/22/2024, 1:45 PM

## 2024-02-22 NOTE — Progress Notes (Signed)
 Mobility Specialist Progress Note;   02/22/24 0902  Mobility  Activity Ambulated with assistance in hallway  Level of Assistance Contact guard assist, steadying assist  Assistive Device None  Distance Ambulated (ft) 225 ft  Activity Response Tolerated well  Mobility Referral Yes  Mobility visit 1 Mobility  Mobility Specialist Start Time (ACUTE ONLY) 0902  Mobility Specialist Stop Time (ACUTE ONLY) 0911  Mobility Specialist Time Calculation (min) (ACUTE ONLY) 9 min   Pt agreeable to mobility. Required light MinG assistance during ambulation for safety. VSS throughout and no c/o when asked. Pt returned back to bed with all needs met, call bell in reach.   Janit Meline Mobility Specialist Please contact via SecureChat or [employer] [phone]

## 2024-02-22 NOTE — Progress Notes (Signed)
 Occupational Therapy Treatment Patient Details Name: Alex [name] MRN: [id number] DOB: 09-20-1970 Today's Date: 02/22/2024   History of present illness Pt is a 53 y.o. male presenting 02/18/24 with BLE pitting edema, generalized weakness, and weeping wounds. PMH significant for HTN, CVA, HTN, MI, COPD, medication non-compliance.   OT comments  Pt progressing well towards goals. Treatment session focused on skill carryover of AE, and education regarding lifestyle choices. Educated pt on benefits of physical activity and dietary modification to follow a heart healthyu diet to reduce the risk of re-admission. Continue to recommend HHOT to optimize independence levels. Will continue to follow acutely.       If plan is discharge home, recommend the following:  A little help with walking and/or transfers;A little help with bathing/dressing/bathroom;Assistance with cooking/housework;Help with stairs or ramp for entrance;Assist for transportation   Equipment Recommendations  Tub/shower bench    Recommendations for Other Services      Precautions / Restrictions Precautions Precautions: Fall Recall of Precautions/Restrictions: Intact Restrictions Weight Bearing Restrictions Per Provider Order: No       Balance Overall balance assessment: Mild deficits observed, not formally tested     ADL either performed or assessed with clinical judgement   ADL Overall ADL's : Needs assistance/impaired     Lower Body Dressing: Supervision/safety;Sit to/from stand Lower Body Dressing Details (indicate cue type and reason): Cues for use of sock aid     General ADL Comments: Pt demo return use of AE, educated on lifestyle changes to facilitate independence    Extremity/Trunk Assessment Upper Extremity Assessment Upper Extremity Assessment: Overall WFL for tasks assessed   Lower Extremity Assessment Lower Extremity Assessment: Defer to PT evaluation        Vision   Vision Assessment?: No  apparent visual deficits         Communication Communication Communication: No apparent difficulties   Cognition Arousal: Alert Behavior During Therapy: WFL for tasks assessed/performed Cognition: No apparent impairments     Following commands: Intact        Cueing   Cueing Techniques: Verbal cues        General Comments VSS on RA    Pertinent Vitals/ Pain       Pain Assessment Pain Assessment: No/denies pain   Frequency  Min 1X/week        Progress Toward Goals  OT Goals(current goals can now be found in the care plan section)  Progress towards OT goals: Progressing toward goals  Acute Rehab OT Goals Patient Stated Goal: To shower OT Goal Formulation: With patient Time For Goal Achievement: 03/04/24 Potential to Achieve Goals: Good ADL Goals Pt Will Perform Lower Body Bathing: with modified independence;with adaptive equipment;sit to/from stand Pt Will Perform Lower Body Dressing: with modified independence;with adaptive equipment;sit to/from stand Pt Will Transfer to Toilet: with modified independence;ambulating Pt Will Perform Tub/Shower Transfer: Tub transfer;tub bench;with supervision  Plan         AM-PAC OT 6 Clicks Daily Activity     Outcome Measure   Help from another person eating meals?: None Help from another person taking care of personal grooming?: A Little Help from another person toileting, which includes using toliet, bedpan, or urinal?: A Little Help from another person bathing (including washing, rinsing, drying)?: A Little Help from another person to put on and taking off regular upper body clothing?: A Little Help from another person to put on and taking off regular lower body clothing?: A Little 6 Click Score: 19    End  of Session Equipment Utilized During Treatment: Other (comment) (Reacher and sock aid)  OT Visit Diagnosis: Unsteadiness on feet (R26.81);Muscle weakness (generalized) (M62.81)   Activity Tolerance Patient  tolerated treatment well   Patient Left in chair;with call bell/phone within reach   Nurse Communication Mobility status        Time: 9147-8295 OT Time Calculation (min): 27 min  Charges: OT General Charges $OT Visit: 1 Visit OT Treatments $Self Care/Home Management : 23-37 mins  Alex [name], OT  Acute Rehabilitation Services Office [phone] Secure chat preferred   Alex [name] 02/22/2024, 2:25 PM

## 2024-02-23 LAB — BASIC METABOLIC PANEL WITH GFR
Anion gap: 6 (ref 5–15)
BUN: 14 mg/dL (ref 6–20)
CO2: 28 mmol/L (ref 22–32)
Calcium: 8.7 mg/dL — ABNORMAL LOW (ref 8.9–10.3)
Chloride: 100 mmol/L (ref 98–111)
Creatinine, Ser: 0.95 mg/dL (ref 0.61–1.24)
GFR, Estimated: >60 mL/min (ref >60)
Glucose, Bld: 96 mg/dL (ref 70–99)
Potassium: 3.9 mmol/L (ref 3.5–5.1)
Sodium: 134 mmol/L — ABNORMAL LOW (ref 135–145)

## 2024-02-23 NOTE — Progress Notes (Signed)
 PROGRESS NOTE    Alex [name]  FMW:[id number] DOB: 04-Aug-1971 DOA: 02/18/2024 PCP: [facility]    Brief Narrative:   Alex [name] is a 53 y.o. male [employer] veteran with past medical history significant for HTN, chronic diastolic congestive heart failure, CAD, COPD, CVA, tobacco use disorder, obesity who presented to [facility] ED on 02/18/2024 with complaints of progressive lower extremity edema.  He reports redness and pain to his lower extremities.  Denies chest pain.  Patient is fairly isolated socially, lives with a roommate who recently underwent surgery.  Has not been compliant with his medications and not following up with the [facility] as scheduled.  In the ED, temperature 98.5 F, HR 80, RR 20, BP 154/82, SpO2 87% on room air and placed on 2 L nasal cannula.  WBC 10.8, hemoglobin 13.4, platelet count 391.  Sodium 136, potassium 4.0, chloride 100, CO2 27, glucose 107, BUN 11, creatinine 0.95.  BNP 67.0.  High-sensitivity troponin 17> 20.  Procalcitonin less than 0.10.  D-dimer elevated 3.25.  EtOH level less than 15.  Chest x-ray with low lung volumes and mid bibasilar atelectasis. Urinalysis showed no signs for infection, but had elevated specific gravity of 1.032. Due to the elevated D-dimer a CT angiogram of the chest have been obtained which showed no evidence of a pulmonary embolism and moderate hepatic steatosis. Blood cultures have been obtained. Patient has been given an DuoNeb breathing treatment. He was noted to have a 22nd episode of V. tach with conversion back into a normal sinus rhythm.   Assessment & Plan:   Acute hypoxic respiratory failure, POA Acute on chronic diastolic congestive heart failure Hypertensive emergency  Hx HTN; medication noncompliance Patient presenting with progressive lower extremity edema in the setting of hypertensive emergency, medication noncompliance and likely dietary indiscretions.  TTE with LVEF 60-65%, LV with no regional wall motion normalities,  moderate asymptomatic LVH, grade 1 diastolic dysfunction, LA mildly dilated, trivial MR, no aortic stenosis -- Cardiology following, appreciate assistance -- Net negative 2.7 L past 24 hours, net  negative 7.3 L since admission -- Wt 129>>124.9kg -- Amlodipine  10 mg p.o. daily -- Hydralazine  100 mg p.o. 3 times daily -- Entresto  97-103 mg p.o. twice daily -- Spironolactone  25 mg p.o. daily -- Furosemide  80 mg IV twice daily -- Continue aspirin  and statin -- Strict I's and O's, daily weights -- BMP daily  Lower extremity cellulitis Patient presenting with progressive lower extremity swelling, erythema.  Concerning for cellulitis given elevated WBC count on admission.  Likely large contributing factor is his CHF as legs above. -- Cefadroxil  1000 mg PO BID to complete 10-day course -- Unna boots  CAD with coronary artery calcifications LAD, circumflex, aorta -- Aspirin  81 mg PO BID -- Crestor  20 mg p.o. daily  Ascending/aortic root dilation TTE with findings of mild dilation of the ascending aorta measuring 40 mm, aortic root measuring 41 mm.  Continue annual surveillance.  COPD Not on any controlling inhalers outpatient. -- Albuterol  neb every 4 hours.  Wheezing/shortness of breath  History of CVA -- Continue aspirin  and statin  Tobacco use disorder Endorses smoking a pack and 1/2 cigarettes/day.  Counseled on need for complete cessation/abstinence. -- Nicotine  patch  Suspected sleep apnea Noted to have episodes of apnea with bradycardia, hypoxia on telemetry while sleeping. -- Nocturnal O2 assessment tonight -- Outpatient sleep study  Obesity, class II Body mass index is 38.4 kg/m.  Weakness/ability/deconditioning/gait disturbance: -- PT/OT recommending home health on discharge, TOC consulted for assistance --  Continue therapy efforts while inpatient   DVT prophylaxis: enoxaparin  (LOVENOX ) injection 40 mg Start: 02/20/24 2200 SCDs Start: 02/19/24 0008    Code  Status: Full Code Family Communication: No family present at bedside this morning  Disposition Plan:  Level of care: Progressive Status is: Inpatient Remains inpatient appropriate because: IV diuresis    Consultants:  Cardiology  Procedures:  TTE  Antimicrobials:  Cefazolin  6/17>>   Subjective: Patient seen examined bedside, lying in bed.  No specific complaints this morning, dyspnea improved.  RN reports several desaturations overnight while sleeping concerning for sleep apnea.  Discussed will obtain nocturnal O2 study tonight.  Discussed with patient need [facility]  Remains on IV diuretic.  No complaints, concerns or questions at this time.  Denies headache, no dizziness, no chest pain, no palpitations, no abdominal pain, no fever/chills/night sweats, no nausea/vomiting/diarrhea, no focal weakness, no fatigue, no paresthesia.  No acute events overnight per nursing.  Objective: Vitals:   02/23/24 0031 02/23/24 0449 02/23/24 0814 02/23/24 1135  BP: (!) 141/78 (!) 148/79 129/83 111/83  Pulse: 81 70 84 77  Resp: 20 (!) 22 (!) 24 20  Temp: 98.5 F (36.9 C) 98 F (36.7 C) 98.1 F (36.7 C) 98.7 F (37.1 C)  TempSrc: Oral Oral Oral Oral  SpO2: 94% 97%    Weight:  124.9 kg    Height:        Intake/Output Summary (Last 24 hours) at 02/23/2024 1437 Last data filed at 02/23/2024 9141 Gross per 24 hour  Intake 480 ml  Output 2550 ml  Net -2070 ml   Filed Weights   02/21/24 0416 02/22/24 0414 02/23/24 0449  Weight: 126.9 kg 125.1 kg 124.9 kg    Examination:  Physical Exam: GEN: NAD, alert and oriented x 3, chronically ill appearance, appears older than stated age HEENT: NCAT, PERRL, EOMI, sclera clear, MMM PULM: Breath sounds slight diminished lower bases with crackles, no wheezing, normal respiratory effort, on room air with SpO2 97% at rest CV: RRR w/o M/G/R GI: abd soft, NTND, NABS, no R/G/M MSK: + Bilateral lower extremity  peripheral edema with Unna boots in place, moves all extremities independently NEURO: No focal neurological deficit PSYCH: Depressed mood, flat affect Integumentary: No concerning rashes/lesions/wounds noted on exposed skin surfaces    Data Reviewed: I have personally reviewed following labs and imaging studies  CBC: Recent Labs  Lab 02/18/24 1931 02/19/24 0608 02/21/24 0818  WBC 10.8* 10.1 8.6  NEUTROABS 7.9* 7.7 5.9  HGB 13.4 12.8* 13.8  HCT 42.8 40.6 43.2  MCV 83.4 83.4 82.1  PLT 391 354 334   Basic Metabolic Panel: Recent Labs  Lab 02/18/24 2141 02/19/24 0600 02/20/24 0349 02/21/24 0818 02/22/24 0916 02/23/24 0530  NA  --  136 135 136 133* 134*  K  --  3.8 3.9 4.4 4.4 3.9  CL  --  101 100 98 99 100  CO2  --  28 28 26 26 28   GLUCOSE  --  153* 103* 96 133* 96  BUN  --  10 10 9 11 14   CREATININE  --  0.99 0.93 0.87 0.91 0.95  CALCIUM   --  8.8* 8.6* 8.9 8.7* 8.7*  MG 2.2 2.0  --   --   --   --    GFR: Estimated Creatinine Clearance: 119.6 mL/min (by C-G formula based on SCr of 0.95 mg/dL). Liver Function Tests: Recent Labs  Lab 02/19/24 0600 02/21/24 0818  AST 17  17  ALT 19 14  ALKPHOS 57 56  BILITOT 0.4 0.6  PROT 7.4 8.1  ALBUMIN 2.6* 2.7*   No results for input(s): LIPASE, AMYLASE in the last 168 hours. No results for input(s): AMMONIA in the last 168 hours. Coagulation Profile: No results for input(s): INR, PROTIME in the last 168 hours. Cardiac Enzymes: No results for input(s): CKTOTAL, CKMB, CKMBINDEX, TROPONINI in the last 168 hours. BNP (last 3 results) No results for input(s): PROBNP in the last 8760 hours. HbA1C: No results for input(s): HGBA1C in the last 72 hours. CBG: No results for input(s): GLUCAP in the last 168 hours. Lipid Profile: Recent Labs    02/21/24 0340  CHOL 145  HDL 30*  LDLCALC 98  TRIG 84  CHOLHDL 4.8   Thyroid Function Tests: No results for input(s): TSH, T4TOTAL, FREET4, T3FREE,  THYROIDAB in the last 72 hours. Anemia Panel: No results for input(s): VITAMINB12, FOLATE, FERRITIN, TIBC, IRON, RETICCTPCT in the last 72 hours. Sepsis Labs: Recent Labs  Lab 02/18/24 2141 02/20/24 0349  PROCALCITON <0.10 <0.10    Recent Results (from the past 240 hours)  Blood culture (routine x 2)     Status: Abnormal   Collection Time: 02/18/24  8:53 PM   Specimen: BLOOD  Result Value Ref Range Status   Specimen Description BLOOD SITE NOT SPECIFIED  Final   Special Requests   Final    BOTTLES DRAWN AEROBIC AND ANAEROBIC Blood Culture adequate volume   Culture  Setup Time   Final    GRAM POSITIVE COCCI IN CLUSTERS IN BOTH AEROBIC AND ANAEROBIC BOTTLES CRITICAL RESULT CALLED TO, READ BACK BY AND VERIFIED WITH: PHARMD ELIZABETH MARTIN ON 02/20/24 @ 1524 BY DRT    Culture (A)  Final    STAPHYLOCOCCUS COHNII THE SIGNIFICANCE OF ISOLATING THIS ORGANISM FROM A SINGLE SET OF BLOOD CULTURES WHEN MULTIPLE SETS ARE DRAWN IS UNCERTAIN. PLEASE NOTIFY THE MICROBIOLOGY DEPARTMENT WITHIN ONE WEEK IF SPECIATION AND SENSITIVITIES ARE REQUIRED. Performed at [facility], [address] [place], [place] [address]    Report Status 02/22/2024 FINAL  Final  Blood Culture ID Panel (Reflexed)     Status: Abnormal   Collection Time: 02/18/24  8:53 PM  Result Value Ref Range Status   Enterococcus faecalis NOT DETECTED NOT DETECTED Final   Enterococcus Faecium NOT DETECTED NOT DETECTED Final   Listeria monocytogenes NOT DETECTED NOT DETECTED Final   Staphylococcus species DETECTED (A) NOT DETECTED Final    Comment: CRITICAL RESULT CALLED TO, READ BACK BY AND VERIFIED WITH: PHARMD ELIZABETH MARTIN ON 02/20/24 @ 1524 BY DRT    Staphylococcus aureus (BCID) NOT DETECTED NOT DETECTED Final   Staphylococcus epidermidis NOT DETECTED NOT DETECTED Final   Staphylococcus lugdunensis NOT DETECTED NOT DETECTED Final   Streptococcus species NOT DETECTED NOT DETECTED Final   Streptococcus  agalactiae NOT DETECTED NOT DETECTED Final   Streptococcus pneumoniae NOT DETECTED NOT DETECTED Final   Streptococcus pyogenes NOT DETECTED NOT DETECTED Final   A.calcoaceticus-baumannii NOT DETECTED NOT DETECTED Final   Bacteroides fragilis NOT DETECTED NOT DETECTED Final   Enterobacterales NOT DETECTED NOT DETECTED Final   Enterobacter cloacae complex NOT DETECTED NOT DETECTED Final   Escherichia coli NOT DETECTED NOT DETECTED Final   Klebsiella aerogenes NOT DETECTED NOT DETECTED Final   Klebsiella oxytoca NOT DETECTED NOT DETECTED Final   Klebsiella pneumoniae NOT DETECTED NOT DETECTED Final   Proteus species NOT DETECTED NOT DETECTED Final   Salmonella species NOT DETECTED NOT  DETECTED Final   Serratia marcescens NOT DETECTED NOT DETECTED Final   Haemophilus influenzae NOT DETECTED NOT DETECTED Final   Neisseria meningitidis NOT DETECTED NOT DETECTED Final   Pseudomonas aeruginosa NOT DETECTED NOT DETECTED Final   Stenotrophomonas maltophilia NOT DETECTED NOT DETECTED Final   Candida albicans NOT DETECTED NOT DETECTED Final   Candida auris NOT DETECTED NOT DETECTED Final   Candida glabrata NOT DETECTED NOT DETECTED Final   Candida krusei NOT DETECTED NOT DETECTED Final   Candida parapsilosis NOT DETECTED NOT DETECTED Final   Candida tropicalis NOT DETECTED NOT DETECTED Final   Cryptococcus neoformans/gattii NOT DETECTED NOT DETECTED Final    Comment: Performed at [facility], [address], [place], [place] [address]  Resp panel by RT-PCR (RSV, Flu A&B, Covid) Anterior Nasal Swab     Status: None   Collection Time: 02/19/24  8:22 AM   Specimen: Anterior Nasal Swab  Result Value Ref Range Status   SARS Coronavirus 2 by RT PCR NEGATIVE NEGATIVE Final   Influenza A by PCR NEGATIVE NEGATIVE Final   Influenza B by PCR NEGATIVE NEGATIVE Final    Comment: (NOTE) The Xpert Xpress SARS-CoV-2/FLU/RSV plus assay is intended as an aid in the diagnosis of influenza from  Nasopharyngeal swab specimens and should not be used as a sole basis for treatment. Nasal washings and aspirates are unacceptable for Xpert Xpress SARS-CoV-2/FLU/RSV testing.  Fact Sheet for Patients: BloggerCourse.com  Fact Sheet for Healthcare Providers: SeriousBroker.it  This test is not yet approved or cleared by the [place]  FDA and has been authorized for detection and/or diagnosis of SARS-CoV-2 by FDA under an Emergency Use Authorization (EUA). This EUA will remain in effect (meaning this test can be used) for the duration of the COVID-19 declaration under Section 564(b)(1) of the Act, 21 U.S.C. section 360bbb-3(b)(1), unless the authorization is terminated or revoked.     Resp Syncytial Virus by PCR NEGATIVE NEGATIVE Final    Comment: (NOTE) Fact Sheet for Patients: BloggerCourse.com  Fact Sheet for Healthcare Providers: SeriousBroker.it  This test is not yet approved or cleared by the [place]  FDA and has been authorized for detection and/or diagnosis of SARS-CoV-2 by FDA under an Emergency Use Authorization (EUA). This EUA will remain in effect (meaning this test can be used) for the duration of the COVID-19 declaration under Section 564(b)(1) of the Act, 21 U.S.C. section 360bbb-3(b)(1), unless the authorization is terminated or revoked.  Performed at [facility], [address], [place], [place] [address]          Radiology Studies: ECHOCARDIOGRAM COMPLETE Result Date: 02/22/2024    ECHOCARDIOGRAM REPORT   Patient Name:   Alex [name] Date of Exam: 02/22/2024 Medical Rec #:  [id number]      Height:       71.0 in Accession #:    [id number]     Weight:       275.8 lb Date of Birth:  11/06/1970      BSA:          2.417 m Patient Age:    53 years       BP:           148/76 mmHg Patient Gender: M              HR:           60 bpm. Exam Location:   Inpatient Procedure: 2D Echo, Cardiac Doppler and Color Doppler (Both Spectral and Color  Flow Doppler were utilized during procedure). Indications:    I50.40* Unspecified combined systolic (congestive) and diastolic                 (congestive) heart failure  History:        Patient has prior history of Echocardiogram examinations, most                 recent 11/21/2021. CHF, Abnormal ECG, Arrythmias:PSVT; Risk                 Factors:Current Smoker and Hypertension. ETOH. Substance abuse.  Sonographer:    Ellouise Mose RDCS Referring Phys: [id number] [facility] A CHANDRASEKHAR IMPRESSIONS  1. Left ventricular ejection fraction, by estimation, is 60 to 65%. The left ventricle has normal function. The left ventricle has no regional wall motion abnormalities. There is moderate asymmetric left ventricular hypertrophy of the basal and septal segments. Left ventricular diastolic parameters are consistent with Grade I diastolic dysfunction (impaired relaxation).  2. Right ventricular systolic function is normal. The right ventricular size is normal. Tricuspid regurgitation signal is inadequate for assessing PA pressure.  3. Left atrial size was mildly dilated.  4. The mitral valve is abnormal. Trivial mitral valve regurgitation. No evidence of mitral stenosis.  5. The aortic valve is tricuspid. Aortic valve regurgitation is not visualized. Aortic valve sclerosis is present, with no evidence of aortic valve stenosis.  6. Aortic dilatation noted. There is mild dilatation of the ascending aorta, measuring 40 mm. There is mild dilatation of the aortic root, measuring 41 mm. FINDINGS  Left Ventricle: Left ventricular ejection fraction, by estimation, is 60 to 65%. The left ventricle has normal function. The left ventricle has no regional wall motion abnormalities. The left ventricular internal cavity size was normal in size. There is  moderate asymmetric left ventricular hypertrophy of the basal and septal segments. Left  ventricular diastolic parameters are consistent with Grade I diastolic dysfunction (impaired relaxation). Right Ventricle: The right ventricular size is normal. No increase in right ventricular wall thickness. Right ventricular systolic function is normal. Tricuspid regurgitation signal is inadequate for assessing PA pressure. Left Atrium: Left atrial size was mildly dilated. Right Atrium: Right atrial size was normal in size. Pericardium: There is no evidence of pericardial effusion. Mitral Valve: The mitral valve is abnormal. There is mild thickening of the posterior mitral valve leaflet(s). There is mild calcification of the mitral valve leaflet(s). Trivial mitral valve regurgitation. No evidence of mitral valve stenosis. Tricuspid Valve: The tricuspid valve is normal in structure. Tricuspid valve regurgitation is trivial. No evidence of tricuspid stenosis. Aortic Valve: The aortic valve is tricuspid. Aortic valve regurgitation is not visualized. Aortic valve sclerosis is present, with no evidence of aortic valve stenosis. Pulmonic Valve: The pulmonic valve was normal in structure. Pulmonic valve regurgitation is not visualized. No evidence of pulmonic stenosis. Aorta: Aortic dilatation noted. There is mild dilatation of the ascending aorta, measuring 40 mm. There is mild dilatation of the aortic root, measuring 41 mm. IAS/Shunts: No atrial level shunt detected by color flow Doppler. Additional Comments: 3D was performed not requiring image post processing on an independent workstation and was indeterminate.  LEFT VENTRICLE PLAX 2D LVIDd:         4.30 cm     Diastology LVIDs:         2.85 cm     LV e' medial:    5.22 cm/s LV PW:         1.70 cm  LV E/e' medial:  15.5 LV IVS:        1.60 cm     LV e' lateral:   5.98 cm/s LVOT diam:     2.40 cm     LV E/e' lateral: 13.5 LV SV:         123 LV SV Index:   51 LVOT Area:     4.52 cm  LV Volumes (MOD) LV vol d, MOD A2C: 85.6 ml LV vol d, MOD A4C: 94.0 ml LV vol s,  MOD A2C: 31.2 ml LV vol s, MOD A4C: 37.3 ml LV SV MOD A2C:     54.4 ml LV SV MOD A4C:     94.0 ml LV SV MOD BP:      57.4 ml RIGHT VENTRICLE             IVC RV S prime:     14.30 cm/s  IVC diam: 1.70 cm TAPSE (M-mode): 2.5 cm LEFT ATRIUM             Index        RIGHT ATRIUM           Index LA diam:        4.00 cm 1.65 cm/m   RA Area:     12.70 cm LA Vol (A2C):   28.6 ml 11.83 ml/m  RA Volume:   26.20 ml  10.84 ml/m LA Vol (A4C):   16.6 ml 6.87 ml/m LA Biplane Vol: 22.2 ml 9.18 ml/m  AORTIC VALVE LVOT Vmax:   124.00 cm/s LVOT Vmean:  83.400 cm/s LVOT VTI:    0.272 m  AORTA Ao Root diam: 4.10 cm Ao Asc diam:  3.95 cm MITRAL VALVE MV Area (PHT): 3.47 cm    SHUNTS MV Decel Time: 218 msec    Systemic VTI:  0.27 m MV E velocity: 80.82 cm/s  Systemic Diam: 2.40 cm MV A velocity: 87.78 cm/s MV E/A ratio:  0.92 Maude Emmer MD Electronically signed by Maude Emmer MD Signature Date/Time: 02/22/2024/11:14:14 AM    Final         Scheduled Meds:  amLODipine   10 mg Oral Daily   aspirin  EC  81 mg Oral Daily   cefadroxil   1,000 mg Oral BID   docusate sodium   200 mg Oral Daily   enoxaparin  (LOVENOX ) injection  40 mg Subcutaneous QHS   ferrous sulfate   325 mg Oral BID WC   furosemide   80 mg Intravenous BID   hydrALAZINE   100 mg Oral TID   nicotine   21 mg Transdermal Daily   rosuvastatin   20 mg Oral QHS   sacubitril -valsartan   1 tablet Oral BID   spironolactone   25 mg Oral Daily   Continuous Infusions:     LOS: 4 days    Time spent: 48 minutes spent on 02/23/2024 caring for this patient face-to-face including chart review, ordering labs/tests, documenting, discussion with nursing staff, consultants, updating family and interview/physical exam    Camellia PARAS [place], DO [facility] Available via Epic secure chat 7am-7pm After these hours, please refer to coverage provider listed on [email or web address] 02/23/2024, 2:37 PM

## 2024-02-23 NOTE — Progress Notes (Signed)
 Progress Note  Patient Name: Alex [name] Date of Encounter: 02/23/2024  Primary Cardiologist: Stanly DELENA Leavens, MD   Subjective   Dyspnea improving slowly. BP better.   Inpatient Medications    Scheduled Meds:  amLODipine   10 mg Oral Daily   aspirin  EC  81 mg Oral Daily   cefadroxil   1,000 mg Oral BID   docusate sodium   200 mg Oral Daily   enoxaparin  (LOVENOX ) injection  40 mg Subcutaneous QHS   ferrous sulfate   325 mg Oral BID WC   furosemide   80 mg Intravenous BID   hydrALAZINE   100 mg Oral TID   nicotine   21 mg Transdermal Daily   rosuvastatin   20 mg Oral QHS   sacubitril -valsartan   1 tablet Oral BID   spironolactone   25 mg Oral Daily   Continuous Infusions:  PRN Meds: acetaminophen  **OR** acetaminophen , albuterol , hydrALAZINE , melatonin, ondansetron  (ZOFRAN ) IV   Vital Signs    Vitals:   02/22/24 1954 02/23/24 0031 02/23/24 0449 02/23/24 0814  BP: 120/80 (!) 141/78 (!) 148/79 129/83  Pulse: 85 81 70 84  Resp: (!) 22 20 (!) 22 (!) 24  Temp: 98.8 F (37.1 C) 98.5 F (36.9 C) 98 F (36.7 C) 98.1 F (36.7 C)  TempSrc: Oral Oral Oral Oral  SpO2: 94% 94% 97%   Weight:   124.9 kg   Height:        Intake/Output Summary (Last 24 hours) at 02/23/2024 1020 Last data filed at 02/23/2024 0453 Gross per 24 hour  Intake 240 ml  Output 2550 ml  Net -2310 ml   Filed Weights   02/21/24 0416 02/22/24 0414 02/23/24 0449  Weight: 126.9 kg 125.1 kg 124.9 kg    Telemetry    NSR at 78 - Personally Reviewed  ECG    none - Personally Reviewed  Physical Exam   GEN: obese, No acute distress.   Neck: No JVD Cardiac: RRR, no murmurs, rubs, or gallops.  Respiratory: Clear to auscultation bilaterally. GI: Soft, nontender, non-distended  MS: 2+ edema, legs wrapped; No deformity. Neuro:  Nonfocal  Psych: Normal affect   Labs    Chemistry Recent Labs  Lab 02/19/24 0600 02/20/24 0349 02/21/24 0818 02/22/24 0916 02/23/24 0530  NA 136   < > 136 133*  134*  K 3.8   < > 4.4 4.4 3.9  CL 101   < > 98 99 100  CO2 28   < > 26 26 28   GLUCOSE 153*   < > 96 133* 96  BUN 10   < > 9 11 14   CREATININE 0.99   < > 0.87 0.91 0.95  CALCIUM  8.8*   < > 8.9 8.7* 8.7*  PROT 7.4  --  8.1  --   --   ALBUMIN 2.6*  --  2.7*  --   --   AST 17  --  17  --   --   ALT 19  --  14  --   --   ALKPHOS 57  --  56  --   --   BILITOT 0.4  --  0.6  --   --   GFRNONAA >60   < > >60 >60 >60  ANIONGAP 7   < > 12 8 6    < > = values in this interval not displayed.     Hematology Recent Labs  Lab 02/18/24 1931 02/19/24 0608 02/21/24 0818  WBC 10.8* 10.1 8.6  RBC 5.13 4.87 5.26  HGB 13.4  12.8* 13.8  HCT 42.8 40.6 43.2  MCV 83.4 83.4 82.1  MCH 26.1 26.3 26.2  MCHC 31.3 31.5 31.9  RDW 15.7* 15.9* 15.9*  PLT 391 354 334    Cardiac EnzymesNo results for input(s): TROPONINI in the last 168 hours. No results for input(s): TROPIPOC in the last 168 hours.   BNP Recent Labs  Lab 02/18/24 1931 02/20/24 1249  BNP 67.0 62.7     DDimer  Recent Labs  Lab 02/18/24 2243  DDIMER 3.25*     Radiology    ECHOCARDIOGRAM COMPLETE Result Date: 02/22/2024    ECHOCARDIOGRAM REPORT   Patient Name:   Alex [name] Date of Exam: 02/22/2024 Medical Rec #:  [id number]      Height:       71.0 in Accession #:    [id number]     Weight:       275.8 lb Date of Birth:  1971-02-02      BSA:          2.417 m Patient Age:    53 years       BP:           148/76 mmHg Patient Gender: M              HR:           60 bpm. Exam Location:  Inpatient Procedure: 2D Echo, Cardiac Doppler and Color Doppler (Both Spectral and Color            Flow Doppler were utilized during procedure). Indications:    I50.40* Unspecified combined systolic (congestive) and diastolic                 (congestive) heart failure  History:        Patient has prior history of Echocardiogram examinations, most                 recent 11/21/2021. CHF, Abnormal ECG, Arrythmias:PSVT; Risk                 Factors:Current  Smoker and Hypertension. ETOH. Substance abuse.  Sonographer:    Ellouise Mose RDCS Referring Phys: [id number] [facility] A CHANDRASEKHAR IMPRESSIONS  1. Left ventricular ejection fraction, by estimation, is 60 to 65%. The left ventricle has normal function. The left ventricle has no regional wall motion abnormalities. There is moderate asymmetric left ventricular hypertrophy of the basal and septal segments. Left ventricular diastolic parameters are consistent with Grade I diastolic dysfunction (impaired relaxation).  2. Right ventricular systolic function is normal. The right ventricular size is normal. Tricuspid regurgitation signal is inadequate for assessing PA pressure.  3. Left atrial size was mildly dilated.  4. The mitral valve is abnormal. Trivial mitral valve regurgitation. No evidence of mitral stenosis.  5. The aortic valve is tricuspid. Aortic valve regurgitation is not visualized. Aortic valve sclerosis is present, with no evidence of aortic valve stenosis.  6. Aortic dilatation noted. There is mild dilatation of the ascending aorta, measuring 40 mm. There is mild dilatation of the aortic root, measuring 41 mm. FINDINGS  Left Ventricle: Left ventricular ejection fraction, by estimation, is 60 to 65%. The left ventricle has normal function. The left ventricle has no regional wall motion abnormalities. The left ventricular internal cavity size was normal in size. There is  moderate asymmetric left ventricular hypertrophy of the basal and septal segments. Left ventricular diastolic parameters are consistent with Grade I diastolic dysfunction (impaired relaxation). Right Ventricle: The right ventricular size is normal. No  increase in right ventricular wall thickness. Right ventricular systolic function is normal. Tricuspid regurgitation signal is inadequate for assessing PA pressure. Left Atrium: Left atrial size was mildly dilated. Right Atrium: Right atrial size was normal in size. Pericardium: There is no  evidence of pericardial effusion. Mitral Valve: The mitral valve is abnormal. There is mild thickening of the posterior mitral valve leaflet(s). There is mild calcification of the mitral valve leaflet(s). Trivial mitral valve regurgitation. No evidence of mitral valve stenosis. Tricuspid Valve: The tricuspid valve is normal in structure. Tricuspid valve regurgitation is trivial. No evidence of tricuspid stenosis. Aortic Valve: The aortic valve is tricuspid. Aortic valve regurgitation is not visualized. Aortic valve sclerosis is present, with no evidence of aortic valve stenosis. Pulmonic Valve: The pulmonic valve was normal in structure. Pulmonic valve regurgitation is not visualized. No evidence of pulmonic stenosis. Aorta: Aortic dilatation noted. There is mild dilatation of the ascending aorta, measuring 40 mm. There is mild dilatation of the aortic root, measuring 41 mm. IAS/Shunts: No atrial level shunt detected by color flow Doppler. Additional Comments: 3D was performed not requiring image post processing on an independent workstation and was indeterminate.  LEFT VENTRICLE PLAX 2D LVIDd:         4.30 cm     Diastology LVIDs:         2.85 cm     LV e' medial:    5.22 cm/s LV PW:         1.70 cm     LV E/e' medial:  15.5 LV IVS:        1.60 cm     LV e' lateral:   5.98 cm/s LVOT diam:     2.40 cm     LV E/e' lateral: 13.5 LV SV:         123 LV SV Index:   51 LVOT Area:     4.52 cm  LV Volumes (MOD) LV vol d, MOD A2C: 85.6 ml LV vol d, MOD A4C: 94.0 ml LV vol s, MOD A2C: 31.2 ml LV vol s, MOD A4C: 37.3 ml LV SV MOD A2C:     54.4 ml LV SV MOD A4C:     94.0 ml LV SV MOD BP:      57.4 ml RIGHT VENTRICLE             IVC RV S prime:     14.30 cm/s  IVC diam: 1.70 cm TAPSE (M-mode): 2.5 cm LEFT ATRIUM             Index        RIGHT ATRIUM           Index LA diam:        4.00 cm 1.65 cm/m   RA Area:     12.70 cm LA Vol (A2C):   28.6 ml 11.83 ml/m  RA Volume:   26.20 ml  10.84 ml/m LA Vol (A4C):   16.6 ml 6.87  ml/m LA Biplane Vol: 22.2 ml 9.18 ml/m  AORTIC VALVE LVOT Vmax:   124.00 cm/s LVOT Vmean:  83.400 cm/s LVOT VTI:    0.272 m  AORTA Ao Root diam: 4.10 cm Ao Asc diam:  3.95 cm MITRAL VALVE MV Area (PHT): 3.47 cm    SHUNTS MV Decel Time: 218 msec    Systemic VTI:  0.27 m MV E velocity: 80.82 cm/s  Systemic Diam: 2.40 cm MV A velocity: 87.78 cm/s MV E/A ratio:  0.92 Maude Emmer MD Electronically  signed by Maude Emmer MD Signature Date/Time: 02/22/2024/11:14:14 AM    Final     Cardiac Studies   See above  Patient Profile     53 y.o. male admitted with HTN urgency and HFPEF in the setting of morbid obesity.  Assessment & Plan    HTN urgency - his pressures are improved on medical therapy. Admits to some non-compliance.  Acute on chronic diastolic heart failure - his weight down another kg. He is still not quite at his baseline. Continue IV lasix . Obesity - BMI is 38. He will need to work on this.    For questions or updates, please contact [facility] Please consult [email or web address] for contact info under Cardiology/STEMI.      Signed, Danelle Birmingham, MD  02/23/2024, 10:20 AM

## 2024-02-23 NOTE — Progress Notes (Signed)
 Pt placed on overnight sleep study, pt immediately dropped sat to 83% when sleeping and having obvious apnea periods. Placed patient back on [place] at 4LPM, sat 95% with dips to 92% with apnea. This was all within the first 5 minutes of study. Will print and insert complete study in AM

## 2024-02-24 LAB — BASIC METABOLIC PANEL WITH GFR
Anion gap: 4 — ABNORMAL LOW (ref 5–15)
BUN: 17 mg/dL (ref 6–20)
CO2: 27 mmol/L (ref 22–32)
Calcium: 8.4 mg/dL — ABNORMAL LOW (ref 8.9–10.3)
Chloride: 100 mmol/L (ref 98–111)
Creatinine, Ser: 0.97 mg/dL (ref 0.61–1.24)
GFR, Estimated: >60 mL/min (ref >60)
Glucose, Bld: 102 mg/dL — ABNORMAL HIGH (ref 70–99)
Potassium: 4.2 mmol/L (ref 3.5–5.1)
Sodium: 131 mmol/L — ABNORMAL LOW (ref 135–145)

## 2024-02-24 NOTE — Progress Notes (Signed)
 PROGRESS NOTE    Alex [name]  FMW:[id number] DOB: [birth year] DOA: 02/18/2024 PCP: [facility]    Brief Narrative:   Alex [name] is a 53 y.o. male [employer] veteran with past medical history significant for HTN, chronic diastolic congestive heart failure, CAD, COPD, CVA, tobacco use disorder, obesity who presented to [facility] ED on 02/18/2024 with complaints of progressive lower extremity edema.  He reports redness and pain to his lower extremities.  Denies chest pain.  Patient is fairly isolated socially, lives with a roommate who recently underwent surgery.  Has not been compliant with his medications and not following up with the [facility] as scheduled.  In the ED, temperature 98.5 F, HR 80, RR 20, BP 154/82, SpO2 87% on room air and placed on 2 L nasal cannula.  WBC 10.8, hemoglobin 13.4, platelet count 391.  Sodium 136, potassium 4.0, chloride 100, CO2 27, glucose 107, BUN 11, creatinine 0.95.  BNP 67.0.  High-sensitivity troponin 17> 20.  Procalcitonin less than 0.10.  D-dimer elevated 3.25.  EtOH level less than 15.  Chest x-ray with low lung volumes and mid bibasilar atelectasis. Urinalysis showed no signs for infection, but had elevated specific gravity of 1.032. Due to the elevated D-dimer a CT angiogram of the chest have been obtained which showed no evidence of a pulmonary embolism and moderate hepatic steatosis. Blood cultures have been obtained. Patient has been given an DuoNeb breathing treatment. He was noted to have a 22nd episode of V. tach with conversion back into a normal sinus rhythm.   Assessment & Plan:   Acute hypoxic respiratory failure, POA Acute on chronic diastolic congestive heart failure Hypertensive emergency  Hx HTN; medication noncompliance Patient presenting with progressive lower extremity edema in the setting of hypertensive emergency, medication noncompliance and likely dietary indiscretions.  TTE with LVEF 60-65%, LV with no regional wall motion normalities,  moderate asymptomatic LVH, grade 1 diastolic dysfunction, LA mildly dilated, trivial MR, no aortic stenosis -- Cardiology following, appreciate assistance -- Net negative 2.2 L past 24 hours, net  negative 9.5 L since admission -- Wt 129>>124.6kg -- Amlodipine  10 mg p.o. daily -- Hydralazine  100 mg p.o. 3 times daily -- Entresto  97-103 mg p.o. twice daily -- Spironolactone  25 mg p.o. daily -- Furosemide  80 mg IV twice daily -- Continue aspirin  and statin -- Strict I's and O's, daily weights -- BMP daily  Lower extremity cellulitis Patient presenting with progressive lower extremity swelling, erythema.  Concerning for cellulitis given elevated WBC count on admission.  Likely large contributing factor is his CHF as legs above. -- Cefadroxil  1000 mg PO BID to complete 10-day course -- Unna boots  CAD with coronary artery calcifications LAD, circumflex, aorta -- Aspirin  81 mg PO BID -- Crestor  20 mg p.o. daily  Ascending/aortic root dilation TTE with findings of mild dilation of the ascending aorta measuring 40 mm, aortic root measuring 41 mm.  Continue annual surveillance.  COPD Not on any controlling inhalers outpatient. -- Albuterol  neb every 4 hours.  Wheezing/shortness of breath  History of CVA -- Continue aspirin  and statin  Tobacco use disorder Endorses smoking a pack and 1/2 cigarettes/day.  Counseled on need for complete cessation/abstinence. -- Nicotine  patch  Suspected sleep apnea Noted to have episodes of apnea with bradycardia, hypoxia on telemetry while sleeping. + Nocturnal O2 assessment on 6/21; requiring 4 L nasal cannula to maintain oxygenation while sleeping. -- Outpatient sleep study  Obesity, class II Body mass index is 38.31 kg/m.  Weakness/ability/deconditioning/gait disturbance: --  PT/OT recommending home health on discharge, TOC consulted for assistance -- Continue therapy efforts while inpatient   DVT prophylaxis: enoxaparin  (LOVENOX ) injection  40 mg Start: 02/20/24 2200 SCDs Start: 02/19/24 0008    Code Status: Full Code Family Communication: No family present at bedside this morning  Disposition Plan:  Level of care: Progressive Status is: Inpatient Remains inpatient appropriate because: IV diuresis    Consultants:  Cardiology  Procedures:  TTE  Antimicrobials:  Cefazolin  6/17>>   Subjective: Patient seen examined bedside, lying in bed.  Sleeping but he is arousable.  Nocturnal O2 study positive with significant desaturations overnight.  RN reports that patient is noncompliant with oxygen, although pulling nasal cannula off multiple times.  Remains on IV diuresis. Discussed with patient once again will [facility].  Remains on IV diuretic.  No complaints, concerns or questions at this time.  Denies headache, no dizziness, no chest pain, no palpitations, no abdominal pain, no fever/chills/night sweats, no nausea/vomiting/diarrhea, no focal weakness, no fatigue, no paresthesia.  No acute events overnight per nursing.  Objective: Vitals:   02/23/24 1954 02/24/24 0036 02/24/24 0529 02/24/24 0844  BP: 110/71 127/69 125/72 120/76  Pulse: 97 85 76   Resp: 18 20 (!) 22 20  Temp: 98.8 F (37.1 C) 98.4 F (36.9 C) 98.6 F (37 C) 98.1 F (36.7 C)  TempSrc: Oral Oral Oral Oral  SpO2: 100% 93% 95% 94%  Weight:   124.6 kg   Height:        Intake/Output Summary (Last 24 hours) at 02/24/2024 1125 Last data filed at 02/24/2024 0600 Gross per 24 hour  Intake 200 ml  Output 2400 ml  Net -2200 ml   Filed Weights   02/22/24 0414 02/23/24 0449 02/24/24 0529  Weight: 125.1 kg 124.9 kg 124.6 kg    Examination:  Physical Exam: GEN: NAD, alert and oriented x 3, chronically ill appearance, appears older than stated age HEENT: NCAT, PERRL, EOMI, sclera clear, MMM PULM: Breath sounds slight diminished lower bases with crackles, no wheezing, normal respiratory effort, on room air with SpO2  97% at rest CV: RRR w/o M/G/R GI: abd soft, NTND, NABS, no R/G/M MSK: + Bilateral lower extremity peripheral edema with Unna boots in place, moves all extremities independently NEURO: No focal neurological deficit PSYCH: Depressed mood, flat affect Integumentary: No concerning rashes/lesions/wounds noted on exposed skin surfaces    Data Reviewed: I have personally reviewed following labs and imaging studies  CBC: Recent Labs  Lab 02/18/24 1931 02/19/24 0608 02/21/24 0818  WBC 10.8* 10.1 8.6  NEUTROABS 7.9* 7.7 5.9  HGB 13.4 12.8* 13.8  HCT 42.8 40.6 43.2  MCV 83.4 83.4 82.1  PLT 391 354 334   Basic Metabolic Panel: Recent Labs  Lab 02/18/24 2141 02/19/24 0600 02/20/24 0349 02/21/24 0818 02/22/24 0916 02/23/24 0530 02/24/24 0447  NA  --  136 135 136 133* 134* 131*  K  --  3.8 3.9 4.4 4.4 3.9 4.2  CL  --  101 100 98 99 100 100  CO2  --  28 28 26 26 28 27   GLUCOSE  --  153* 103* 96 133* 96 102*  BUN  --  10 10 9 11 14 17   CREATININE  --  0.99 0.93 0.87 0.91 0.95 0.97  CALCIUM   --  8.8* 8.6* 8.9 8.7* 8.7* 8.4*  MG 2.2 2.0  --   --   --   --   --  GFR: Estimated Creatinine Clearance: 117 mL/min (by C-G formula based on SCr of 0.97 mg/dL). Liver Function Tests: Recent Labs  Lab 02/19/24 0600 02/21/24 0818  AST 17 17  ALT 19 14  ALKPHOS 57 56  BILITOT 0.4 0.6  PROT 7.4 8.1  ALBUMIN 2.6* 2.7*   No results for input(s): LIPASE, AMYLASE in the last 168 hours. No results for input(s): AMMONIA in the last 168 hours. Coagulation Profile: No results for input(s): INR, PROTIME in the last 168 hours. Cardiac Enzymes: No results for input(s): CKTOTAL, CKMB, CKMBINDEX, TROPONINI in the last 168 hours. BNP (last 3 results) No results for input(s): PROBNP in the last 8760 hours. HbA1C: No results for input(s): HGBA1C in the last 72 hours. CBG: No results for input(s): GLUCAP in the last 168 hours. Lipid Profile: No results for input(s):  CHOL, HDL, LDLCALC, TRIG, CHOLHDL, LDLDIRECT in the last 72 hours.  Thyroid Function Tests: No results for input(s): TSH, T4TOTAL, FREET4, T3FREE, THYROIDAB in the last 72 hours. Anemia Panel: No results for input(s): VITAMINB12, FOLATE, FERRITIN, TIBC, IRON, RETICCTPCT in the last 72 hours. Sepsis Labs: Recent Labs  Lab 02/18/24 2141 02/20/24 0349  PROCALCITON <0.10 <0.10    Recent Results (from the past 240 hours)  Blood culture (routine x 2)     Status: Abnormal   Collection Time: 02/18/24  8:53 PM   Specimen: BLOOD  Result Value Ref Range Status   Specimen Description BLOOD SITE NOT SPECIFIED  Final   Special Requests   Final    BOTTLES DRAWN AEROBIC AND ANAEROBIC Blood Culture adequate volume   Culture  Setup Time   Final    GRAM POSITIVE COCCI IN CLUSTERS IN BOTH AEROBIC AND ANAEROBIC BOTTLES CRITICAL RESULT CALLED TO, READ BACK BY AND VERIFIED WITH: PHARMD ELIZABETH MARTIN ON 02/20/24 @ 1524 BY DRT    Culture (A)  Final    STAPHYLOCOCCUS COHNII THE SIGNIFICANCE OF ISOLATING THIS ORGANISM FROM A SINGLE SET OF BLOOD CULTURES WHEN MULTIPLE SETS ARE DRAWN IS UNCERTAIN. PLEASE NOTIFY THE MICROBIOLOGY DEPARTMENT WITHIN ONE WEEK IF SPECIATION AND SENSITIVITIES ARE REQUIRED. Performed at [facility], [address] [place], [place] [address]    Report Status 02/22/2024 FINAL  Final  Blood Culture ID Panel (Reflexed)     Status: Abnormal   Collection Time: 02/18/24  8:53 PM  Result Value Ref Range Status   Enterococcus faecalis NOT DETECTED NOT DETECTED Final   Enterococcus Faecium NOT DETECTED NOT DETECTED Final   Listeria monocytogenes NOT DETECTED NOT DETECTED Final   Staphylococcus species DETECTED (A) NOT DETECTED Final    Comment: CRITICAL RESULT CALLED TO, READ BACK BY AND VERIFIED WITH: PHARMD ELIZABETH MARTIN ON 02/20/24 @ 1524 BY DRT    Staphylococcus aureus (BCID) NOT DETECTED NOT DETECTED Final   Staphylococcus epidermidis  NOT DETECTED NOT DETECTED Final   Staphylococcus lugdunensis NOT DETECTED NOT DETECTED Final   Streptococcus species NOT DETECTED NOT DETECTED Final   Streptococcus agalactiae NOT DETECTED NOT DETECTED Final   Streptococcus pneumoniae NOT DETECTED NOT DETECTED Final   Streptococcus pyogenes NOT DETECTED NOT DETECTED Final   A.calcoaceticus-baumannii NOT DETECTED NOT DETECTED Final   Bacteroides fragilis NOT DETECTED NOT DETECTED Final   Enterobacterales NOT DETECTED NOT DETECTED Final   Enterobacter cloacae complex NOT DETECTED NOT DETECTED Final   Escherichia coli NOT DETECTED NOT DETECTED Final   Klebsiella aerogenes NOT DETECTED NOT DETECTED Final   Klebsiella oxytoca NOT DETECTED NOT DETECTED Final   Klebsiella pneumoniae  NOT DETECTED NOT DETECTED Final   Proteus species NOT DETECTED NOT DETECTED Final   Salmonella species NOT DETECTED NOT DETECTED Final   Serratia marcescens NOT DETECTED NOT DETECTED Final   Haemophilus influenzae NOT DETECTED NOT DETECTED Final   Neisseria meningitidis NOT DETECTED NOT DETECTED Final   Pseudomonas aeruginosa NOT DETECTED NOT DETECTED Final   Stenotrophomonas maltophilia NOT DETECTED NOT DETECTED Final   Candida albicans NOT DETECTED NOT DETECTED Final   Candida auris NOT DETECTED NOT DETECTED Final   Candida glabrata NOT DETECTED NOT DETECTED Final   Candida krusei NOT DETECTED NOT DETECTED Final   Candida parapsilosis NOT DETECTED NOT DETECTED Final   Candida tropicalis NOT DETECTED NOT DETECTED Final   Cryptococcus neoformans/gattii NOT DETECTED NOT DETECTED Final    Comment: Performed at [facility], [address] [place], [place] [address]  Resp panel by RT-PCR (RSV, Flu A&B, Covid) Anterior Nasal Swab     Status: None   Collection Time: 02/19/24  8:22 AM   Specimen: Anterior Nasal Swab  Result Value Ref Range Status   SARS Coronavirus 2 by RT PCR NEGATIVE NEGATIVE Final   Influenza A by PCR NEGATIVE NEGATIVE Final   Influenza B  by PCR NEGATIVE NEGATIVE Final    Comment: (NOTE) The Xpert Xpress SARS-CoV-2/FLU/RSV plus assay is intended as an aid in the diagnosis of influenza from Nasopharyngeal swab specimens and should not be used as a sole basis for treatment. Nasal washings and aspirates are unacceptable for Xpert Xpress SARS-CoV-2/FLU/RSV testing.  Fact Sheet for Patients: BloggerCourse.com  Fact Sheet for Healthcare Providers: SeriousBroker.it  This test is not yet approved or cleared by the [place]  FDA and has been authorized for detection and/or diagnosis of SARS-CoV-2 by FDA under an Emergency Use Authorization (EUA). This EUA will remain in effect (meaning this test can be used) for the duration of the COVID-19 declaration under Section 564(b)(1) of the Act, 21 U.S.C. section 360bbb-3(b)(1), unless the authorization is terminated or revoked.     Resp Syncytial Virus by PCR NEGATIVE NEGATIVE Final    Comment: (NOTE) Fact Sheet for Patients: BloggerCourse.com  Fact Sheet for Healthcare Providers: SeriousBroker.it  This test is not yet approved or cleared by the [place]  FDA and has been authorized for detection and/or diagnosis of SARS-CoV-2 by FDA under an Emergency Use Authorization (EUA). This EUA will remain in effect (meaning this test can be used) for the duration of the COVID-19 declaration under Section 564(b)(1) of the Act, 21 U.S.C. section 360bbb-3(b)(1), unless the authorization is terminated or revoked.  Performed at [facility], [address], [place], [place] [address]          Radiology Studies: No results found.       Scheduled Meds:  amLODipine   10 mg Oral Daily   aspirin  EC  81 mg Oral Daily   cefadroxil   1,000 mg Oral BID   docusate sodium   200 mg Oral Daily   enoxaparin  (LOVENOX ) injection  40 mg Subcutaneous QHS   ferrous sulfate   325  mg Oral BID WC   furosemide   80 mg Intravenous BID   hydrALAZINE   100 mg Oral TID   nicotine   21 mg Transdermal Daily   rosuvastatin   20 mg Oral QHS   sacubitril -valsartan   1 tablet Oral BID   spironolactone   25 mg Oral Daily   Continuous Infusions:     LOS: 5 days    Time spent: 48 minutes spent on 02/24/2024  caring for this patient face-to-face including chart review, ordering labs/tests, documenting, discussion with nursing staff, consultants, updating family and interview/physical exam    Camellia PARAS [place], DO [facility] Available via Epic secure chat 7am-7pm After these hours, please refer to coverage provider listed on [email or web address] 02/24/2024, 11:25 AM

## 2024-02-24 NOTE — Progress Notes (Signed)
 Progress Note  Patient Name: Alex [name] Date of Encounter: 02/24/2024  Primary Cardiologist: Stanly DELENA Leavens, MD   Subjective   Dyspnea nearing baseline.   Inpatient Medications    Scheduled Meds:  amLODipine   10 mg Oral Daily   aspirin  EC  81 mg Oral Daily   cefadroxil   1,000 mg Oral BID   docusate sodium   200 mg Oral Daily   enoxaparin  (LOVENOX ) injection  40 mg Subcutaneous QHS   ferrous sulfate   325 mg Oral BID WC   furosemide   80 mg Intravenous BID   hydrALAZINE   100 mg Oral TID   nicotine   21 mg Transdermal Daily   rosuvastatin   20 mg Oral QHS   sacubitril -valsartan   1 tablet Oral BID   spironolactone   25 mg Oral Daily   Continuous Infusions:  PRN Meds: acetaminophen  **OR** acetaminophen , albuterol , hydrALAZINE , melatonin, ondansetron  (ZOFRAN ) IV   Vital Signs    Vitals:   02/23/24 1954 02/24/24 0036 02/24/24 0529 02/24/24 0844  BP: 110/71 127/69 125/72 120/76  Pulse: 97 85 76   Resp: 18 20 (!) 22 20  Temp: 98.8 F (37.1 C) 98.4 F (36.9 C) 98.6 F (37 C) 98.1 F (36.7 C)  TempSrc: Oral Oral Oral Oral  SpO2: 100% 93% 95% 94%  Weight:   124.6 kg   Height:        Intake/Output Summary (Last 24 hours) at 02/24/2024 1122 Last data filed at 02/24/2024 0600 Gross per 24 hour  Intake 200 ml  Output 2400 ml  Net -2200 ml   Filed Weights   02/22/24 0414 02/23/24 0449 02/24/24 0529  Weight: 125.1 kg 124.9 kg 124.6 kg    Telemetry    Nsr at 95/min - Personally Reviewed  ECG    none - Personally Reviewed  Physical Exam  Obese, NAD   Neck: No JVD Cardiac: RRR, no murmurs, rubs, or gallops.  Respiratory: Clear to auscultation bilaterally. GI: Soft, nontender, non-distended  MS: No edema; No deformity. Neuro:  Nonfocal  Psych: Normal affect   Labs    Chemistry Recent Labs  Lab 02/19/24 0600 02/20/24 0349 02/21/24 0818 02/22/24 0916 02/23/24 0530 02/24/24 0447  NA 136   < > 136 133* 134* 131*  K 3.8   < > 4.4 4.4 3.9 4.2   CL 101   < > 98 99 100 100  CO2 28   < > 26 26 28 27   GLUCOSE 153*   < > 96 133* 96 102*  BUN 10   < > 9 11 14 17   CREATININE 0.99   < > 0.87 0.91 0.95 0.97  CALCIUM  8.8*   < > 8.9 8.7* 8.7* 8.4*  PROT 7.4  --  8.1  --   --   --   ALBUMIN 2.6*  --  2.7*  --   --   --   AST 17  --  17  --   --   --   ALT 19  --  14  --   --   --   ALKPHOS 57  --  56  --   --   --   BILITOT 0.4  --  0.6  --   --   --   GFRNONAA >60   < > >60 >60 >60 >60  ANIONGAP 7   < > 12 8 6  4*   < > = values in this interval not displayed.     Hematology Recent Labs  Lab 02/18/24  1931 02/19/24 0608 02/21/24 0818  WBC 10.8* 10.1 8.6  RBC 5.13 4.87 5.26  HGB 13.4 12.8* 13.8  HCT 42.8 40.6 43.2  MCV 83.4 83.4 82.1  MCH 26.1 26.3 26.2  MCHC 31.3 31.5 31.9  RDW 15.7* 15.9* 15.9*  PLT 391 354 334    Cardiac EnzymesNo results for input(s): TROPONINI in the last 168 hours. No results for input(s): TROPIPOC in the last 168 hours.   BNP Recent Labs  Lab 02/18/24 1931 02/20/24 1249  BNP 67.0 62.7     DDimer  Recent Labs  Lab 02/18/24 2243  DDIMER 3.25*     Radiology    No results found.  Cardiac Studies   none  Patient Profile     53 y.o. male admitted with acute on chronic HFPEF and HTN urgency in the setting of obesity.   Assessment & Plan    HTN urgency - his bp's are much better. Ok for DC from my perspective. Continue current meds and switch to lasix  80 mg daily.  HFPEF - his weight is unchanged as is his renal function. Probably pretty close to baseline.  [facility] will sign off.   The patient is ready for discharge today from a cardiac standpoint. Medication Recommendations:  continue current Other recommendations (labs, testing, etc):  bmp in 1 week Follow up as an outpatient:  in [facility] impact clinc gen cards app and then with Dr. REY  For questions or updates, please contact [facility] Please consult [email or web address] for contact info under Cardiology/STEMI.       Signed, Danelle Birmingham, MD  02/24/2024, 11:22 AM

## 2024-02-24 NOTE — Progress Notes (Signed)
 Overnight pulse oximetry study completed, printed and placed in pt shadow chart at nursing station.

## 2024-02-25 ENCOUNTER — Other Ambulatory Visit (HOSPITAL_COMMUNITY): Payer: Self-pay

## 2024-02-25 LAB — BASIC METABOLIC PANEL WITH GFR
Anion gap: 10 (ref 5–15)
BUN: 18 mg/dL (ref 6–20)
CO2: 26 mmol/L (ref 22–32)
Calcium: 9 mg/dL (ref 8.9–10.3)
Chloride: 97 mmol/L — ABNORMAL LOW (ref 98–111)
Creatinine, Ser: 1 mg/dL (ref 0.61–1.24)
GFR, Estimated: >60 mL/min (ref >60)
Glucose, Bld: 96 mg/dL (ref 70–99)
Potassium: 4.3 mmol/L (ref 3.5–5.1)
Sodium: 133 mmol/L — ABNORMAL LOW (ref 135–145)

## 2024-02-25 MED ORDER — CEFADROXIL 500 MG PO CAPS
1000.0000 mg | ORAL_CAPSULE | Freq: Two times a day (BID) | ORAL | 0 refills | Status: AC
  Filled 2024-02-25: qty 18, 5d supply, fill #0

## 2024-02-25 MED ORDER — SPIRONOLACTONE 25 MG PO TABS
25.0000 mg | ORAL_TABLET | Freq: Every day | ORAL | 0 refills | Status: AC
  Filled 2024-02-25: qty 90, 90d supply, fill #0
  Filled 2024-02-25: qty 30, 30d supply, fill #0

## 2024-02-25 MED ORDER — HYDRALAZINE HCL 100 MG PO TABS
100.0000 mg | ORAL_TABLET | Freq: Three times a day (TID) | ORAL | 0 refills | Status: AC
  Filled 2024-02-25: qty 90, 30d supply, fill #0
  Filled 2024-02-25: qty 270, 90d supply, fill #0

## 2024-02-25 MED ORDER — ALBUTEROL SULFATE HFA 108 (90 BASE) MCG/ACT IN AERS
2.0000 | INHALATION_SPRAY | Freq: Four times a day (QID) | RESPIRATORY_TRACT | 0 refills | Status: AC | PRN
  Filled 2024-02-25: qty 6.7, 25d supply, fill #0

## 2024-02-25 MED ORDER — DOCUSATE SODIUM 100 MG PO CAPS
200.0000 mg | ORAL_CAPSULE | Freq: Every day | ORAL | 0 refills | Status: AC
  Filled 2024-02-25: qty 180, 90d supply, fill #0

## 2024-02-25 MED ORDER — AMLODIPINE BESYLATE 10 MG PO TABS
10.0000 mg | ORAL_TABLET | Freq: Every day | ORAL | 0 refills | Status: AC
  Filled 2024-02-25: qty 30, 30d supply, fill #0
  Filled 2024-02-25: qty 90, 90d supply, fill #0

## 2024-02-25 MED ORDER — SACUBITRIL-VALSARTAN 97-103 MG PO TABS
1.0000 | ORAL_TABLET | Freq: Two times a day (BID) | ORAL | 0 refills | Status: AC
  Filled 2024-02-25: qty 60, 30d supply, fill #0

## 2024-02-25 MED ORDER — FERROUS SULFATE 325 (65 FE) MG PO TABS
325.0000 mg | ORAL_TABLET | Freq: Two times a day (BID) | ORAL | 0 refills | Status: AC
  Filled 2024-02-25: qty 180, 90d supply, fill #0

## 2024-02-25 MED ORDER — ASPIRIN 81 MG PO TBEC
81.0000 mg | DELAYED_RELEASE_TABLET | Freq: Every day | ORAL | 0 refills | Status: AC
  Filled 2024-02-25: qty 90, 90d supply, fill #0

## 2024-02-25 MED ORDER — FUROSEMIDE 80 MG PO TABS
80.0000 mg | ORAL_TABLET | Freq: Every day | ORAL | 0 refills | Status: AC
  Filled 2024-02-25: qty 30, 30d supply, fill #0
  Filled 2024-02-25: qty 90, 90d supply, fill #0

## 2024-02-25 MED ORDER — ROSUVASTATIN CALCIUM 20 MG PO TABS
20.0000 mg | ORAL_TABLET | Freq: Every day | ORAL | 0 refills | Status: AC
  Filled 2024-02-25: qty 90, 90d supply, fill #0
  Filled 2024-02-25: qty 30, 30d supply, fill #0

## 2024-02-25 NOTE — Progress Notes (Signed)
 Physical Therapy Treatment Patient Details Name: Alex [name] MRN: [id number] DOB: 09/10/1970 Today's Date: 02/25/2024   History of Present Illness Pt is a 53 y.o. male presenting 02/18/24 with BLE pitting edema, generalized weakness, and weeping wounds. PMH significant for HTN, CVA, HTN, MI, COPD, medication non-compliance.    PT Comments  Pt waiting on discharge. 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 Pt verbalizes understanding and expresses need for job but that he knows he should be looking for something more sustainable. Pt is grossly independent for all mobility.     If plan is discharge home, recommend the following: Help with stairs or ramp for entrance;Assist for transportation;Direct supervision/assist for medications management;A little help with bathing/dressing/bathroom   Can travel by private vehicle      Yes  Equipment Recommendations  Other (comment) (tub bench)       Precautions / Restrictions Precautions Precautions: Fall Recall of Precautions/Restrictions: Intact Restrictions Weight Bearing Restrictions Per Provider Order: No     Mobility  Bed Mobility               General bed mobility comments: sitting EoB    Transfers Overall transfer level: Needs assistance Equipment used: None Transfers: Sit to/from Stand, Bed to chair/wheelchair/BSC Sit to Stand: Independent           General transfer comment: ablet to stand up from bed without assist    Ambulation/Gait Ambulation/Gait assistance: Independent Gait Distance (Feet): 15 Feet Assistive device: None Gait Pattern/deviations: Step-through pattern, Decreased stride length, Wide base of support       General Gait Details: independent ambulation around room         Balance Overall balance assessment: Mild deficits observed, not formally tested                                           Communication Communication Communication: No apparent difficulties  Cognition Arousal: Alert Behavior During Therapy: WFL for tasks assessed/performed   PT - Cognitive impairments: No apparent impairments                         Following commands: Intact      Cueing Cueing Techniques: Verbal cues     General Comments General comments (skin integrity, edema, etc.): provided pt with education on need for monitoring weight change, trying to stay out of the heat and for taking care of his LE wounds      Pertinent Vitals/Pain Pain Assessment Pain Assessment: No/denies pain     PT Goals (current goals can now be found in the care plan section) Acute Rehab PT Goals Patient Stated Goal: Return Home PT Goal Formulation: With patient Time For Goal Achievement: 03/04/24 Potential to Achieve Goals: Good Progress towards PT goals: Progressing toward goals    Frequency    Min 1X/week       AM-PAC PT 6 Clicks Mobility   Outcome Measure  Help needed turning from your back to your side while in a flat bed without using bedrails?: None Help needed moving from lying on your back to sitting on the side of a flat bed without using bedrails?: None Help needed moving to and from a bed to a chair (including a wheelchair)?: A Little Help needed standing up from a  chair using your arms (e.g., wheelchair or bedside chair)?: A Little Help needed to walk in hospital room?: A Little Help needed climbing 3-5 steps with a railing? : A Little 6 Click Score: 20    End of Session   Activity Tolerance: Patient tolerated treatment well Patient left: Other (comment) (getting ready for discharge) Nurse Communication: Mobility status PT Visit Diagnosis: Other abnormalities of gait and mobility (R26.89);Muscle weakness (generalized) (M62.81);Difficulty in walking, not elsewhere classified (R26.2);Pain Pain - Right/Left:  (bil) Pain - part of body: Leg      Time: 1650-1703 PT Time Calculation (min) (ACUTE ONLY): 13 min  Charges:    $Self Care/Home Management: 8-22 PT General Charges $$ ACUTE PT VISIT: 1 Visit                     Claryce Friel B. Fleeta Lapidus PT, DPT Acute Rehabilitation Services Please use secure chat or  Call Office [phone]    [name] 02/25/2024, 5:29 PM

## 2024-02-25 NOTE — Discharge Summary (Signed)
 Physician Discharge Summary  Alex [name] FMW:[id number] DOB: Aug 19, 1971 DOA: 02/18/2024  PCP: [facility]  Admit date: 02/18/2024 Discharge date: 02/25/2024  Admitted From: Home Disposition: Home  Recommendations for Outpatient Follow-up:  Follow up with PCP in 1-2 weeks Follow-up with cardiology 1 week with repeat labs [facility] and referral for sleep study for high suspicion of obstructive sleep apnea and would benefit from CPAP Continue annual surveillance of aortic root/ascending aorta dilation Please obtain BMP/CBC in one week  Home Health: PT/OT/RN/aide Equipment/Devices: Oxygen 4 L nocturnally for obstructive sleep apnea, tub bench  Discharge Condition: Stable CODE STATUS: Full code Diet recommendation: Heart healthy diet  History of present illness:  Alex [name] is a 53 y.o. male [employer] veteran with past medical history significant for HTN, chronic diastolic congestive heart failure, CAD, COPD, CVA, tobacco use disorder, obesity who presented to [facility] ED on 02/18/2024 with complaints of progressive lower extremity edema.  He reports redness and pain to his lower extremities.  Denies chest pain.  Patient is fairly isolated socially, lives with a roommate who recently underwent surgery.  Has not been compliant with his medications and not following up with the [place] as scheduled.   In the ED, temperature 98.5 F, HR 80, RR 20, BP 154/82, SpO2 87% on room air and placed on 2 L nasal cannula.  WBC 10.8, hemoglobin 13.4, platelet count 391.  Sodium 136, potassium 4.0, chloride 100, CO2 27, glucose 107, BUN 11, creatinine 0.95.  BNP 67.0.  High-sensitivity troponin 17> 20.  Procalcitonin less than 0.10.  D-dimer elevated 3.25.  EtOH level less than 15.  Chest x-ray with low lung volumes and mid bibasilar atelectasis. Urinalysis showed no signs for infection, but had elevated specific gravity of 1.032. Due to the  elevated D-dimer a CT angiogram of the chest have been obtained which showed no evidence of a pulmonary embolism and moderate hepatic steatosis. Blood cultures have been obtained. Patient has been given an DuoNeb breathing treatment. He was noted to have a 22nd episode of V. tach with conversion back into a normal sinus rhythm.   Hospital course:  Acute hypoxic respiratory failure, POA Acute on chronic diastolic congestive heart failure Hypertensive emergency  Hx HTN; medication noncompliance Patient presenting with progressive lower extremity edema in the setting of hypertensive emergency, medication noncompliance and likely dietary indiscretions.  TTE with LVEF 60-65%, LV with no regional wall motion normalities, moderate asymptomatic LVH, grade 1 diastolic dysfunction, LA mildly dilated, trivial MR, no aortic stenosis.  Cardiology was consulted and following hospital course.  Patient was on amlodipine  10 mg p.o. daily, hydralazine  100 mg p.o. 3 times daily, Entresto  97-103 mg p.o. twice daily, spironolactone  25 mg p.o. daily.  Patient received IV diuresis with furosemide  and will discharge on furosemide  80 mg p.o. daily.  Continue aspirin  and statin.  Outpatient follow-up with cardiology 1 week with repeat labs.  Patient's weight on discharge 124.9 kg.   Lower extremity cellulitis Patient presenting with progressive lower extremity swelling, erythema.  Concerning for cellulitis given elevated WBC count on admission.  Likely large contributing factor is his CHF as legs above. Cefadroxil  1000 mg PO BID to complete 10-day course.  Outpatient follow-up PCP.   CAD with coronary artery calcifications LAD, circumflex, aorta Aspirin  81 mg PO daily, Crestor  20 mg p.o. daily   Ascending/aortic root dilation TTE with findings of mild dilation of the ascending aorta measuring 40 mm, aortic root measuring 41  mm.  Continue annual surveillance.   COPD Not on any controlling inhalers outpatient.  Albuterol   MDI as needed wheezing/shortness of breath.   History of CVA Continue aspirin  and statin   Tobacco use disorder Endorses smoking a pack and 1/2 cigarettes/day.  Counseled on need for complete cessation/abstinence.   Suspected sleep apnea Noted to have episodes of apnea with bradycardia, hypoxia on telemetry while sleeping. + Nocturnal O2 assessment on 6/21; requiring 4 L nasal cannula to maintain oxygenation while sleeping.  Needs referral for outpatient sleep study   Obesity, class II Body mass index is 38.31 kg/m.   Weakness/ability/deconditioning/gait disturbance: PT/OT recommending home health on discharge  Discharge Diagnoses:  Principal Problem:   Generalized weakness Active Problems:   Acute respiratory failure with hypoxia (HCC)   Leukocytosis   Hypertensive urgency   Cellulitis   Bilateral lower extremity edema   Acute on chronic diastolic CHF (congestive heart failure) (HCC)   PSVT (paroxysmal supraventricular tachycardia) (HCC)   Normocytic anemia   Depression   Tobacco abuse    Discharge Instructions  Discharge Instructions     Call MD for:  difficulty breathing, headache or visual disturbances   Complete by: As directed    Call MD for:  extreme fatigue   Complete by: As directed    Call MD for:  persistant dizziness or light-headedness   Complete by: As directed    Call MD for:  persistant nausea and vomiting   Complete by: As directed    Call MD for:  severe uncontrolled pain   Complete by: As directed    Call MD for:  temperature >100.4   Complete by: As directed    Diet - low sodium heart healthy   Complete by: As directed    Increase activity slowly   Complete by: As directed       Allergies as of 02/25/2024   No Known Allergies      Medication List     STOP taking these medications    hydrochlorothiazide  25 MG tablet Commonly known as: HYDRODIURIL    lisinopril  10 MG tablet Commonly known as: ZESTRIL    Tiotropium Bromide   Monohydrate 2.5 MCG/ACT Aers       TAKE these medications    Acetaminophen  Extra Strength 500 MG Tabs Take 2 tablets (1,000 mg total) by mouth every 6 (six) hours for 4 days then as directed by MD   albuterol  108 (90 Base) MCG/ACT inhaler Commonly known as: VENTOLIN  HFA Inhale 2 puffs into the lungs every 6 (six) hours as needed for wheezing or shortness of breath.   amLODipine  10 MG tablet Commonly known as: NORVASC  Take 1 tablet (10 mg total) by mouth daily.   aspirin  EC 81 MG tablet Take 1 tablet (81 mg total) by mouth daily. Swallow whole.   cefadroxil  500 MG capsule Commonly known as: DURICEF Take 2 capsules (1,000 mg total) by mouth 2 (two) times daily for 9 doses.   docusate sodium  100 MG capsule Commonly known as: COLACE Take 2 capsules (200 mg total) by mouth daily.   ferrous sulfate  325 (65 FE) MG tablet Take 1 tablet (325 mg total) by mouth 2 (two) times daily with a meal.   furosemide  80 MG tablet Commonly known as: Lasix  Take 1 tablet (80 mg total) by mouth daily.   hydrALAZINE  100 MG tablet Commonly known as: APRESOLINE  Take 1 tablet (100 mg total) by mouth 3 (three) times daily. What changed:  medication strength how much to take  rosuvastatin  20 MG tablet Commonly known as: CRESTOR  Take 1 tablet (20 mg total) by mouth at bedtime. What changed:  how much to take when to take this   sacubitril -valsartan  97-103 MG Commonly known as: ENTRESTO  Take 1 tablet by mouth 2 (two) times daily.   spironolactone  25 MG tablet Commonly known as: ALDACTONE  Take 1 tablet (25 mg total) by mouth daily.               Durable Medical Equipment  (From admission, onward)           Start     Ordered   02/24/24 1114  For home use only DME oxygen  Once       Question Answer Comment  Length of Need Lifetime   Mode or (Route) Nasal cannula   Liters per Minute 4   Frequency Only at night (stationary unit needed)   Oxygen delivery system Gas       02/24/24 1113   02/23/24 0749  For home use only DME Tub bench  Once        02/23/24 0748            Follow-up Information     [facility] Follow up.   Why: Registered Nurse/Physical and Occupational Therapy-office to call with visit times. Contact information: [address] [place] [address] [phone]         [facility].   Why: Set up a appointment with your [facility] team, need a referral for Sleep apnea workup Contact information: [address] [facility] [place] [address] [phone]         [facility] A Dept of [employer] [facility] [employer]. Schedule an appointment as soon as possible for a visit in 1 week(s).   Specialty: Cardiology Contact information: [address] [place]  [address] [phone]               No Known Allergies  Consultations: Cardiology   Procedures/Studies: ECHOCARDIOGRAM COMPLETE Result Date: 02/22/2024    ECHOCARDIOGRAM REPORT   Patient Name:   Alex [name] Date of Exam: 02/22/2024 Medical Rec #:  [id number]      Height:       71.0 in Accession #:    [id number]     Weight:       275.8 lb Date of Birth:  Aug 08, 1971      BSA:          2.417 m Patient Age:    53 years       BP:           148/76 mmHg Patient Gender: M              HR:           60 bpm. Exam Location:  Inpatient Procedure: 2D Echo, Cardiac Doppler and Color Doppler (Both Spectral and Color            Flow Doppler were utilized during procedure). Indications:    I50.40* Unspecified combined systolic (congestive) and diastolic                 (congestive) heart failure  History:        Patient has prior history of Echocardiogram examinations, most                 recent 11/21/2021. CHF, Abnormal ECG, Arrythmias:PSVT; Risk  Factors:Current Smoker and Hypertension. ETOH. Substance abuse.  Sonographer:    Ellouise Mose RDCS Referring Phys: [id number] [facility] CHANDRASEKHAR IMPRESSIONS   1. Left ventricular ejection fraction, by estimation, is 60 to 65%. The left ventricle has normal function. The left ventricle has no regional wall motion abnormalities. There is moderate asymmetric left ventricular hypertrophy of the basal and septal segments. Left ventricular diastolic parameters are consistent with Grade I diastolic dysfunction (impaired relaxation).  2. Right ventricular systolic function is normal. The right ventricular size is normal. Tricuspid regurgitation signal is inadequate for assessing PA pressure.  3. Left atrial size was mildly dilated.  4. The mitral valve is abnormal. Trivial mitral valve regurgitation. No evidence of mitral stenosis.  5. The aortic valve is tricuspid. Aortic valve regurgitation is not visualized. Aortic valve sclerosis is present, with no evidence of aortic valve stenosis.  6. Aortic dilatation noted. There is mild dilatation of the ascending aorta, measuring 40 mm. There is mild dilatation of the aortic root, measuring 41 mm. FINDINGS  Left Ventricle: Left ventricular ejection fraction, by estimation, is 60 to 65%. The left ventricle has normal function. The left ventricle has no regional wall motion abnormalities. The left ventricular internal cavity size was normal in size. There is  moderate asymmetric left ventricular hypertrophy of the basal and septal segments. Left ventricular diastolic parameters are consistent with Grade I diastolic dysfunction (impaired relaxation). Right Ventricle: The right ventricular size is normal. No increase in right ventricular wall thickness. Right ventricular systolic function is normal. Tricuspid regurgitation signal is inadequate for assessing PA pressure. Left Atrium: Left atrial size was mildly dilated. Right Atrium: Right atrial size was normal in size. Pericardium: There is no evidence of pericardial effusion. Mitral Valve: The mitral valve is abnormal. There is mild thickening of the posterior mitral valve leaflet(s).  There is mild calcification of the mitral valve leaflet(s). Trivial mitral valve regurgitation. No evidence of mitral valve stenosis. Tricuspid Valve: The tricuspid valve is normal in structure. Tricuspid valve regurgitation is trivial. No evidence of tricuspid stenosis. Aortic Valve: The aortic valve is tricuspid. Aortic valve regurgitation is not visualized. Aortic valve sclerosis is present, with no evidence of aortic valve stenosis. Pulmonic Valve: The pulmonic valve was normal in structure. Pulmonic valve regurgitation is not visualized. No evidence of pulmonic stenosis. Aorta: Aortic dilatation noted. There is mild dilatation of the ascending aorta, measuring 40 mm. There is mild dilatation of the aortic root, measuring 41 mm. IAS/Shunts: No atrial level shunt detected by color flow Doppler. Additional Comments: 3D was performed not requiring image post processing on an independent workstation and was indeterminate.  LEFT VENTRICLE PLAX 2D LVIDd:         4.30 cm     Diastology LVIDs:         2.85 cm     LV e' medial:    5.22 cm/s LV PW:         1.70 cm     LV E/e' medial:  15.5 LV IVS:        1.60 cm     LV e' lateral:   5.98 cm/s LVOT diam:     2.40 cm     LV E/e' lateral: 13.5 LV SV:         123 LV SV Index:   51 LVOT Area:     4.52 cm  LV Volumes (MOD) LV vol d, MOD A2C: 85.6 ml LV vol d, MOD A4C: 94.0 ml LV vol s, MOD  A2C: 31.2 ml LV vol s, MOD A4C: 37.3 ml LV SV MOD A2C:     54.4 ml LV SV MOD A4C:     94.0 ml LV SV MOD BP:      57.4 ml RIGHT VENTRICLE             IVC RV S prime:     14.30 cm/s  IVC diam: 1.70 cm TAPSE (M-mode): 2.5 cm LEFT ATRIUM             Index        RIGHT ATRIUM           Index LA diam:        4.00 cm 1.65 cm/m   RA Area:     12.70 cm LA Vol (A2C):   28.6 ml 11.83 ml/m  RA Volume:   26.20 ml  10.84 ml/m LA Vol (A4C):   16.6 ml 6.87 ml/m LA Biplane Vol: 22.2 ml 9.18 ml/m  AORTIC VALVE LVOT Vmax:   124.00 cm/s LVOT Vmean:  83.400 cm/s LVOT VTI:    0.272 m  AORTA Ao Root diam:  4.10 cm Ao Asc diam:  3.95 cm MITRAL VALVE MV Area (PHT): 3.47 cm    SHUNTS MV Decel Time: 218 msec    Systemic VTI:  0.27 m MV E velocity: 80.82 cm/s  Systemic Diam: 2.40 cm MV A velocity: 87.78 cm/s MV E/A ratio:  0.92 Maude Emmer MD Electronically signed by Maude Emmer MD Signature Date/Time: 02/22/2024/11:14:14 AM    Final    CT Angio Chest PE W and/or Wo Contrast Result Date: 02/19/2024 EXAM: CTA of the Chest with contrast for PE 02/19/2024 12:54:21 AM TECHNIQUE: CTA of the chest was performed after the administration of intravenous contrast. Multiplanar reformatted images are provided for review. MIP images are provided for review. Automated exposure control, iterative reconstruction, and/or weight based adjustment of the mA/kV was utilized to reduce the radiation dose to as low as reasonably achievable. COMPARISON: Chest radiograph dated 02/18/2024. CLINICAL HISTORY: Pulmonary embolism (PE) suspected, high prob. SOB. FINDINGS: PULMONARY ARTERIES: Pulmonary arteries are adequately opacified for evaluation. No evidence of pulmonary embolism. MEDIASTINUM: The heart and pericardium demonstrate no acute abnormality. Although not tailored for evaluation of the thoracic aorta, there is no evidence of thoracic aneurysm or dissection. Mild thoracic aortic atherosclerosis. Moderate 3-vessel coronary atherosclerosis. LYMPH NODES: No mediastinal, hilar or axillary lymphadenopathy. LUNGS AND PLEURA: Mild centrilobular and paraseptal emphysematous changes, upper lung predominant. Calcified granuloma in the anterior left upper lobe (image 61), benign. Mild subpleural scarring in the bilateral lower lobes, likely postinfectious/inflammatory. No pleural effusion or pneumothorax. UPPER ABDOMEN: Moderate hepatic steatosis. SOFT TISSUES AND BONES: No acute bone or soft tissue abnormality. IMPRESSION: 1. No evidence of pulmonary embolism. 2. Negative CT chest. 3. Moderate hepatic steatosis. Electronically signed by: Pinkie Pebbles MD 02/19/2024 12:59 AM EDT RP Workstation: [id number]   DG Chest Port 1 View Result Date: 02/18/2024 CLINICAL DATA:  Chest pain. EXAM: PORTABLE CHEST 1 VIEW COMPARISON:  May 21, 2023 FINDINGS: The heart size and mediastinal contours are within normal limits. Low lung volumes are noted. Mild atelectasis is suspected within the bilateral lung bases. No pleural effusion or pneumothorax is identified. The visualized skeletal structures are unremarkable. IMPRESSION: Low lung volumes with mild bibasilar atelectasis. Electronically Signed   By: Suzen Dials M.D.   On: 02/18/2024 20:36     Subjective: Patient seen examined bedside, sitting at edge of bed.  Eating breakfast.  No complaints  this morning.  Cardiology now signed off and recommend outpatient follow-up.  Discussed with patient needs to comply with his medication regimen, needs close follow-up with cardiology and PCP.  Also needs outpatient sleep study for suspected sleep apnea.  Patient appreciative of the care he received in the hospital.  No other questions or concerns at this time.  Denies headache, no visual changes, no chest pain, no palpitations, no shortness of breath, no abdominal pain, no fever/chills/night sweats, no nausea/vomiting/diarrhea, no focal weakness, no fatigue, no paresthesia.  No acute events overnight per nursing staff.  Discharge Exam: Vitals:   02/25/24 0834 02/25/24 0845  BP: 134/84 134/84  Pulse:    Resp: 20   Temp: 97.7 F (36.5 C)   SpO2: 98%    Vitals:   02/24/24 2356 02/25/24 0418 02/25/24 0834 02/25/24 0845  BP: 112/74 107/76 134/84 134/84  Pulse: 84 93    Resp: 18 18 20    Temp: 99 F (37.2 C) 97.7 F (36.5 C) 97.7 F (36.5 C)   TempSrc: Oral Oral Oral   SpO2: 97% 97% 98%   Weight:  124.9 kg    Height:        Physical Exam: GEN: NAD, alert and oriented x 3, chronically ill appearance, appears older than stated age HEENT: NCAT, PERRL, EOMI, sclera clear, MMM PULM: Breath  sounds slight diminished lower bases with crackles, no wheezing, normal respiratory effort, on room air with SpO2 98% at rest CV: RRR w/o M/G/R GI: abd soft, NTND, NABS, no R/G/M MSK: + Bilateral lower extremity peripheral edema with Unna boots in place, moves all extremities independently NEURO: No focal neurological deficit PSYCH: Depressed mood, flat affect Integumentary: No concerning rashes/lesions/wounds noted on exposed skin surfaces    The results of significant diagnostics from this hospitalization (including imaging, microbiology, ancillary and laboratory) are listed below for reference.     Microbiology: Recent Results (from the past 240 hours)  Blood culture (routine x 2)     Status: Abnormal   Collection Time: 02/18/24  8:53 PM   Specimen: BLOOD  Result Value Ref Range Status   Specimen Description BLOOD SITE NOT SPECIFIED  Final   Special Requests   Final    BOTTLES DRAWN AEROBIC AND ANAEROBIC Blood Culture adequate volume   Culture  Setup Time   Final    GRAM POSITIVE COCCI IN CLUSTERS IN BOTH AEROBIC AND ANAEROBIC BOTTLES CRITICAL RESULT CALLED TO, READ BACK BY AND VERIFIED WITH: PHARMD ELIZABETH MARTIN ON 02/20/24 @ 1524 BY DRT    Culture (A)  Final    STAPHYLOCOCCUS COHNII THE SIGNIFICANCE OF ISOLATING THIS ORGANISM FROM A SINGLE SET OF BLOOD CULTURES WHEN MULTIPLE SETS ARE DRAWN IS UNCERTAIN. PLEASE NOTIFY THE MICROBIOLOGY DEPARTMENT WITHIN ONE WEEK IF SPECIATION AND SENSITIVITIES ARE REQUIRED. Performed at [facility], [address], [place], [place] [address]    Report Status 02/22/2024 FINAL  Final  Blood Culture ID Panel (Reflexed)     Status: Abnormal   Collection Time: 02/18/24  8:53 PM  Result Value Ref Range Status   Enterococcus faecalis NOT DETECTED NOT DETECTED Final   Enterococcus Faecium NOT DETECTED NOT DETECTED Final   Listeria monocytogenes NOT DETECTED NOT DETECTED Final   Staphylococcus species DETECTED (A) NOT DETECTED Final     Comment: CRITICAL RESULT CALLED TO, READ BACK BY AND VERIFIED WITH: PHARMD ELIZABETH MARTIN ON 02/20/24 @ 1524 BY DRT    Staphylococcus aureus (BCID) NOT DETECTED NOT DETECTED Final   Staphylococcus  epidermidis NOT DETECTED NOT DETECTED Final   Staphylococcus lugdunensis NOT DETECTED NOT DETECTED Final   Streptococcus species NOT DETECTED NOT DETECTED Final   Streptococcus agalactiae NOT DETECTED NOT DETECTED Final   Streptococcus pneumoniae NOT DETECTED NOT DETECTED Final   Streptococcus pyogenes NOT DETECTED NOT DETECTED Final   A.calcoaceticus-baumannii NOT DETECTED NOT DETECTED Final   Bacteroides fragilis NOT DETECTED NOT DETECTED Final   Enterobacterales NOT DETECTED NOT DETECTED Final   Enterobacter cloacae complex NOT DETECTED NOT DETECTED Final   Escherichia coli NOT DETECTED NOT DETECTED Final   Klebsiella aerogenes NOT DETECTED NOT DETECTED Final   Klebsiella oxytoca NOT DETECTED NOT DETECTED Final   Klebsiella pneumoniae NOT DETECTED NOT DETECTED Final   Proteus species NOT DETECTED NOT DETECTED Final   Salmonella species NOT DETECTED NOT DETECTED Final   Serratia marcescens NOT DETECTED NOT DETECTED Final   Haemophilus influenzae NOT DETECTED NOT DETECTED Final   Neisseria meningitidis NOT DETECTED NOT DETECTED Final   Pseudomonas aeruginosa NOT DETECTED NOT DETECTED Final   Stenotrophomonas maltophilia NOT DETECTED NOT DETECTED Final   Candida albicans NOT DETECTED NOT DETECTED Final   Candida auris NOT DETECTED NOT DETECTED Final   Candida glabrata NOT DETECTED NOT DETECTED Final   Candida krusei NOT DETECTED NOT DETECTED Final   Candida parapsilosis NOT DETECTED NOT DETECTED Final   Candida tropicalis NOT DETECTED NOT DETECTED Final   Cryptococcus neoformans/gattii NOT DETECTED NOT DETECTED Final    Comment: Performed at [facility], [address], [place], [place] [address]  Resp panel by RT-PCR (RSV, Flu A&B, Covid) Anterior Nasal Swab     Status: None    Collection Time: 02/19/24  8:22 AM   Specimen: Anterior Nasal Swab  Result Value Ref Range Status   SARS Coronavirus 2 by RT PCR NEGATIVE NEGATIVE Final   Influenza A by PCR NEGATIVE NEGATIVE Final   Influenza B by PCR NEGATIVE NEGATIVE Final    Comment: (NOTE) The Xpert Xpress SARS-CoV-2/FLU/RSV plus assay is intended as an aid in the diagnosis of influenza from Nasopharyngeal swab specimens and should not be used as a sole basis for treatment. Nasal washings and aspirates are unacceptable for Xpert Xpress SARS-CoV-2/FLU/RSV testing.  Fact Sheet for Patients: BloggerCourse.com  Fact Sheet for Healthcare Providers: SeriousBroker.it  This test is not yet approved or cleared by the [place]  FDA and has been authorized for detection and/or diagnosis of SARS-CoV-2 by FDA under an Emergency Use Authorization (EUA). This EUA will remain in effect (meaning this test can be used) for the duration of the COVID-19 declaration under Section 564(b)(1) of the Act, 21 U.S.C. section 360bbb-3(b)(1), unless the authorization is terminated or revoked.     Resp Syncytial Virus by PCR NEGATIVE NEGATIVE Final    Comment: (NOTE) Fact Sheet for Patients: BloggerCourse.com  Fact Sheet for Healthcare Providers: SeriousBroker.it  This test is not yet approved or cleared by the [place]  FDA and has been authorized for detection and/or diagnosis of SARS-CoV-2 by FDA under an Emergency Use Authorization (EUA). This EUA will remain in effect (meaning this test can be used) for the duration of the COVID-19 declaration under Section 564(b)(1) of the Act, 21 U.S.C. section 360bbb-3(b)(1), unless the authorization is terminated or revoked.  Performed at [facility], [address] [place], [place] [address]      Labs: BNP (last 3 results) Recent Labs    05/21/23 1303 02/18/24 1931  02/20/24 1249  BNP 45.5 67.0 62.7  Basic Metabolic Panel: Recent Labs  Lab 02/18/24 2141 02/19/24 0600 02/20/24 0349 02/21/24 0818 02/22/24 0916 02/23/24 0530 02/24/24 0447 02/25/24 0645  NA  --  136   < > 136 133* 134* 131* 133*  K  --  3.8   < > 4.4 4.4 3.9 4.2 4.3  CL  --  101   < > 98 99 100 100 97*  CO2  --  28   < > 26 26 28 27 26   GLUCOSE  --  153*   < > 96 133* 96 102* 96  BUN  --  10   < > 9 11 14 17 18   CREATININE  --  0.99   < > 0.87 0.91 0.95 0.97 1.00  CALCIUM   --  8.8*   < > 8.9 8.7* 8.7* 8.4* 9.0  MG 2.2 2.0  --   --   --   --   --   --    < > = values in this interval not displayed.   Liver Function Tests: Recent Labs  Lab 02/19/24 0600 02/21/24 0818  AST 17 17  ALT 19 14  ALKPHOS 57 56  BILITOT 0.4 0.6  PROT 7.4 8.1  ALBUMIN 2.6* 2.7*   No results for input(s): LIPASE, AMYLASE in the last 168 hours. No results for input(s): AMMONIA in the last 168 hours. CBC: Recent Labs  Lab 02/18/24 1931 02/19/24 0608 02/21/24 0818  WBC 10.8* 10.1 8.6  NEUTROABS 7.9* 7.7 5.9  HGB 13.4 12.8* 13.8  HCT 42.8 40.6 43.2  MCV 83.4 83.4 82.1  PLT 391 354 334   Cardiac Enzymes: No results for input(s): CKTOTAL, CKMB, CKMBINDEX, TROPONINI in the last 168 hours. BNP: Invalid input(s): POCBNP CBG: No results for input(s): GLUCAP in the last 168 hours. D-Dimer No results for input(s): DDIMER in the last 72 hours. Hgb A1c No results for input(s): HGBA1C in the last 72 hours. Lipid Profile No results for input(s): CHOL, HDL, LDLCALC, TRIG, CHOLHDL, LDLDIRECT in the last 72 hours. Thyroid function studies No results for input(s): TSH, T4TOTAL, T3FREE, THYROIDAB in the last 72 hours.  Invalid input(s): FREET3 Anemia work up No results for input(s): VITAMINB12, FOLATE, FERRITIN, TIBC, IRON, RETICCTPCT in the last 72 hours. Urinalysis    Component Value Date/Time   COLORURINE YELLOW 02/19/2024 0422    APPEARANCEUR CLEAR 02/19/2024 0422   LABSPEC 1.032 (H) 02/19/2024 0422   PHURINE 5.0 02/19/2024 0422   GLUCOSEU NEGATIVE 02/19/2024 0422   HGBUR NEGATIVE 02/19/2024 0422   BILIRUBINUR NEGATIVE 02/19/2024 0422   KETONESUR NEGATIVE 02/19/2024 0422   PROTEINUR NEGATIVE 02/19/2024 0422   NITRITE NEGATIVE 02/19/2024 0422   LEUKOCYTESUR NEGATIVE 02/19/2024 0422   Sepsis Labs Recent Labs  Lab 02/18/24 1931 02/19/24 0608 02/21/24 0818  WBC 10.8* 10.1 8.6   Microbiology Recent Results (from the past 240 hours)  Blood culture (routine x 2)     Status: Abnormal   Collection Time: 02/18/24  8:53 PM   Specimen: BLOOD  Result Value Ref Range Status   Specimen Description BLOOD SITE NOT SPECIFIED  Final   Special Requests   Final    BOTTLES DRAWN AEROBIC AND ANAEROBIC Blood Culture adequate volume   Culture  Setup Time   Final    GRAM POSITIVE COCCI IN CLUSTERS IN BOTH AEROBIC AND ANAEROBIC BOTTLES CRITICAL RESULT CALLED TO, READ BACK BY AND VERIFIED WITH: PHARMD ELIZABETH MARTIN ON 02/20/24 @ 1524 BY DRT    Culture (A)  Final  STAPHYLOCOCCUS COHNII THE SIGNIFICANCE OF ISOLATING THIS ORGANISM FROM A SINGLE SET OF BLOOD CULTURES WHEN MULTIPLE SETS ARE DRAWN IS UNCERTAIN. PLEASE NOTIFY THE MICROBIOLOGY DEPARTMENT WITHIN ONE WEEK IF SPECIATION AND SENSITIVITIES ARE REQUIRED. Performed at [facility], [address], [place], [place] [address]    Report Status 02/22/2024 FINAL  Final  Blood Culture ID Panel (Reflexed)     Status: Abnormal   Collection Time: 02/18/24  8:53 PM  Result Value Ref Range Status   Enterococcus faecalis NOT DETECTED NOT DETECTED Final   Enterococcus Faecium NOT DETECTED NOT DETECTED Final   Listeria monocytogenes NOT DETECTED NOT DETECTED Final   Staphylococcus species DETECTED (A) NOT DETECTED Final    Comment: CRITICAL RESULT CALLED TO, READ BACK BY AND VERIFIED WITH: PHARMD ELIZABETH MARTIN ON 02/20/24 @ 1524 BY DRT    Staphylococcus aureus (BCID) NOT  DETECTED NOT DETECTED Final   Staphylococcus epidermidis NOT DETECTED NOT DETECTED Final   Staphylococcus lugdunensis NOT DETECTED NOT DETECTED Final   Streptococcus species NOT DETECTED NOT DETECTED Final   Streptococcus agalactiae NOT DETECTED NOT DETECTED Final   Streptococcus pneumoniae NOT DETECTED NOT DETECTED Final   Streptococcus pyogenes NOT DETECTED NOT DETECTED Final   A.calcoaceticus-baumannii NOT DETECTED NOT DETECTED Final   Bacteroides fragilis NOT DETECTED NOT DETECTED Final   Enterobacterales NOT DETECTED NOT DETECTED Final   Enterobacter cloacae complex NOT DETECTED NOT DETECTED Final   Escherichia coli NOT DETECTED NOT DETECTED Final   Klebsiella aerogenes NOT DETECTED NOT DETECTED Final   Klebsiella oxytoca NOT DETECTED NOT DETECTED Final   Klebsiella pneumoniae NOT DETECTED NOT DETECTED Final   Proteus species NOT DETECTED NOT DETECTED Final   Salmonella species NOT DETECTED NOT DETECTED Final   Serratia marcescens NOT DETECTED NOT DETECTED Final   Haemophilus influenzae NOT DETECTED NOT DETECTED Final   Neisseria meningitidis NOT DETECTED NOT DETECTED Final   Pseudomonas aeruginosa NOT DETECTED NOT DETECTED Final   Stenotrophomonas maltophilia NOT DETECTED NOT DETECTED Final   Candida albicans NOT DETECTED NOT DETECTED Final   Candida auris NOT DETECTED NOT DETECTED Final   Candida glabrata NOT DETECTED NOT DETECTED Final   Candida krusei NOT DETECTED NOT DETECTED Final   Candida parapsilosis NOT DETECTED NOT DETECTED Final   Candida tropicalis NOT DETECTED NOT DETECTED Final   Cryptococcus neoformans/gattii NOT DETECTED NOT DETECTED Final    Comment: Performed at [facility], [address] [place], [place] [address]  Resp panel by RT-PCR (RSV, Flu A&B, Covid) Anterior Nasal Swab     Status: None   Collection Time: 02/19/24  8:22 AM   Specimen: Anterior Nasal Swab  Result Value Ref Range Status   SARS Coronavirus 2 by RT PCR NEGATIVE NEGATIVE Final    Influenza A by PCR NEGATIVE NEGATIVE Final   Influenza B by PCR NEGATIVE NEGATIVE Final    Comment: (NOTE) The Xpert Xpress SARS-CoV-2/FLU/RSV plus assay is intended as an aid in the diagnosis of influenza from Nasopharyngeal swab specimens and should not be used as a sole basis for treatment. Nasal washings and aspirates are unacceptable for Xpert Xpress SARS-CoV-2/FLU/RSV testing.  Fact Sheet for Patients: BloggerCourse.com  Fact Sheet for Healthcare Providers: SeriousBroker.it  This test is not yet approved or cleared by the [place]  FDA and has been authorized for detection and/or diagnosis of SARS-CoV-2 by FDA under an Emergency Use Authorization (EUA). This EUA will remain in effect (meaning this test can be used) for the duration of the  COVID-19 declaration under Section 564(b)(1) of the Act, 21 U.S.C. section 360bbb-3(b)(1), unless the authorization is terminated or revoked.     Resp Syncytial Virus by PCR NEGATIVE NEGATIVE Final    Comment: (NOTE) Fact Sheet for Patients: BloggerCourse.com  Fact Sheet for Healthcare Providers: SeriousBroker.it  This test is not yet approved or cleared by the [place]  FDA and has been authorized for detection and/or diagnosis of SARS-CoV-2 by FDA under an Emergency Use Authorization (EUA). This EUA will remain in effect (meaning this test can be used) for the duration of the COVID-19 declaration under Section 564(b)(1) of the Act, 21 U.S.C. section 360bbb-3(b)(1), unless the authorization is terminated or revoked.  Performed at [facility], [address], [place], [place] [address]      Time coordinating discharge: Over 30 minutes  SIGNED:   Camellia PARAS [place], DO  [facility] 02/25/2024, 9:25 AM

## 2024-02-25 NOTE — Progress Notes (Signed)
 Wound care instruction given to the patient. Patient verbalized understanding.  Loyce Blazing, RN

## 2024-02-25 NOTE — TOC Progression Note (Addendum)
 Transition of Care [facility]) - Progression Note    Patient Details  Name: Alex [name] MRN: [id number] Date of Birth: Jun 10, 1971  Transition of Care [facility]) CM/SW Contact  Graves-Bigelow, Erminio Deems, RN Phone Number: 02/25/2024, 3:03 PM  Clinical Narrative: Case Manager has spoken with patient regarding home health. Patient has not had a PCP visit with Dr. Hargis Cesar Le since 2023. Per [facility] letters and phone calls have been sent out to the patient. Per CSW, she is unaware if the provider will continue to write or sign orders for this patient- oxygen, outpatient sleep study, home health and wound care follow up. Case Manager discussed with patient to see if scheduling could take place before discharge. Patient wants to schedule based on roommate availability for transportation.  Case Manager did provide the patient with a number to the [place] to establish a visit at [phone]. If roommates cannot assist with transportation patient will ask the  [facility] to provide community transportation. Patient will not have any home health services at this time since the PCP has to provide authorization of services. MD and staff RN aware aware. Staff RN is to educate the patient on wound care before he is discharged-supplies to be provided. Case Manager will fax the discharge summary to the provider at [phone]. Case Manager is awaiting updates from [facility] regarding oxygen referral [facility]. Jon with the oxygen supply at [facility] sent a referral to [facility] for an emergency delivery to the patients home. Awaiting call back from [facility]. MD states that confirmation regarding oxygen is needed before the patient is discharged. No further needs identified at this time.   8467 02-25-24 Case Manager has spoken with [facility] and the patient is on the list; however, the technician will need to call the patient to see if the delivery will be completed by 8:00 pm. If the  delivery can be done-patient knows to let the staff RN know and he can discharge. Patient will need a taxi voucher home once we have confirmation of the delivery of the oxygen to his home. No further needs identified.   8366 02-25-24 Case Manager just received confirmation from the [facility] technician [place]-he will be at the patients home within the hour for oxygen delivery. Patient will be transported home via cab. No further needs identified at this time.   Expected Discharge Plan: Home w Home Health Services Barriers to Discharge: No Barriers Identified  Expected Discharge Plan and Services   Discharge Planning Services: CM Consult Post Acute Care Choice: Home Health Living arrangements for the past 2 months: Single Family Home Expected Discharge Date: 02/25/24               DME Arranged: Oxygen DME Agency: Other - Comment (Order emailed to [employer] .GOV) Date DME Agency Contacted: 02/25/24 Time DME Agency Contacted: 8965 Representative spoke with at DME Agency: EMAILED TO [email or web address] HH Arranged: RN, Disease Management, PT, OT HH Agency: [facility] Date [facility] Agency Contacted: 02/20/24 Time HH Agency Contacted: 1545 Representative spoke with at [facility] Agency: Channing   Social Determinants of Health (SDOH) Interventions SDOH Screenings   Food Insecurity: Food Insecurity Present (02/19/2024)  Housing: Unknown (02/19/2024)  Transportation Needs: No Transportation Needs (02/19/2024)  Utilities: Not At Risk (02/19/2024)  Depression (PHQ2-9): Medium Risk (02/10/2022)  Tobacco Use: High Risk (02/18/2024)    Readmission Risk Interventions    11/22/2021   11:28 AM  Readmission Risk Prevention Plan  Post Dischage  Appt Not Complete  Appt Comments Patient needs to send financials to [facility] to get PCP assigned, he is aware of this and verbalizes agreement to do so  Medication Screening Complete  Transportation Screening Complete

## 2024-02-25 NOTE — TOC Transition Note (Addendum)
 Transition of Care [facility]) - Discharge Note   Patient Details  Name: Alex [name] MRN: [id number] Date of Birth: 1971-09-02  [facility] Care [facility]) CM/SW Contact:  Robynn Eileen Hoose, RN Phone Number: 02/25/2024, 10:35 AM   Clinical Narrative:   Patient is being discharged today. Home O2 for night time use needed. Orders and noted secure emailed to [email or web address]. Channing with [facility] made aware, they are following but patient will need to go to [facility] PCP before they will give them the Auth to see him.  1200: Per [facility], pt has not been seen by PCP since 2023. Pt must be seen by PCP before authorizing home oxygen. Provider made aware via secure chat.  1206: Call received from April with the [facility] oxygen department. She is trying to work with a provider to sign the order for home oxygen. She will call back to give update once she hears back.   Final next level of care: Home w Home Health Services Barriers to Discharge: No Barriers Identified   Patient Goals and CMS Choice Patient states their goals for this hospitalization and ongoing recovery are:: patient will return home with roommates.   Choice offered to / list presented to : Patient (Patient did not have a preference-just to be in network with the [facility])      Discharge Placement                       Discharge Plan and Services Additional resources added to the After Visit Summary for     Discharge Planning Services: CM Consult Post Acute Care Choice: Home Health          DME Arranged: Oxygen DME Agency: Other - Comment (Order emailed to [employer] .GOV) Date DME Agency Contacted: 02/25/24 Time DME Agency Contacted: 1034 Representative spoke with at DME Agency: EMAILED TO [facility] HH Arranged: RN, Disease Management, PT, OT HH Agency: [facility] Date [facility] Agency Contacted: 02/20/24 Time HH Agency Contacted: 1545 Representative spoke with at [facility] Agency: Channing  Social  Drivers of Health (SDOH) Interventions SDOH Screenings   Food Insecurity: Food Insecurity Present (02/19/2024)  Housing: Unknown (02/19/2024)  Transportation Needs: No Transportation Needs (02/19/2024)  Utilities: Not At Risk (02/19/2024)  Depression (PHQ2-9): Medium Risk (02/10/2022)  Tobacco Use: High Risk (02/18/2024)     Readmission Risk Interventions    11/22/2021   11:28 AM  Readmission Risk Prevention Plan  Post Dischage Appt Not Complete  Appt Comments Patient needs to send financials to [facility] to get PCP assigned, he is aware of this and verbalizes agreement to do so  Medication Screening Complete  Transportation Screening Complete

## 2024-03-02 NOTE — Progress Notes (Deleted)
 Cardiology Office Note   Date:  03/02/2024  ID:  Alex [name], DOB 1970/10/23, MRN [id number] PCP: [facility] Providers Cardiologist:  Stanly DELENA Leavens, MD   History of Present Illness Alex [name] is a 53 y.o. male with a past medical history of HTN, history prior stroke, COPD, HFpEF, coronary artery calcifications. Recently established care with Dr. Leavens and presents today for a hospital follow up appointment   Patient recently admitted 6/16-6/23 with generalized weakness, acute respiratory failure with hypoxia, CHF. Presented 6/16 complaining of leg swelling. His lifestyle is marked by social isolated, does not leave his room much. Admitted to being depressed, denied suicidal ideation. CTA chest showed no PE but did note moderate 3-vessel coronary atherosclerosis. He had some shortness of breath and orthopnea. Also noted lower extremity swelling. He underwent echocardiogram 6/20 that showed EF 60-65%, no regional wall motion abnormalities, moderate LVH, grade I DD, normal RV systolic function, mild dilation of the ascending aorta measuring 40 mm. Patient was treated with IV lasix . BP significantly elevated. Discharged on amlodipine  10 mg daily, ASA 81 mg daily, lasix  80 mg daily, hydralazine  100 mg TID, crestor  20 mg daily, entresto  97-103 mg BID, spironolactone  25 mg daily.   Chronic Diastolic Heart Failure  HTN - Echocardiogram 02/2024 showed EF 60-65%, no regional wall motion abnormalities, moderate LVH, grade I DD, normal RV systolic function, mild dilation of the ascending aorta measuring 40 mm -  - Continue lasix  80 mg daily  - Continue hydralazine  100 mg TID, amlodipine  10 mg daily, entresto  97--103 mg BID, spironolactone  25 mg daily - Ordered BMP for medication monitoring   Coronary Artery Calcifications  - CTA Chest 02/2024 noted moderate coronary artery atherosclerosis  -  - Continue ASA 81 mg daily and crestor  20 mg daily    COPD   Ascending/aortic root dilation  - Echo 02/2024 noted mild dilatation of the ascending aorta measuring 40 mm, mild dilatation of the aortic root measuring 41 mm  -   History of CVA  - Continue ASA 81 mg daily and crestor  20 mg daily   HLD  - Lipid panel from 02/2024 showed LDL 98, HDL 30  - Continue crestor  20 mg daily - restarted during recent admission  - Recheck lipid panel and LFTs in 2 months   Suspected OSA  - During recent admission, patient was noted to have episodes of apnea with bradycardia, hypoxia on telemetry  - Ordered outpatient sleep study   ROS: ***  Studies Reviewed      *** Risk Assessment/Calculations {Does this patient have ATRIAL FIBRILLATION?:[phone]} No BP recorded.  {Refresh Note OR Click here to enter BP  :1}***       Physical Exam VS:  There were no vitals taken for this visit.       Wt Readings from Last 3 Encounters:  02/25/24 275 lb 4.8 oz (124.9 kg)  05/21/23 283 lb (128.4 kg)  12/09/22 235 lb (106.6 kg)    GEN: Well nourished, well developed in no acute distress NECK: No JVD; No carotid bruits CARDIAC: ***RRR, no murmurs, rubs, gallops RESPIRATORY:  Clear to auscultation without rales, wheezing or rhonchi  ABDOMEN: Soft, non-tender, non-distended EXTREMITIES:  No edema; No deformity   ASSESSMENT AND PLAN ***    {Are you ordering a CV Procedure (e.g. stress test, cath, DCCV, TEE, etc)?   Press F2        :[id number]}  Dispo: ***  Signed, Rollo FABIENE Louder, PA-C

## 2024-03-04 ENCOUNTER — Ambulatory Visit: Attending: Cardiology | Admitting: Cardiology
